# Patient Record
Sex: Female | Born: 1935
Health system: Southern US, Community
[De-identification: ages and names within clinical notes are randomized; demographics above are authoritative.]

## PROBLEM LIST (undated history)

## (undated) DIAGNOSIS — E079 Disorder of thyroid, unspecified: Secondary | ICD-10-CM

## (undated) DIAGNOSIS — I10 Essential (primary) hypertension: Secondary | ICD-10-CM

## (undated) DIAGNOSIS — R569 Unspecified convulsions: Secondary | ICD-10-CM

## (undated) DIAGNOSIS — K219 Gastro-esophageal reflux disease without esophagitis: Secondary | ICD-10-CM

## (undated) HISTORY — PX: ABDOMINAL HYSTERECTOMY: SHX81

## (undated) HISTORY — PX: GALLBLADDER SURGERY: SHX652

## (undated) HISTORY — DX: Gastro-esophageal reflux disease without esophagitis: K21.9

## (undated) HISTORY — DX: Disorder of thyroid, unspecified: E07.9

---

## 2001-06-10 ENCOUNTER — Emergency Department (HOSPITAL_COMMUNITY): Admission: EM | Admit: 2001-06-10 | Discharge: 2001-06-10 | Payer: Self-pay | Admitting: Emergency Medicine

## 2001-06-10 ENCOUNTER — Encounter: Payer: Self-pay | Admitting: Emergency Medicine

## 2015-01-19 ENCOUNTER — Ambulatory Visit (INDEPENDENT_AMBULATORY_CARE_PROVIDER_SITE_OTHER): Payer: PPO | Admitting: Family Medicine

## 2015-01-19 ENCOUNTER — Encounter: Payer: Self-pay | Admitting: Family Medicine

## 2015-01-19 VITALS — BP 151/77 | HR 79 | Temp 97.0°F | Ht 59.0 in | Wt 155.0 lb

## 2015-01-19 DIAGNOSIS — I10 Essential (primary) hypertension: Secondary | ICD-10-CM | POA: Diagnosis not present

## 2015-01-19 DIAGNOSIS — R7303 Prediabetes: Secondary | ICD-10-CM

## 2015-01-19 DIAGNOSIS — K2100 Gastro-esophageal reflux disease with esophagitis, without bleeding: Secondary | ICD-10-CM

## 2015-01-19 DIAGNOSIS — E039 Hypothyroidism, unspecified: Secondary | ICD-10-CM

## 2015-01-19 DIAGNOSIS — M545 Low back pain: Secondary | ICD-10-CM | POA: Diagnosis not present

## 2015-01-19 DIAGNOSIS — R7309 Other abnormal glucose: Secondary | ICD-10-CM

## 2015-01-19 LAB — POCT CBC
GRANULOCYTE PERCENT: 63.6 % (ref 37–80)
HCT, POC: 45 % (ref 37.7–47.9)
Hemoglobin: 14.1 g/dL (ref 12.2–16.2)
LYMPH, POC: 2.6 (ref 0.6–3.4)
MCH: 28 pg (ref 27–31.2)
MCHC: 31.3 g/dL — AB (ref 31.8–35.4)
MCV: 89.4 fL (ref 80–97)
MPV: 7.9 fL (ref 0–99.8)
PLATELET COUNT, POC: 301 10*3/uL (ref 142–424)
POC Granulocyte: 6 (ref 2–6.9)
POC LYMPH %: 27.6 % (ref 10–50)
RBC: 5.04 M/uL (ref 4.04–5.48)
RDW, POC: 14.4 %
WBC: 9.5 10*3/uL (ref 4.6–10.2)

## 2015-01-19 LAB — POCT GLYCOSYLATED HEMOGLOBIN (HGB A1C)

## 2015-01-19 MED ORDER — LEVOTHYROXINE SODIUM 50 MCG PO TABS: 50.0000 ug | ORAL_TABLET | Freq: Every day | ORAL | Status: DC

## 2015-01-19 MED ORDER — CYCLOBENZAPRINE HCL 10 MG PO TABS: 10.0000 mg | ORAL_TABLET | Freq: Three times a day (TID) | ORAL | Status: DC | PRN

## 2015-01-19 MED ORDER — SPIRONOLACTONE-HCTZ 25-25 MG PO TABS: 1.0000 | ORAL_TABLET | Freq: Every day | ORAL | Status: DC

## 2015-01-19 MED ORDER — ESOMEPRAZOLE MAGNESIUM 40 MG PO CPDR: 40.0000 mg | DELAYED_RELEASE_CAPSULE | Freq: Every day | ORAL | Status: DC

## 2015-01-19 NOTE — Progress Notes (Signed)
Subjective:  Patient ID: Samantha Woods, female    DOB: 03-30-1921  Age: 81 y.o. MRN: [id number]  CC: Establish Care   HPI Rease Swinson presents for Patient presents for follow-up on  thyroid. She has a history of hypothyroidism for many years. It has been stable recently. Pt. denies any change in  voice, loss of hair, heat or cold intolerance. Energy level has been adequate to good. She denies constipation and diarrhea. No myxedema. Medication is as noted below. Verified that pt is taking it daily on an empty stomach. Well tolerated.  Patient in for follow-up of hypertension. Patient has no history of headache chest pain or shortness of breath or recent cough. Patient also denies symptoms of TIA such as numbness weakness lateralizing. Patient checks  blood pressure at home and has not had any elevated readings recently. Patient denies side effects from his medication. States taking it regularly.  Patient in for follow-up of GERD.  currently asymptomatic taking  PPI daily. There is no chest pain or heartburn. taking the medication regularly. No dysphagia or choking.  Patient has a history of arthritis in the lower back. She has moderate to severe pain for which she has used opiates in the past.  Patient has been told that blood sugar was borderline in the past. Not high enough for medication but not in the normal range. She denies polyuria polydipsia.   History Smita has no past medical history on file.   She has no past surgical history on file.   Her family history is not on file.She reports that she has never smoked. She does not have any smokeless tobacco history on file. She reports that she drinks alcohol. She reports that she does not use illicit drugs.  No current outpatient prescriptions on file prior to visit.   No current facility-administered medications on file prior to visit.    ROS Review of Systems  Objective:  BP 151/77 mmHg  Pulse 79  Temp(Src) 97 F (36.1  C) (Oral)  Ht _0  (1.499 m)  Wt 155 lb (70.308 kg)  BMI 31.29 kg/m2  BP Readings from Last 3 Encounters:  01/19/15 151/77    Wt Readings from Last 3 Encounters:  01/19/15 155 lb (70.308 kg)     Physical Exam  Lab Results  Component Value Date   HGBA1C 5.7% 01/19/2015    Lab Results  Component Value Date   WBC 9.5 01/19/2015   HGB 14.1 01/19/2015   HCT 45.0 01/19/2015   GLUCOSE 94 01/19/2015   CHOL 179 01/19/2015   TRIG 137 01/19/2015   HDL 46 01/19/2015   LDLCALC 106* 01/19/2015   ALT 12 01/19/2015   AST 21 01/19/2015   NA 141 01/19/2015   K 4.2 01/19/2015   CL 97 01/19/2015   CREATININE 0.89 01/19/2015   BUN 21 01/19/2015   CO2 27 01/19/2015   TSH 4.770* 01/19/2015   HGBA1C 5.7% 01/19/2015    Dg Chest 2 View  06/10/2001   FINDINGS CLINICAL DATA:  CHEST PAIN/COUGH. TWO VIEW CHEST NO PRIOR EXAMS FOR COMPARISON.  HEART SLIGHTLY ENLARGED WITH A SOMEWHAT PROMINENT LEFT VENTRICULAR CONTOUR AND TORTUOUS AORTA.  NO CONGESTIVE HEART FAILURE OR ACTIVE DISEASE.  LUNGS CLEAR.  THERE MAY BE A MILD DEGREE OF HYPERAERATION RAISING THE QUESTION OF EARLY COPD. AN ADDITIONAL FINDING IS A SMALL CALCIFIED GRANULOMA IN THE LEFT UPPER LOBE.  OSSEOUS STRUCTURES INTACT WITH BONY DEMINERALIZATION CONSISTENT WITH THE PATIENT'S AGE. IMPRESSION 1.  PROMINENT LEFT VENTRICLE AND  AORTA - NO ACTIVE DISEASE. 2.  CHRONIC LUNG CHANGES AS DESCRIBED ABOVE.   Assessment & Plan:   Leanora was seen today for establish care.  Diagnoses and all orders for this visit:  Hypothyroidism, unspecified hypothyroidism type Orders: -     CMP14+EGFR -     TSH -     POCT CBC -     T4, Free  Gastroesophageal reflux disease with esophagitis Orders: -     CMP14+EGFR -     POCT CBC  Essential hypertension Orders: -     CMP14+EGFR -     POCT CBC  Low back pain without sciatica, unspecified back pain laterality Orders: -     CMP14+EGFR -     POCT CBC  Prediabetes Orders: -     CMP14+EGFR -      Lipid panel -     POCT CBC -     POCT glycosylated hemoglobin (Hb A1C)  Other orders -     cyclobenzaprine (FLEXERIL) 10 MG tablet; Take 1 tablet (10 mg total) by mouth 3 (three) times daily as needed for muscle spasms. -     esomeprazole (NEXIUM) 40 MG capsule; Take 1 capsule (40 mg total) by mouth daily at 12 noon. -     Discontinue: levothyroxine (SYNTHROID, LEVOTHROID) 50 MCG tablet; Take 1 tablet (50 mcg total) by mouth daily before breakfast. -     spironolactone-hydrochlorothiazide (ALDACTAZIDE) 25-25 MG per tablet; Take 1 tablet by mouth daily.   I have discontinued Ms. Caccavale levothyroxine. I have also changed her cyclobenzaprine and esomeprazole. Additionally, I am having her maintain her HYDROcodone-acetaminophen, docusate sodium, Fish Oil, aspirin EC, loratadine, Vitamin D, acetaminophen, and spironolactone-hydrochlorothiazide.  Meds ordered this encounter  Medications  . DISCONTD: cyclobenzaprine (FLEXERIL) 10 MG tablet    Sig: Take 10 mg by mouth 3 (three) times daily as needed for muscle spasms.  Marland Kitchen HYDROcodone-acetaminophen (NORCO/VICODIN) 5-325 MG per tablet    Sig: Take 1 tablet by mouth every 6 (six) hours as needed for moderate pain.  Marland Kitchen docusate sodium (COLACE) 100 MG capsule    Sig: Take 100 mg by mouth 2 (two) times daily.  Marland Kitchen DISCONTD: esomeprazole (NEXIUM) 40 MG capsule    Sig: Take 40 mg by mouth daily at 12 noon.  Marland Kitchen DISCONTD: spironolactone-hydrochlorothiazide (ALDACTAZIDE) 25-25 MG per tablet    Sig: Take 1 tablet by mouth daily.  . Omega-3 Fatty Acids (FISH OIL) 1000 MG CAPS    Sig: Take 1 capsule by mouth daily.  Marland Kitchen aspirin EC 81 MG tablet    Sig: Take 81 mg by mouth daily.  Marland Kitchen DISCONTD: levothyroxine (SYNTHROID, LEVOTHROID) 50 MCG tablet    Sig: Take 50 mcg by mouth daily before breakfast.  . loratadine (CLARITIN) 10 MG tablet    Sig: Take 10 mg by mouth daily.  . Cholecalciferol (VITAMIN D) 2000 UNITS CAPS    Sig: Take 1 capsule by mouth.  Marland Kitchen  acetaminophen (TYLENOL) 325 MG tablet    Sig: Take 650 mg by mouth every 4 (four) hours as needed.  . cyclobenzaprine (FLEXERIL) 10 MG tablet    Sig: Take 1 tablet (10 mg total) by mouth 3 (three) times daily as needed for muscle spasms.    Dispense:  90 tablet    Refill:  1  . esomeprazole (NEXIUM) 40 MG capsule    Sig: Take 1 capsule (40 mg total) by mouth daily at 12 noon.    Dispense:  90 capsule    Refill:  3  . DISCONTD: levothyroxine (SYNTHROID, LEVOTHROID) 50 MCG tablet    Sig: Take 1 tablet (50 mcg total) by mouth daily before breakfast.    Dispense:  90 tablet    Refill:  3  . spironolactone-hydrochlorothiazide (ALDACTAZIDE) 25-25 MG per tablet    Sig: Take 1 tablet by mouth daily.    Dispense:  90 tablet    Refill:  3    Follow-up: Return in about 3 months (around 04/19/2015).  Claretta Fraise, M.D.

## 2015-01-20 LAB — LIPID PANEL
Chol/HDL Ratio: 3.9 ratio units (ref 0.0–4.4)
Cholesterol, Total: 179 mg/dL (ref 100–199)
HDL: 46 mg/dL (ref >39)
LDL Calculated: 106 mg/dL — ABNORMAL HIGH (ref 0–99)
TRIGLYCERIDES: 137 mg/dL (ref 0–149)
VLDL CHOLESTEROL CAL: 27 mg/dL (ref 5–40)

## 2015-01-20 LAB — CMP14+EGFR
A/G RATIO: 1.9 (ref 1.1–2.5)
ALBUMIN: 4.4 g/dL (ref 3.2–4.6)
ALT: 12 IU/L (ref 0–32)
AST: 21 IU/L (ref 0–40)
Alkaline Phosphatase: 79 IU/L (ref 39–117)
BILIRUBIN TOTAL: 0.9 mg/dL (ref 0.0–1.2)
BUN/Creatinine Ratio: 24 (ref 11–26)
BUN: 21 mg/dL (ref 10–36)
CO2: 27 mmol/L (ref 18–29)
Calcium: 10 mg/dL (ref 8.7–10.3)
Chloride: 97 mmol/L (ref 97–108)
Creatinine, Ser: 0.89 mg/dL (ref 0.57–1.00)
GFR, EST AFRICAN AMERICAN: 65 mL/min/{1.73_m2} (ref >59)
GFR, EST NON AFRICAN AMERICAN: 56 mL/min/{1.73_m2} — AB (ref >59)
GLUCOSE: 94 mg/dL (ref 65–99)
Globulin, Total: 2.3 g/dL (ref 1.5–4.5)
POTASSIUM: 4.2 mmol/L (ref 3.5–5.2)
Sodium: 141 mmol/L (ref 134–144)
TOTAL PROTEIN: 6.7 g/dL (ref 6.0–8.5)

## 2015-01-20 LAB — T4, FREE: Free T4: 1.24 ng/dL (ref 0.82–1.77)

## 2015-01-20 LAB — TSH: TSH: 4.77 u[IU]/mL — AB (ref 0.450–4.500)

## 2015-01-21 ENCOUNTER — Other Ambulatory Visit: Payer: Self-pay | Admitting: Family Medicine

## 2015-01-21 MED ORDER — LEVOTHYROXINE SODIUM 75 MCG PO TABS: 75.0000 ug | ORAL_TABLET | Freq: Every day | ORAL | Status: DC

## 2015-01-23 DIAGNOSIS — R7303 Prediabetes: Secondary | ICD-10-CM | POA: Insufficient documentation

## 2015-01-23 DIAGNOSIS — I10 Essential (primary) hypertension: Secondary | ICD-10-CM | POA: Insufficient documentation

## 2015-01-23 DIAGNOSIS — K2100 Gastro-esophageal reflux disease with esophagitis, without bleeding: Secondary | ICD-10-CM | POA: Insufficient documentation

## 2015-01-23 DIAGNOSIS — M479 Spondylosis, unspecified: Secondary | ICD-10-CM | POA: Insufficient documentation

## 2015-01-23 DIAGNOSIS — E039 Hypothyroidism, unspecified: Secondary | ICD-10-CM | POA: Insufficient documentation

## 2015-03-25 ENCOUNTER — Telehealth: Payer: Self-pay | Admitting: Family Medicine

## 2015-03-25 MED ORDER — LEVOTHYROXINE SODIUM 75 MCG PO TABS: 75.0000 ug | ORAL_TABLET | Freq: Every day | ORAL | Status: DC

## 2015-03-25 NOTE — Telephone Encounter (Signed)
Prescription sent. Thanks, WS.

## 2015-03-28 ENCOUNTER — Other Ambulatory Visit: Payer: Self-pay

## 2015-03-28 MED ORDER — LEVOTHYROXINE SODIUM 75 MCG PO TABS: 75.0000 ug | ORAL_TABLET | Freq: Every day | ORAL | Status: DC

## 2015-03-28 NOTE — Telephone Encounter (Signed)
Detailed message left that rx sent to pharmacy. ?

## 2015-05-06 ENCOUNTER — Encounter: Payer: Self-pay | Admitting: *Deleted

## 2015-06-20 ENCOUNTER — Encounter: Payer: Self-pay | Admitting: Family

## 2015-06-20 ENCOUNTER — Encounter (INDEPENDENT_AMBULATORY_CARE_PROVIDER_SITE_OTHER): Payer: Self-pay

## 2015-06-20 ENCOUNTER — Ambulatory Visit (INDEPENDENT_AMBULATORY_CARE_PROVIDER_SITE_OTHER): Payer: PPO

## 2015-06-20 ENCOUNTER — Ambulatory Visit (INDEPENDENT_AMBULATORY_CARE_PROVIDER_SITE_OTHER): Payer: PPO | Admitting: Family

## 2015-06-20 VITALS — BP 143/73 | HR 71 | Temp 97.1°F | Ht 59.0 in | Wt 149.0 lb

## 2015-06-20 DIAGNOSIS — S92911A Unspecified fracture of right toe(s), initial encounter for closed fracture: Secondary | ICD-10-CM

## 2015-06-20 DIAGNOSIS — M79674 Pain in right toe(s): Secondary | ICD-10-CM | POA: Diagnosis not present

## 2015-06-20 NOTE — Progress Notes (Signed)
   Subjective:    Patient ID: Samantha Woods, female    DOB: March 26, 1921, 81 y.o.   MRN: [id number]  HPI Pt presents to the office today for pain in her right second toe. Pt states she dropped a can of food on her toe about two week ago. Pt states her toe became very red and swollen. Pt denies any pain. Pt has soaked it in epsom salt with mild relief.     Review of Systems  Constitutional: Negative.   HENT: Negative.   Eyes: Negative.   Respiratory: Negative.  Negative for shortness of breath.   Cardiovascular: Negative.  Negative for palpitations.  Gastrointestinal: Negative.   Endocrine: Negative.   Genitourinary: Negative.   Musculoskeletal: Negative.   Neurological: Negative.  Negative for headaches.  Hematological: Negative.   Psychiatric/Behavioral: Negative.   All other systems reviewed and are negative.      Objective:   Physical Exam  Constitutional: She is oriented to person, place, and time. She appears well-developed and well-nourished. No distress.  HENT:  Head: Normocephalic and atraumatic.  Eyes: Pupils are equal, round, and reactive to light.  Neck: Normal range of motion. Neck supple. No thyromegaly present.  Cardiovascular: Normal rate, regular rhythm, normal heart sounds and intact distal pulses.   No murmur heard. Pulmonary/Chest: Effort normal and breath sounds normal. No respiratory distress. She has no wheezes.  Abdominal: Soft. Bowel sounds are normal. She exhibits no distension. There is no tenderness.  Musculoskeletal: Normal range of motion. She exhibits edema (mild swelling of second right toe). She exhibits no tenderness.  Neurological: She is alert and oriented to person, place, and time. She has normal reflexes. No cranial nerve deficit.  Skin: Skin is warm and dry.  Psychiatric: She has a normal mood and affect. Her behavior is normal. Judgment and thought content normal.  Vitals reviewed.   BP 143/73 mmHg  Pulse 71  Temp(Src) 97.1 F (36.2  C) (Oral)  Ht 4\' 11"  (1.499 m)  Wt 149 lb (67.586 kg)  BMI 30.08 kg/m2  X-ray-Fracture of second right toe Preliminary reading by Evelina Dun, FNP [facility]      Assessment & Plan:  1. Pain of toe of right foot - DG Toe 2nd Right; Future  2. Fracture, toe, right, closed, initial encounter -Second toe buddy taped to third toe-Gauze placed in between -Keep elevated -Discussed checking circulation- Not too tight for buddy taping -Tylenol prn for pain -RTO prn  Evelina Dun, FNP

## 2015-06-20 NOTE — Patient Instructions (Signed)
Toe Fracture Your caregiver has diagnosed you as having a fractured toe. A toe fracture is a break in the bone of a toe. "Buddy taping" is a way of splinting your broken toe, by taping the broken toe to the toe next to it. This "buddy taping" will keep the injured toe from moving beyond normal range of motion. Buddy taping also helps the toe heal in a more normal alignment. It may take 6 to 8 weeks for the toe injury to heal. [place] your toes taped together for as long as directed by your caregiver or until you see a doctor for a follow-up examination. You can change the tape after bathing. Always use a small piece of gauze or cotton between the toes when taping them together. This will help the skin stay dry and prevent infection.  Apply ice to the injury for 15-20 minutes each hour while awake for the first 2 days. Put the ice in a plastic bag and place a towel between the bag of ice and your skin.  After the first 2 days, apply heat to the injured area. Use heat for the next 2 to 3 days. Place a heating pad on the foot or soak the foot in warm water as directed by your caregiver.  Keep your foot elevated as much as possible to lessen swelling.  Wear sturdy, supportive shoes. The shoes should not pinch the toes or fit tightly against the toes.  Your caregiver may prescribe a rigid shoe if your foot is very swollen.  Your may be given crutches if the pain is too great and it hurts too much to walk.  Only take over-the-counter or prescription medicines for pain, discomfort, or fever as directed by your caregiver.  If your caregiver has given you a follow-up appointment, it is very important to keep that appointment. Not keeping the appointment could result in a chronic or permanent injury, pain, and disability. If there is any problem keeping the appointment, you must call back to this facility for assistance. SEEK MEDICAL CARE IF:   You have increased pain or swelling,  not relieved with medications.  The pain does not get better after 1 week.  Your injured toe is cold when the others are warm. SEEK IMMEDIATE MEDICAL CARE IF:   The toe becomes cold, numb, or white.  The toe becomes hot (inflamed) and red. Document Released: 11/09/2000 Document Revised: 02/04/2012 Document Reviewed: 06/28/2008 [facility] Patient Information 2015 [place], [place]. This information is not intended to replace advice given to you by your health care provider. Make sure you discuss any questions you have with your health care provider. Buddy Taping of Toes We have taped your toes together to keep them from moving. This is called "buddy taping" since we used a part of your own body to keep the injured part still. We placed soft padding between your toes to keep them from rubbing against each other. Buddy taping will help with healing and to reduce pain. Keep your toes buddy taped together for as long as directed by your caregiver. HOME CARE INSTRUCTIONS   Raise your injured area above the level of your heart while sitting or lying down. Prop it up with pillows.  An ice pack used every twenty minutes, while awake, for the first one to two days may be helpful. Put ice in a plastic bag and put a towel between the bag and your skin.  Watch for signs that the taping is  too tight. These signs may be:  Numbness of your taped toes.  Coolness of your taped toes.  Color change in the area beyond the tape.  Increased pain.  If you have any of these signs, loosen or rewrap the tape. If you need to loosen or rewrap the buddy tape, make sure you use the padding again. SEEK IMMEDIATE MEDICAL CARE IF:   You have worse pain, swelling, inflammation (soreness), drainage or bleeding after you rewrap the tape.  Any new problems occur. MAKE SURE YOU:   Understand these instructions.  Will watch your condition.  Will get help right away if you are not doing well or get worse. Document  Released: 08/16/2004 Document Revised: 02/04/2012 Document Reviewed: 11/09/2008 [facility] Patient Information 2015 [place], [place]. This information is not intended to replace advice given to you by your health care provider. Make sure you discuss any questions you have with your health care provider.

## 2015-07-21 ENCOUNTER — Ambulatory Visit: Payer: PPO | Admitting: Family Medicine

## 2015-09-14 ENCOUNTER — Ambulatory Visit (INDEPENDENT_AMBULATORY_CARE_PROVIDER_SITE_OTHER): Payer: PPO | Admitting: Family Medicine

## 2015-09-14 ENCOUNTER — Ambulatory Visit (INDEPENDENT_AMBULATORY_CARE_PROVIDER_SITE_OTHER): Payer: PPO

## 2015-09-14 ENCOUNTER — Encounter: Payer: Self-pay | Admitting: Family Medicine

## 2015-09-14 VITALS — BP 128/74 | HR 69 | Temp 97.2°F | Ht 59.0 in | Wt 151.8 lb

## 2015-09-14 DIAGNOSIS — Z23 Encounter for immunization: Secondary | ICD-10-CM | POA: Diagnosis not present

## 2015-09-14 DIAGNOSIS — M810 Age-related osteoporosis without current pathological fracture: Secondary | ICD-10-CM

## 2015-09-14 DIAGNOSIS — I10 Essential (primary) hypertension: Secondary | ICD-10-CM

## 2015-09-14 DIAGNOSIS — M150 Primary generalized (osteo)arthritis: Secondary | ICD-10-CM

## 2015-09-14 DIAGNOSIS — R7303 Prediabetes: Secondary | ICD-10-CM

## 2015-09-14 DIAGNOSIS — M159 Polyosteoarthritis, unspecified: Secondary | ICD-10-CM

## 2015-09-14 DIAGNOSIS — E038 Other specified hypothyroidism: Secondary | ICD-10-CM | POA: Diagnosis not present

## 2015-09-14 LAB — POCT GLYCOSYLATED HEMOGLOBIN (HGB A1C): Hemoglobin A1C: 5.6

## 2015-09-14 NOTE — Progress Notes (Signed)
Subjective:  Patient ID: Samantha Woods, female    DOB: 1921/09/19  Age: 81 y.o. MRN: [id number]  CC: Hypertension; Hypothyroidism; and Gastroesophageal Reflux   HPI Samantha Woods presents for  follow-up of hypertension. Patient has no history of headache chest pain or shortness of breath or recent cough. Patient also denies symptoms of TIA such as numbness weakness lateralizing. Patient checks  blood pressure at home and has not had any elevated readings recently. Patient denies side effects from his medication. States taking it regularly.  Patient in for follow-up of GERD. Currently asymptomatic taking  PPI daily. There is no chest pain or heartburn. No hematemesis and no melena. No dysphagia or choking. Onset is remote. Progression is stable. Complicating factors, none.   Patient presents for follow-up on  thyroid. She has a history of hypothyroidism for many years. It has been stable recently. Pt. denies any change in  voice, loss of hair, heat or cold intolerance. Energy level has been adequate to good. She denies constipation and diarrhea. No myxedema. Medication is as noted below. Verified that pt is taking it daily on an empty stomach. Well tolerated.  Patient does have occasional cough in the cold weather. Her previous physician gave her a bottle of Cheratussin that lasted 15 months. She brings it in. It is dated 07/10/2014 she would like to have a refill.  She was diagnosed with prediabetes in the past. She does not have any polyuria or polydipsia but would like to have blood sugar checked.   History Samantha Woods has a past medical history of Thyroid disease and GERD (gastroesophageal reflux disease).   She has past surgical history that includes Abdominal hysterectomy.   Her family history is not on file.She reports that she has never smoked. She has never used smokeless tobacco. She reports that she drinks alcohol. She reports that she does not use illicit drugs.  Current  Outpatient Prescriptions on File Prior to Visit  Medication Sig Dispense Refill  . acetaminophen (TYLENOL) 325 MG tablet Take 650 mg by mouth every 4 (four) hours as needed.    Marland Kitchen aspirin EC 81 MG tablet Take 81 mg by mouth daily.    . Cholecalciferol (VITAMIN D) 2000 UNITS CAPS Take 1 capsule by mouth.    . docusate sodium (COLACE) 100 MG capsule Take 100 mg by mouth 2 (two) times daily.    Marland Kitchen esomeprazole (NEXIUM) 40 MG capsule Take 1 capsule (40 mg total) by mouth daily at 12 noon. 90 capsule 3  . levothyroxine (SYNTHROID, LEVOTHROID) 75 MCG tablet Take 1 tablet (75 mcg total) by mouth daily before breakfast. 30 tablet 10  . Omega-3 Fatty Acids (FISH OIL) 1000 MG CAPS Take 1 capsule by mouth daily.    Marland Kitchen spironolactone-hydrochlorothiazide (ALDACTAZIDE) 25-25 MG per tablet Take 1 tablet by mouth daily. 90 tablet 3   No current facility-administered medications on file prior to visit.    ROS Review of Systems  Constitutional: Negative for fever, chills, diaphoresis, appetite change, fatigue and unexpected weight change.  HENT: Negative for congestion, ear pain, hearing loss, postnasal drip, rhinorrhea, sneezing, sore throat and trouble swallowing.   Eyes: Negative for pain.  Respiratory: Negative for cough, chest tightness and shortness of breath.   Cardiovascular: Negative for chest pain and palpitations.  Gastrointestinal: Negative for nausea, vomiting, abdominal pain, diarrhea and constipation.  Genitourinary: Negative for dysuria, frequency and menstrual problem.  Musculoskeletal: Negative for joint swelling and arthralgias.  Skin: Negative for rash.  Neurological: Negative for dizziness,  weakness, numbness and headaches.  Psychiatric/Behavioral: Negative for dysphoric mood and agitation.    Objective:  BP 128/74 mmHg  Pulse 69  Temp(Src) 97.2 F (36.2 C) (Oral)  Ht _0  (1.499 m)  Wt 151 lb 12.8 oz (68.856 kg)  BMI 30.64 kg/m2  BP Readings from Last 3 Encounters:  09/14/15  128/74  06/20/15 143/73  01/19/15 151/77    Wt Readings from Last 3 Encounters:  09/14/15 151 lb 12.8 oz (68.856 kg)  06/20/15 149 lb (67.586 kg)  01/19/15 155 lb (70.308 kg)     Physical Exam  Constitutional: She is oriented to person, place, and time. She appears well-developed and well-nourished. No distress.  HENT:  Head: Normocephalic and atraumatic.  Right Ear: External ear normal.  Left Ear: External ear normal.  Nose: Nose normal.  Mouth/Throat: Oropharynx is clear and moist.  Eyes: Conjunctivae and EOM are normal. Pupils are equal, round, and reactive to light.  Neck: Normal range of motion. Neck supple. No thyromegaly present.  Cardiovascular: Normal rate, regular rhythm and normal heart sounds.   No murmur heard. Pulmonary/Chest: Effort normal and breath sounds normal. No respiratory distress. She has no wheezes. She has no rales.  Abdominal: Soft. Bowel sounds are normal. She exhibits no distension. There is no tenderness.  Musculoskeletal: Normal range of motion.  Marked dorsal kyphosis noted.  Lymphadenopathy:    She has no cervical adenopathy.  Neurological: She is alert and oriented to person, place, and time. She has normal reflexes.  Skin: Skin is warm and dry.  Psychiatric: She has a normal mood and affect. Her behavior is normal. Judgment and thought content normal.    Lab Results  Component Value Date   HGBA1C 5.7% 01/19/2015    Lab Results  Component Value Date   WBC 9.5 01/19/2015   HGB 14.1 01/19/2015   HCT 45.0 01/19/2015   GLUCOSE 94 01/19/2015   CHOL 179 01/19/2015   TRIG 137 01/19/2015   HDL 46 01/19/2015   LDLCALC 106* 01/19/2015   ALT 12 01/19/2015   AST 21 01/19/2015   NA 141 01/19/2015   K 4.2 01/19/2015   CL 97 01/19/2015   CREATININE 0.89 01/19/2015   BUN 21 01/19/2015   CO2 27 01/19/2015   TSH 4.770* 01/19/2015   HGBA1C 5.7% 01/19/2015    Dg Chest 2 View  06/10/2001  FINDINGS CLINICAL DATA:  CHEST PAIN/COUGH. TWO VIEW  CHEST NO PRIOR EXAMS FOR COMPARISON.  HEART SLIGHTLY ENLARGED WITH A SOMEWHAT PROMINENT LEFT VENTRICULAR CONTOUR AND TORTUOUS AORTA.  NO CONGESTIVE HEART FAILURE OR ACTIVE DISEASE.  LUNGS CLEAR.  THERE MAY BE A MILD DEGREE OF HYPERAERATION RAISING THE QUESTION OF EARLY COPD. AN ADDITIONAL FINDING IS A SMALL CALCIFIED GRANULOMA IN THE LEFT UPPER LOBE.  OSSEOUS STRUCTURES INTACT WITH BONY DEMINERALIZATION CONSISTENT WITH THE PATIENT'S AGE. IMPRESSION 1.  PROMINENT LEFT VENTRICLE AND AORTA - NO ACTIVE DISEASE. 2.  CHRONIC LUNG CHANGES AS DESCRIBED ABOVE.   Assessment & Plan:   Samantha Woods was seen today for hypertension, hypothyroidism and gastroesophageal reflux.  Diagnoses and all orders for this visit:  Essential hypertension -     CBC with Differential/Platelet -     CMP14+EGFR  Prediabetes  Other specified hypothyroidism  Encounter for immunization  Other orders -     Flu Vaccine QUAD 36+ mos IM   I am having Samantha Woods maintain her docusate sodium, Fish Oil, aspirin EC, Vitamin D, acetaminophen, esomeprazole, spironolactone-hydrochlorothiazide, and levothyroxine.  No orders of the defined  types were placed in this encounter.     Follow-up: No Follow-up on file.  Claretta Fraise, M.D.

## 2015-09-14 NOTE — Addendum Note (Signed)
Addended by: Claretta Fraise on: 09/14/2015 11:19 AM   Modules accepted: Miquel Dunn

## 2015-09-14 NOTE — Patient Instructions (Signed)
Be sure you take calcium 500 mg twice daily in addition to one or more servings of a dairy or other high calcium food.  Be sure you take 2000 units of vitamin D minimum daily as well. This should help with the osteoporosis.

## 2015-09-15 LAB — CBC WITH DIFFERENTIAL/PLATELET
BASOS ABS: 0.1 10*3/uL (ref 0.0–0.2)
Basos: 1 %
EOS (ABSOLUTE): 0.3 10*3/uL (ref 0.0–0.4)
EOS: 3 %
HEMATOCRIT: 43.3 % (ref 34.0–46.6)
HEMOGLOBIN: 14.8 g/dL (ref 11.1–15.9)
Immature Grans (Abs): 0 10*3/uL (ref 0.0–0.1)
Immature Granulocytes: 0 %
Lymphocytes Absolute: 2.5 10*3/uL (ref 0.7–3.1)
Lymphs: 34 %
MCH: 30.6 pg (ref 26.6–33.0)
MCHC: 34.2 g/dL (ref 31.5–35.7)
MCV: 90 fL (ref 79–97)
MONOCYTES: 10 %
Monocytes Absolute: 0.7 10*3/uL (ref 0.1–0.9)
NEUTROS ABS: 3.9 10*3/uL (ref 1.4–7.0)
Neutrophils: 52 %
Platelets: 286 10*3/uL (ref 150–379)
RBC: 4.83 x10E6/uL (ref 3.77–5.28)
RDW: 14.4 % (ref 12.3–15.4)
WBC: 7.5 10*3/uL (ref 3.4–10.8)

## 2015-09-15 LAB — T4, FREE: Free T4: 1.63 ng/dL (ref 0.82–1.77)

## 2015-09-15 LAB — CMP14+EGFR
ALBUMIN: 4.4 g/dL (ref 3.2–4.6)
ALT: 12 IU/L (ref 0–32)
AST: 19 IU/L (ref 0–40)
Albumin/Globulin Ratio: 1.7 (ref 1.1–2.5)
Alkaline Phosphatase: 81 IU/L (ref 39–117)
BILIRUBIN TOTAL: 0.7 mg/dL (ref 0.0–1.2)
BUN / CREAT RATIO: 15 (ref 11–26)
BUN: 14 mg/dL (ref 10–36)
CO2: 27 mmol/L (ref 18–29)
CREATININE: 0.95 mg/dL (ref 0.57–1.00)
Calcium: 9.6 mg/dL (ref 8.7–10.3)
Chloride: 95 mmol/L — ABNORMAL LOW (ref 97–106)
GFR calc non Af Amer: 52 mL/min/{1.73_m2} — ABNORMAL LOW (ref >59)
GFR, EST AFRICAN AMERICAN: 60 mL/min/{1.73_m2} (ref >59)
Globulin, Total: 2.6 g/dL (ref 1.5–4.5)
Glucose: 96 mg/dL (ref 65–99)
Potassium: 3.7 mmol/L (ref 3.5–5.2)
Sodium: 141 mmol/L (ref 136–144)
TOTAL PROTEIN: 7 g/dL (ref 6.0–8.5)

## 2015-09-15 LAB — TSH: TSH: 1.25 u[IU]/mL (ref 0.450–4.500)

## 2015-12-08 ENCOUNTER — Telehealth: Payer: Self-pay | Admitting: Family Medicine

## 2015-12-08 NOTE — Telephone Encounter (Signed)
scheduled

## 2015-12-28 ENCOUNTER — Other Ambulatory Visit: Payer: Self-pay | Admitting: Family Medicine

## 2016-01-12 ENCOUNTER — Ambulatory Visit (INDEPENDENT_AMBULATORY_CARE_PROVIDER_SITE_OTHER): Payer: PPO | Admitting: Family Medicine

## 2016-01-12 ENCOUNTER — Encounter: Payer: Self-pay | Admitting: Family Medicine

## 2016-01-12 VITALS — BP 122/74 | HR 78 | Temp 97.0°F | Ht 59.0 in | Wt 148.0 lb

## 2016-01-12 DIAGNOSIS — J019 Acute sinusitis, unspecified: Secondary | ICD-10-CM

## 2016-01-12 MED ORDER — FLUTICASONE PROPIONATE 50 MCG/ACT NA SUSP: 1.0000 | Freq: Two times a day (BID) | NASAL | Status: DC | PRN

## 2016-01-12 NOTE — Progress Notes (Signed)
BP 122/74 mmHg  Pulse 78  Temp(Src) 97 F (36.1 C) (Oral)  Ht 4\' 11"  (1.499 m)  Wt 148 lb (67.132 kg)  BMI 29.88 kg/m2   Subjective:    Patient ID: Samantha Woods, female    DOB: [birth year], 82 y.o.   MRN: [id number]  HPI: Samantha Woods is a 82 y.o. female presenting on 01/12/2016 for Episodes of dizziness and Sinusitis   HPI Sinus congestion and dizziness Patient comes in today with complaints of sinus congestion and dizziness that has been going on for about a week. The dizziness just happened once but the sinus congestion and postnasal drainage and pressure in ears is within over the past week. She denies any fevers or chills. She does have postnasal drainage and congestion and has been using Alka-Seltzer for which helped some but not completely. The episode of dizziness happened when she was bending over to cut a cake and she felt dizzy and off balance like she could fall down but she caught herself. She denies any loss of consciousness or spinning sensation. Her son was recently ill with bronchitis and congestion.  Relevant past medical, surgical, family and social history reviewed and updated as indicated. Interim medical history since our last visit reviewed. Allergies and medications reviewed and updated.  Review of Systems  Constitutional: Negative for fever and chills.  HENT: Positive for postnasal drip, rhinorrhea, sinus pressure and sore throat. Negative for congestion, ear discharge, ear pain and sneezing.   Eyes: Negative for pain, redness and visual disturbance.  Respiratory: Positive for cough. Negative for chest tightness and shortness of breath.   Cardiovascular: Negative for chest pain and leg swelling.  Genitourinary: Negative for dysuria and difficulty urinating.  Musculoskeletal: Negative for back pain and gait problem.  Skin: Negative for rash.  Neurological: Positive for dizziness. Negative for light-headedness and headaches.  Psychiatric/Behavioral:  Negative for behavioral problems and agitation.  All other systems reviewed and are negative.   Per HPI unless specifically indicated above     Medication List       This list is accurate as of: 01/12/16  9:17 AM.  Always use your most recent med list.               acetaminophen 325 MG tablet  Commonly known as:  TYLENOL  Take 650 mg by mouth every 4 (four) hours as needed.     aspirin EC 81 MG tablet  Take 81 mg by mouth daily.     docusate sodium 100 MG capsule  Commonly known as:  COLACE  Take 100 mg by mouth 2 (two) times daily.     esomeprazole 40 MG capsule  Commonly known as:  NEXIUM  Take 1 capsule (40 mg total) by mouth daily at 12 noon.     Fish Oil 1000 MG Caps  Take 1 capsule by mouth daily.     fluticasone 50 MCG/ACT nasal spray  Commonly known as:  FLONASE  Place 1 spray into both nostrils 2 (two) times daily as needed for allergies or rhinitis.     levothyroxine 75 MCG tablet  Commonly known as:  SYNTHROID, LEVOTHROID  Take 1 tablet (75 mcg total) by mouth daily before breakfast.     spironolactone-hydrochlorothiazide 25-25 MG tablet  Commonly known as:  ALDACTAZIDE  TAKE ONE TABLET BY MOUTH ONCE DAILY     Vitamin D 2000 units Caps  Take 1 capsule by mouth.           Objective:  BP 122/74 mmHg  Pulse 78  Temp(Src) 97 F (36.1 C) (Oral)  Ht 4\' 11"  (1.499 m)  Wt 148 lb (67.132 kg)  BMI 29.88 kg/m2  Wt Readings from Last 3 Encounters:  01/12/16 148 lb (67.132 kg)  09/14/15 151 lb 12.8 oz (68.856 kg)  06/20/15 149 lb (67.586 kg)    Physical Exam  Constitutional: She is oriented to person, place, and time. She appears well-developed and well-nourished. No distress.  HENT:  Right Ear: Tympanic membrane, external ear and ear canal normal.  Left Ear: Tympanic membrane, external ear and ear canal normal.  Nose: Mucosal edema and rhinorrhea present. No epistaxis. Right sinus exhibits no maxillary sinus tenderness and no frontal sinus  tenderness. Left sinus exhibits no maxillary sinus tenderness and no frontal sinus tenderness.  Mouth/Throat: Uvula is midline and mucous membranes are normal. Posterior oropharyngeal edema and posterior oropharyngeal erythema present. No oropharyngeal exudate or tonsillar abscesses.  Eyes: Conjunctivae and EOM are normal.  Cardiovascular: Normal rate, regular rhythm, normal heart sounds and intact distal pulses.   No murmur heard. Pulmonary/Chest: Effort normal and breath sounds normal. No respiratory distress. She has no wheezes.  Musculoskeletal: Normal range of motion. She exhibits no edema or tenderness.  Neurological: She is alert and oriented to person, place, and time. Coordination normal.  Skin: Skin is warm and dry. No rash noted. She is not diaphoretic.  Psychiatric: She has a normal mood and affect. Her behavior is normal.  Nursing note and vitals reviewed.     Assessment & Plan:   Problem List Items Addressed This Visit    None    Visit Diagnoses    Acute rhinosinusitis    -  Primary    Try Flonase and Mucinex to see if she can clear up the pressure. If worsens or does not improve then return    Relevant Medications    fluticasone (FLONASE) 50 MCG/ACT nasal spray        Follow up plan: Return if symptoms worsen or fail to improve.  Counseling provided for all of the vaccine components No orders of the defined types were placed in this encounter.    Caryl Pina, MD [facility] 01/12/2016, 9:17 AM

## 2016-03-06 DIAGNOSIS — Z961 Presence of intraocular lens: Secondary | ICD-10-CM | POA: Diagnosis not present

## 2016-03-06 DIAGNOSIS — H524 Presbyopia: Secondary | ICD-10-CM | POA: Diagnosis not present

## 2016-03-06 DIAGNOSIS — H02839 Dermatochalasis of unspecified eye, unspecified eyelid: Secondary | ICD-10-CM | POA: Diagnosis not present

## 2016-03-06 DIAGNOSIS — H18412 Arcus senilis, left eye: Secondary | ICD-10-CM | POA: Diagnosis not present

## 2016-03-15 ENCOUNTER — Ambulatory Visit (INDEPENDENT_AMBULATORY_CARE_PROVIDER_SITE_OTHER): Payer: PPO | Admitting: Family Medicine

## 2016-03-15 ENCOUNTER — Encounter: Payer: Self-pay | Admitting: Family Medicine

## 2016-03-15 ENCOUNTER — Other Ambulatory Visit: Payer: Self-pay | Admitting: *Deleted

## 2016-03-15 VITALS — BP 154/86 | HR 80 | Temp 97.0°F | Ht 59.0 in | Wt 149.0 lb

## 2016-03-15 DIAGNOSIS — K2100 Gastro-esophageal reflux disease with esophagitis, without bleeding: Secondary | ICD-10-CM

## 2016-03-15 DIAGNOSIS — G8929 Other chronic pain: Secondary | ICD-10-CM

## 2016-03-15 DIAGNOSIS — J019 Acute sinusitis, unspecified: Secondary | ICD-10-CM | POA: Diagnosis not present

## 2016-03-15 DIAGNOSIS — R7303 Prediabetes: Secondary | ICD-10-CM

## 2016-03-15 DIAGNOSIS — I10 Essential (primary) hypertension: Secondary | ICD-10-CM

## 2016-03-15 DIAGNOSIS — E038 Other specified hypothyroidism: Secondary | ICD-10-CM | POA: Diagnosis not present

## 2016-03-15 DIAGNOSIS — M545 Low back pain: Secondary | ICD-10-CM | POA: Diagnosis not present

## 2016-03-15 LAB — BAYER DCA HB A1C WAIVED: HB A1C (BAYER DCA - WAIVED): 6 % (ref <7.0)

## 2016-03-15 MED ORDER — ESOMEPRAZOLE MAGNESIUM 40 MG PO CPDR: 40.0000 mg | DELAYED_RELEASE_CAPSULE | Freq: Every day | ORAL | Status: DC

## 2016-03-15 MED ORDER — CYCLOBENZAPRINE HCL 10 MG PO TABS: 10.0000 mg | ORAL_TABLET | Freq: Three times a day (TID) | ORAL | Status: DC | PRN

## 2016-03-15 MED ORDER — FLUTICASONE PROPIONATE 50 MCG/ACT NA SUSP: 1.0000 | Freq: Two times a day (BID) | NASAL | Status: DC | PRN

## 2016-03-15 MED ORDER — SPIRONOLACTONE-HCTZ 25-25 MG PO TABS: 1.0000 | ORAL_TABLET | Freq: Every day | ORAL | Status: DC

## 2016-03-15 MED ORDER — LEVOTHYROXINE SODIUM 75 MCG PO TABS: 75.0000 ug | ORAL_TABLET | Freq: Every day | ORAL | Status: DC

## 2016-03-15 NOTE — Progress Notes (Signed)
Subjective:  Patient ID: Samantha Woods, female    DOB: 1921-10-21  Age: 82 y.o. MRN: [id number]  CC: Follow-up   HPI Samantha Woods presents for  follow-up of hypertension. Patient has no history of headache chest pain or shortness of breath or recent cough. Patient also denies symptoms of TIA such as numbness weakness lateralizing. Patient checks  blood pressure at home and has not had any elevated readings recently. Patient denies side effects from his medication. States taking it regularly.  Patient presents for follow-up on  thyroid. The patient has a history of hypothyroidism for many years. It has been stable recently. Pt. denies any change in  voice, loss of hair, heat or cold intolerance. Energy level has been adequate to good. Patient denies constipation and diarrhea. No myxedema. Medication is as noted below. Verified that pt is taking it daily on an empty stomach. Well tolerated.  Patient in for follow-up of GERD. Currently asymptomatic taking  PPI daily. There is no chest pain or heartburn. No hematemesis and no melena. No dysphagia or choking. Onset is remote. Progression is stable. Complicating factors, none.   Follow-up of prediabetesPatient denies symptoms such as polyuria, polydipsia, excessive hunger, nausea. Lumbago under control except occasional spasm which is relieved by cyclobenzaprine  History Samantha Woods has a past medical history of Thyroid disease and GERD (gastroesophageal reflux disease).   She has past surgical history that includes Abdominal hysterectomy.   Her family history is not on file.She reports that she has never smoked. She has never used smokeless tobacco. She reports that she drinks alcohol. She reports that she does not use illicit drugs.  Current Outpatient Prescriptions on File Prior to Visit  Medication Sig Dispense Refill  . acetaminophen (TYLENOL) 325 MG tablet Take 650 mg by mouth every 4 (four) hours as needed.    Marland Kitchen aspirin EC 81 MG tablet  Take 81 mg by mouth daily.    . Cholecalciferol (VITAMIN D) 2000 UNITS CAPS Take 1 capsule by mouth.    . docusate sodium (COLACE) 100 MG capsule Take 100 mg by mouth 2 (two) times daily.    . Omega-3 Fatty Acids (FISH OIL) 1000 MG CAPS Take 1 capsule by mouth daily.     No current facility-administered medications on file prior to visit.    ROS Review of Systems  Constitutional: Negative for fever, activity change and appetite change.  HENT: Negative for congestion, rhinorrhea and sore throat.   Eyes: Negative for visual disturbance.  Respiratory: Negative for cough and shortness of breath.   Cardiovascular: Negative for chest pain and palpitations.  Gastrointestinal: Negative for nausea, abdominal pain and diarrhea.  Genitourinary: Negative for dysuria.  Musculoskeletal: Positive for myalgias, back pain and arthralgias.    Objective:  BP 154/86 mmHg  Pulse 80  Temp(Src) 97 F (36.1 C) (Oral)  Ht 4' 11"  (1.499 m)  Wt 149 lb (67.586 kg)  BMI 30.08 kg/m2  SpO2 97%  BP Readings from Last 3 Encounters:  03/15/16 154/86  01/12/16 122/74  09/14/15 128/74    Wt Readings from Last 3 Encounters:  03/15/16 149 lb (67.586 kg)  01/12/16 148 lb (67.132 kg)  09/14/15 151 lb 12.8 oz (68.856 kg)     Physical Exam  Constitutional: She is oriented to person, place, and time. She appears well-developed and well-nourished. No distress.  HENT:  Head: Normocephalic and atraumatic.  Right Ear: External ear normal.  Left Ear: External ear normal.  Nose: Nose normal.  Mouth/Throat: Oropharynx is clear  and moist.  Eyes: Conjunctivae and EOM are normal. Pupils are equal, round, and reactive to light.  Neck: Normal range of motion. Neck supple. No thyromegaly present.  Cardiovascular: Normal rate, regular rhythm and normal heart sounds.   No murmur heard. Pulmonary/Chest: Effort normal and breath sounds normal. No respiratory distress. She has no wheezes. She has no rales.  Abdominal:  Soft. Bowel sounds are normal. She exhibits no distension. There is no tenderness.  Musculoskeletal: Normal range of motion.  Marked dorsal kyphosis  Lymphadenopathy:    She has no cervical adenopathy.  Neurological: She is alert and oriented to person, place, and time. She has normal reflexes.  Skin: Skin is warm and dry.  Psychiatric: She has a normal mood and affect. Her behavior is normal. Judgment and thought content normal.    Lab Results  Component Value Date   HGBA1C 5.6 09/14/2015   HGBA1C 5.7% 01/19/2015    Lab Results  Component Value Date   WBC 7.5 09/14/2015   HGB 14.1 01/19/2015   HCT 43.3 09/14/2015   PLT 286 09/14/2015   GLUCOSE 96 09/14/2015   CHOL 179 01/19/2015   TRIG 137 01/19/2015   HDL 46 01/19/2015   LDLCALC 106* 01/19/2015   ALT 12 09/14/2015   AST 19 09/14/2015   NA 141 09/14/2015   K 3.7 09/14/2015   CL 95* 09/14/2015   CREATININE 0.95 09/14/2015   BUN 14 09/14/2015   CO2 27 09/14/2015   TSH 1.250 09/14/2015   HGBA1C 5.6 09/14/2015    Dg Chest 2 View  06/10/2001  FINDINGS CLINICAL DATA:  CHEST PAIN/COUGH. TWO VIEW CHEST NO PRIOR EXAMS FOR COMPARISON.  HEART SLIGHTLY ENLARGED WITH A SOMEWHAT PROMINENT LEFT VENTRICULAR CONTOUR AND TORTUOUS AORTA.  NO CONGESTIVE HEART FAILURE OR ACTIVE DISEASE.  LUNGS CLEAR.  THERE MAY BE A MILD DEGREE OF HYPERAERATION RAISING THE QUESTION OF EARLY COPD. AN ADDITIONAL FINDING IS A SMALL CALCIFIED GRANULOMA IN THE LEFT UPPER LOBE.  OSSEOUS STRUCTURES INTACT WITH BONY DEMINERALIZATION CONSISTENT WITH THE PATIENT'S AGE. IMPRESSION 1.  PROMINENT LEFT VENTRICLE AND AORTA - NO ACTIVE DISEASE. 2.  CHRONIC LUNG CHANGES AS DESCRIBED ABOVE.   Assessment & Plan:   Samantha Woods was seen today for follow-up.  Diagnoses and all orders for this visit:  Essential hypertension -     CBC with Differential/Platelet -     CMP14+EGFR -     Lipid panel -     Vitamin D 1,25 dihydroxy  Gastroesophageal reflux disease with esophagitis -      CBC with Differential/Platelet -     CMP14+EGFR -     Lipid panel -     Vitamin D 1,25 dihydroxy  Chronic low back pain without sciatica, unspecified back pain laterality  Prediabetes -     CBC with Differential/Platelet -     CMP14+EGFR -     Lipid panel -     Vitamin D 1,25 dihydroxy -     POCT glycosylated hemoglobin (Hb A1C)  Other specified hypothyroidism -     CMP14+EGFR -     TSH + free T4  Acute rhinosinusitis Comments: Try Flonase and Mucinex to see if she can clear up the pressure. If worsens or does not improve then return Orders: -     fluticasone (FLONASE) 50 MCG/ACT nasal spray; Place 1 spray into both nostrils 2 (two) times daily as needed for allergies or rhinitis.  Other orders -     cyclobenzaprine (FLEXERIL) 10 MG tablet; Take  1 tablet (10 mg total) by mouth 3 (three) times daily as needed for muscle spasms. -     spironolactone-hydrochlorothiazide (ALDACTAZIDE) 25-25 MG tablet; Take 1 tablet by mouth daily. -     esomeprazole (NEXIUM) 40 MG capsule; Take 1 capsule (40 mg total) by mouth daily at 12 noon. -     levothyroxine (SYNTHROID, LEVOTHROID) 75 MCG tablet; Take 1 tablet (75 mcg total) by mouth daily before breakfast.   I have changed Ms. Scarboro spironolactone-hydrochlorothiazide. I am also having her start on cyclobenzaprine. Additionally, I am having her maintain her docusate sodium, Fish Oil, aspirin EC, Vitamin D, acetaminophen, esomeprazole, fluticasone, and levothyroxine.  Meds ordered this encounter  Medications  . cyclobenzaprine (FLEXERIL) 10 MG tablet    Sig: Take 1 tablet (10 mg total) by mouth 3 (three) times daily as needed for muscle spasms.    Dispense:  90 tablet    Refill:  1  . spironolactone-hydrochlorothiazide (ALDACTAZIDE) 25-25 MG tablet    Sig: Take 1 tablet by mouth daily.    Dispense:  90 tablet    Refill:  3  . esomeprazole (NEXIUM) 40 MG capsule    Sig: Take 1 capsule (40 mg total) by mouth daily at 12 noon.     Dispense:  90 capsule    Refill:  3  . fluticasone (FLONASE) 50 MCG/ACT nasal spray    Sig: Place 1 spray into both nostrils 2 (two) times daily as needed for allergies or rhinitis.    Dispense:  16 g    Refill:  6  . levothyroxine (SYNTHROID, LEVOTHROID) 75 MCG tablet    Sig: Take 1 tablet (75 mcg total) by mouth daily before breakfast.    Dispense:  30 tablet    Refill:  10     Follow-up: No Follow-up on file.  Claretta Fraise, M.D.

## 2016-03-15 NOTE — Addendum Note (Signed)
Addended by: Earlene Plater on: 03/15/2016 02:45 PM   Modules accepted: Orders

## 2016-03-18 LAB — CBC WITH DIFFERENTIAL/PLATELET
BASOS ABS: 0.1 10*3/uL (ref 0.0–0.2)
Basos: 1 %
EOS (ABSOLUTE): 0.3 10*3/uL (ref 0.0–0.4)
EOS: 3 %
HEMATOCRIT: 43.1 % (ref 34.0–46.6)
HEMOGLOBIN: 14.1 g/dL (ref 11.1–15.9)
IMMATURE GRANULOCYTES: 0 %
Immature Grans (Abs): 0 10*3/uL (ref 0.0–0.1)
LYMPHS ABS: 2.9 10*3/uL (ref 0.7–3.1)
Lymphs: 35 %
MCH: 29.9 pg (ref 26.6–33.0)
MCHC: 32.7 g/dL (ref 31.5–35.7)
MCV: 91 fL (ref 79–97)
MONOCYTES: 9 %
MONOS ABS: 0.8 10*3/uL (ref 0.1–0.9)
NEUTROS PCT: 52 %
Neutrophils Absolute: 4.2 10*3/uL (ref 1.4–7.0)
Platelets: 311 10*3/uL (ref 150–379)
RBC: 4.72 x10E6/uL (ref 3.77–5.28)
RDW: 14.5 % (ref 12.3–15.4)
WBC: 8.2 10*3/uL (ref 3.4–10.8)

## 2016-03-18 LAB — VITAMIN D 1,25 DIHYDROXY
Vitamin D 1, 25 (OH)2 Total: 43 pg/mL
Vitamin D2 1, 25 (OH)2: <10 pg/mL
Vitamin D3 1, 25 (OH)2: 43 pg/mL

## 2016-03-18 LAB — LIPID PANEL
CHOLESTEROL TOTAL: 159 mg/dL (ref 100–199)
Chol/HDL Ratio: 3.6 ratio units (ref 0.0–4.4)
HDL: 44 mg/dL (ref >39)
LDL Calculated: 90 mg/dL (ref 0–99)
TRIGLYCERIDES: 127 mg/dL (ref 0–149)
VLDL CHOLESTEROL CAL: 25 mg/dL (ref 5–40)

## 2016-03-18 LAB — CMP14+EGFR
ALK PHOS: 88 IU/L (ref 39–117)
ALT: 15 IU/L (ref 0–32)
AST: 23 IU/L (ref 0–40)
Albumin/Globulin Ratio: 1.6 (ref 1.2–2.2)
Albumin: 4.4 g/dL (ref 3.2–4.6)
BUN/Creatinine Ratio: 17 (ref 12–28)
BUN: 14 mg/dL (ref 10–36)
Bilirubin Total: 0.8 mg/dL (ref 0.0–1.2)
CALCIUM: 9.6 mg/dL (ref 8.7–10.3)
CO2: 28 mmol/L (ref 18–29)
CREATININE: 0.83 mg/dL (ref 0.57–1.00)
Chloride: 97 mmol/L (ref 96–106)
GFR calc Af Amer: 70 mL/min/{1.73_m2} (ref >59)
GFR, EST NON AFRICAN AMERICAN: 61 mL/min/{1.73_m2} (ref >59)
GLUCOSE: 101 mg/dL — AB (ref 65–99)
Globulin, Total: 2.7 g/dL (ref 1.5–4.5)
Potassium: 4.2 mmol/L (ref 3.5–5.2)
Sodium: 142 mmol/L (ref 134–144)
Total Protein: 7.1 g/dL (ref 6.0–8.5)

## 2016-03-18 LAB — TSH+FREE T4
FREE T4: 1.61 ng/dL (ref 0.82–1.77)
TSH: 2.04 u[IU]/mL (ref 0.450–4.500)

## 2016-03-21 ENCOUNTER — Telehealth: Payer: Self-pay

## 2016-03-21 NOTE — Telephone Encounter (Signed)
Insurance prior authorized Esomeprazole through 11/25/16

## 2016-05-09 ENCOUNTER — Encounter: Payer: Self-pay | Admitting: Physician Assistant

## 2016-05-09 ENCOUNTER — Ambulatory Visit (INDEPENDENT_AMBULATORY_CARE_PROVIDER_SITE_OTHER): Payer: PPO | Admitting: Physician Assistant

## 2016-05-09 VITALS — BP 127/73 | HR 90 | Temp 97.2°F | Ht 59.0 in | Wt 145.0 lb

## 2016-05-09 DIAGNOSIS — J0111 Acute recurrent frontal sinusitis: Secondary | ICD-10-CM

## 2016-05-09 MED ORDER — AMOXICILLIN 875 MG PO TABS: 875.0000 mg | ORAL_TABLET | Freq: Two times a day (BID) | ORAL | Status: DC

## 2016-05-09 NOTE — Progress Notes (Signed)
Subjective:     Patient ID: Kamoria Vittitow, female   DOB: 12-12-1920, 82 y.o.   MRN: [id number]  HPI Pt with sinus pain and pressure x 2-3 days Now with worsening S/T Using saline spray and Flonase  Review of Systems  Constitutional: Negative.   HENT: Positive for congestion, postnasal drip, rhinorrhea, sinus pressure, sneezing and sore throat. Negative for ear discharge, ear pain, nosebleeds, trouble swallowing and voice change.   Respiratory: Negative.   Cardiovascular: Negative.        Objective:   Physical Exam  Constitutional: She appears well-developed and well-nourished.  HENT:  Right Ear: External ear normal.  Left Ear: External ear normal.  Mouth/Throat: Oropharynx is clear and moist. No oropharyngeal exudate.  + thick PND + Frontal sinus TTP  Neck: Neck supple.  Cardiovascular: Normal rate, regular rhythm and normal heart sounds.   Pulmonary/Chest: Effort normal and breath sounds normal.  Lymphadenopathy:    She has no cervical adenopathy.  Nursing note and vitals reviewed.      Assessment:     1. Acute recurrent frontal sinusitis        Plan:     Fluids Rest Amox 875mg  bid x 10 days Activities as tol F/U prn

## 2016-05-09 NOTE — Patient Instructions (Signed)

## 2016-09-18 ENCOUNTER — Encounter (INDEPENDENT_AMBULATORY_CARE_PROVIDER_SITE_OTHER): Payer: Self-pay

## 2016-09-18 ENCOUNTER — Encounter: Payer: Self-pay | Admitting: Family Medicine

## 2016-09-18 ENCOUNTER — Ambulatory Visit (INDEPENDENT_AMBULATORY_CARE_PROVIDER_SITE_OTHER): Payer: PPO | Admitting: Family Medicine

## 2016-09-18 VITALS — BP 137/80 | HR 78 | Temp 97.0°F | Ht 59.0 in | Wt 144.4 lb

## 2016-09-18 DIAGNOSIS — H9193 Unspecified hearing loss, bilateral: Secondary | ICD-10-CM

## 2016-09-18 DIAGNOSIS — M545 Low back pain, unspecified: Secondary | ICD-10-CM

## 2016-09-18 DIAGNOSIS — Z23 Encounter for immunization: Secondary | ICD-10-CM | POA: Diagnosis not present

## 2016-09-18 DIAGNOSIS — R7303 Prediabetes: Secondary | ICD-10-CM | POA: Diagnosis not present

## 2016-09-18 DIAGNOSIS — K2100 Gastro-esophageal reflux disease with esophagitis, without bleeding: Secondary | ICD-10-CM

## 2016-09-18 DIAGNOSIS — B351 Tinea unguium: Secondary | ICD-10-CM | POA: Diagnosis not present

## 2016-09-18 DIAGNOSIS — E038 Other specified hypothyroidism: Secondary | ICD-10-CM | POA: Diagnosis not present

## 2016-09-18 DIAGNOSIS — G8929 Other chronic pain: Secondary | ICD-10-CM | POA: Diagnosis not present

## 2016-09-18 DIAGNOSIS — L600 Ingrowing nail: Secondary | ICD-10-CM | POA: Diagnosis not present

## 2016-09-18 DIAGNOSIS — J019 Acute sinusitis, unspecified: Secondary | ICD-10-CM

## 2016-09-18 DIAGNOSIS — I10 Essential (primary) hypertension: Secondary | ICD-10-CM | POA: Diagnosis not present

## 2016-09-18 LAB — BAYER DCA HB A1C WAIVED: HB A1C: 5.6 % (ref <7.0)

## 2016-09-18 MED ORDER — FLUTICASONE PROPIONATE 50 MCG/ACT NA SUSP: 1.0000 | Freq: Two times a day (BID) | NASAL | 3 refills | Status: DC | PRN

## 2016-09-18 MED ORDER — SPIRONOLACTONE-HCTZ 25-25 MG PO TABS: 1.0000 | ORAL_TABLET | Freq: Every day | ORAL | 3 refills | Status: DC

## 2016-09-18 MED ORDER — LEVOTHYROXINE SODIUM 75 MCG PO TABS: 75.0000 ug | ORAL_TABLET | Freq: Every day | ORAL | 3 refills | Status: DC

## 2016-09-18 NOTE — Progress Notes (Signed)
Subjective:  Patient ID: Samantha Woods, female    DOB: 25-Mar-1921  Age: 82 y.o. MRN: [id number]  CC: Hypertension (pt here for a routine follow up on her HTN and thyroid. Pt also wants a flu shot today.)   HPI Samantha Woods presents for  follow-up of hypertension. Patient has no history of headache chest pain or shortness of breath or recent cough. Patient also denies symptoms of TIA such as numbness weakness lateralizing. Patient checks  blood pressure at home and has not had any elevated readings recently. Patient denies side effects from medication. States taking it regularly.   History Samantha Woods has a past medical history of GERD (gastroesophageal reflux disease) and Thyroid disease.   She has a past surgical history that includes Abdominal hysterectomy.   Her family history is not on file.She reports that she has never smoked. She has never used smokeless tobacco. She reports that she drinks alcohol. She reports that she does not use drugs.  Current Outpatient Prescriptions on File Prior to Visit  Medication Sig Dispense Refill  . acetaminophen (TYLENOL) 325 MG tablet Take 650 mg by mouth every 4 (four) hours as needed.    Marland Kitchen aspirin EC 81 MG tablet Take 81 mg by mouth daily.    . Cholecalciferol (VITAMIN D) 2000 UNITS CAPS Take 1 capsule by mouth.    . cyclobenzaprine (FLEXERIL) 10 MG tablet Take 1 tablet (10 mg total) by mouth 3 (three) times daily as needed for muscle spasms. 90 tablet 1  . docusate sodium (COLACE) 100 MG capsule Take 100 mg by mouth 2 (two) times daily.    Marland Kitchen esomeprazole (NEXIUM) 40 MG capsule Take 1 capsule (40 mg total) by mouth daily at 12 noon. 90 capsule 3  . Omega-3 Fatty Acids (FISH OIL) 1000 MG CAPS Take 1 capsule by mouth daily.     No current facility-administered medications on file prior to visit.     ROS Review of Systems  Constitutional: Negative for activity change, appetite change and fever.  HENT: Positive for congestion, ear pain and  hearing loss. Negative for rhinorrhea and sore throat.   Eyes: Negative for visual disturbance.  Respiratory: Negative for cough and shortness of breath.   Cardiovascular: Negative for chest pain and palpitations.  Gastrointestinal: Negative for abdominal pain, diarrhea and nausea.  Genitourinary: Negative for dysuria.  Musculoskeletal: Negative for arthralgias and myalgias.    Objective:  BP 137/80   Pulse 78   Temp 97 F (36.1 C) (Oral)   Ht 4' 11"  (1.499 m)   Wt 144 lb 6 oz (65.5 kg)   BMI 29.16 kg/m   BP Readings from Last 3 Encounters:  09/18/16 137/80  05/09/16 127/73  03/15/16 (!) 154/86    Wt Readings from Last 3 Encounters:  09/18/16 144 lb 6 oz (65.5 kg)  05/09/16 145 lb (65.8 kg)  03/15/16 149 lb (67.6 kg)     Physical Exam  Constitutional: She is oriented to person, place, and time. She appears well-developed and well-nourished. No distress.  HENT:  Head: Normocephalic and atraumatic.  Right Ear: External ear normal.  Left Ear: External ear normal.  Nose: Nose normal.  Mouth/Throat: Oropharynx is clear and moist.  Eyes: Conjunctivae and EOM are normal. Pupils are equal, round, and reactive to light.  Neck: Normal range of motion. Neck supple. No thyromegaly present.  Cardiovascular: Normal rate, regular rhythm and normal heart sounds.   No murmur heard. Pulmonary/Chest: Effort normal and breath sounds normal. No respiratory distress.  She has no wheezes. She has no rales.  Abdominal: Soft. Bowel sounds are normal. She exhibits no distension. There is no tenderness.  Lymphadenopathy:    She has no cervical adenopathy.  Neurological: She is alert and oriented to person, place, and time. She has normal reflexes.  Skin: Skin is warm and dry.  Psychiatric: She has a normal mood and affect. Her behavior is normal. Judgment and thought content normal.     Lab Results  Component Value Date   WBC 8.2 03/15/2016   HGB 14.1 01/19/2015   HCT 43.1 03/15/2016    PLT 311 03/15/2016   GLUCOSE 101 (H) 03/15/2016   CHOL 159 03/15/2016   TRIG 127 03/15/2016   HDL 44 03/15/2016   LDLCALC 90 03/15/2016   ALT 15 03/15/2016   AST 23 03/15/2016   NA 142 03/15/2016   K 4.2 03/15/2016   CL 97 03/15/2016   CREATININE 0.83 03/15/2016   BUN 14 03/15/2016   CO2 28 03/15/2016   TSH 2.040 03/15/2016   HGBA1C 5.6 09/14/2015    Dg Chest 2 View  Result Date: 06/10/2001 FINDINGS CLINICAL DATA:  CHEST PAIN/COUGH. TWO VIEW CHEST NO PRIOR EXAMS FOR COMPARISON.  HEART SLIGHTLY ENLARGED WITH A SOMEWHAT PROMINENT LEFT VENTRICULAR CONTOUR AND TORTUOUS AORTA.  NO CONGESTIVE HEART FAILURE OR ACTIVE DISEASE.  LUNGS CLEAR.  THERE MAY BE A MILD DEGREE OF HYPERAERATION RAISING THE QUESTION OF EARLY COPD. AN ADDITIONAL FINDING IS A SMALL CALCIFIED GRANULOMA IN THE LEFT UPPER LOBE.  OSSEOUS STRUCTURES INTACT WITH BONY DEMINERALIZATION CONSISTENT WITH THE PATIENT'S AGE. IMPRESSION 1.  PROMINENT LEFT VENTRICLE AND AORTA - NO ACTIVE DISEASE. 2.  CHRONIC LUNG CHANGES AS DESCRIBED ABOVE.   Assessment & Plan:   Samantha Woods was seen today for hypertension.  Diagnoses and all orders for this visit:  Other specified hypothyroidism -     CMP14+EGFR -     TSH + free T4  Prediabetes -     CMP14+EGFR -     Bayer DCA Hb A1c Waived  Essential hypertension -     CMP14+EGFR  Chronic low back pain without sciatica, unspecified back pain laterality -     CMP14+EGFR  Gastroesophageal reflux disease with esophagitis -     CBC with Differential/Platelet -     CMP14+EGFR  Acute rhinosinusitis Comments: Try Flonase and Mucinex to see if she can clear up the pressure. If worsens or does not improve then return Orders: -     fluticasone (FLONASE) 50 MCG/ACT nasal spray; Place 1 spray into both nostrils 2 (two) times daily as needed for allergies or rhinitis.  Onychomycosis with ingrown toenail -     Ambulatory referral to Podiatry  Bilateral hearing loss, unspecified hearing loss  type -     Ambulatory referral to Audiology  Other orders -     levothyroxine (SYNTHROID, LEVOTHROID) 75 MCG tablet; Take 1 tablet (75 mcg total) by mouth daily before breakfast. -     spironolactone-hydrochlorothiazide (ALDACTAZIDE) 25-25 MG tablet; Take 1 tablet by mouth daily.   I have discontinued Ms. Belardo amoxicillin. I am also having her maintain her docusate sodium, Fish Oil, aspirin EC, Vitamin D, acetaminophen, cyclobenzaprine, esomeprazole, fluticasone, levothyroxine, and spironolactone-hydrochlorothiazide.  Meds ordered this encounter  Medications  . fluticasone (FLONASE) 50 MCG/ACT nasal spray    Sig: Place 1 spray into both nostrils 2 (two) times daily as needed for allergies or rhinitis.    Dispense:  48 g    Refill:  3  .  levothyroxine (SYNTHROID, LEVOTHROID) 75 MCG tablet    Sig: Take 1 tablet (75 mcg total) by mouth daily before breakfast.    Dispense:  90 tablet    Refill:  3  . spironolactone-hydrochlorothiazide (ALDACTAZIDE) 25-25 MG tablet    Sig: Take 1 tablet by mouth daily.    Dispense:  90 tablet    Refill:  3      Follow-up: Return in about 6 months (around 03/19/2017).  Claretta Fraise, M.D.

## 2016-09-19 LAB — CBC WITH DIFFERENTIAL/PLATELET
BASOS ABS: 0.1 10*3/uL (ref 0.0–0.2)
BASOS: 1 %
EOS (ABSOLUTE): 0.4 10*3/uL (ref 0.0–0.4)
Eos: 4 %
Hematocrit: 44.4 % (ref 34.0–46.6)
Hemoglobin: 14.8 g/dL (ref 11.1–15.9)
IMMATURE GRANS (ABS): 0 10*3/uL (ref 0.0–0.1)
IMMATURE GRANULOCYTES: 0 %
LYMPHS: 29 %
Lymphocytes Absolute: 2.8 10*3/uL (ref 0.7–3.1)
MCH: 30.1 pg (ref 26.6–33.0)
MCHC: 33.3 g/dL (ref 31.5–35.7)
MCV: 90 fL (ref 79–97)
MONOS ABS: 1.1 10*3/uL — AB (ref 0.1–0.9)
Monocytes: 11 %
NEUTROS ABS: 5.5 10*3/uL (ref 1.4–7.0)
NEUTROS PCT: 55 %
PLATELETS: 339 10*3/uL (ref 150–379)
RBC: 4.92 x10E6/uL (ref 3.77–5.28)
RDW: 14.3 % (ref 12.3–15.4)
WBC: 9.7 10*3/uL (ref 3.4–10.8)

## 2016-09-19 LAB — CMP14+EGFR
A/G RATIO: 1.7 (ref 1.2–2.2)
ALT: 19 IU/L (ref 0–32)
AST: 30 IU/L (ref 0–40)
Albumin: 4.3 g/dL (ref 3.2–4.6)
Alkaline Phosphatase: 97 IU/L (ref 39–117)
BILIRUBIN TOTAL: 0.7 mg/dL (ref 0.0–1.2)
BUN/Creatinine Ratio: 12 (ref 12–28)
BUN: 12 mg/dL (ref 10–36)
CHLORIDE: 98 mmol/L (ref 96–106)
CO2: 29 mmol/L (ref 18–29)
Calcium: 9.8 mg/dL (ref 8.7–10.3)
Creatinine, Ser: 0.99 mg/dL (ref 0.57–1.00)
GFR calc Af Amer: 57 mL/min/{1.73_m2} — ABNORMAL LOW (ref >59)
GFR calc non Af Amer: 49 mL/min/{1.73_m2} — ABNORMAL LOW (ref >59)
GLUCOSE: 107 mg/dL — AB (ref 65–99)
Globulin, Total: 2.5 g/dL (ref 1.5–4.5)
POTASSIUM: 4.3 mmol/L (ref 3.5–5.2)
Sodium: 142 mmol/L (ref 134–144)
Total Protein: 6.8 g/dL (ref 6.0–8.5)

## 2016-09-19 LAB — TSH+FREE T4
FREE T4: 1.57 ng/dL (ref 0.82–1.77)
TSH: 1.32 u[IU]/mL (ref 0.450–4.500)

## 2017-03-26 ENCOUNTER — Encounter: Payer: Self-pay | Admitting: Family Medicine

## 2017-03-26 ENCOUNTER — Ambulatory Visit (INDEPENDENT_AMBULATORY_CARE_PROVIDER_SITE_OTHER): Payer: PPO | Admitting: Family Medicine

## 2017-03-26 VITALS — BP 132/69 | HR 63 | Temp 97.0°F | Ht 59.0 in | Wt 150.0 lb

## 2017-03-26 DIAGNOSIS — E038 Other specified hypothyroidism: Secondary | ICD-10-CM | POA: Diagnosis not present

## 2017-03-26 DIAGNOSIS — G8929 Other chronic pain: Secondary | ICD-10-CM | POA: Diagnosis not present

## 2017-03-26 DIAGNOSIS — M545 Low back pain: Secondary | ICD-10-CM | POA: Diagnosis not present

## 2017-03-26 DIAGNOSIS — K2100 Gastro-esophageal reflux disease with esophagitis, without bleeding: Secondary | ICD-10-CM

## 2017-03-26 DIAGNOSIS — R7303 Prediabetes: Secondary | ICD-10-CM

## 2017-03-26 DIAGNOSIS — L84 Corns and callosities: Secondary | ICD-10-CM

## 2017-03-26 DIAGNOSIS — I10 Essential (primary) hypertension: Secondary | ICD-10-CM | POA: Diagnosis not present

## 2017-03-26 LAB — BAYER DCA HB A1C WAIVED: HB A1C: 5.5 % (ref <7.0)

## 2017-03-26 NOTE — Progress Notes (Signed)
Subjective:  Patient ID: Samantha Woods, female    DOB: Oct 17, 1921  Age: 83 y.o. MRN: [id number]  CC: Hypothyroidism (pt here today for routine follow up of her chronic medical conditions. She would also like a refill on her flexerill.)   HPI Samantha Woods presents for Patient presents for follow-up on  thyroid. The patient has a history of hypothyroidism for many years. It has been stable recently. Pt. denies any change in  voice, loss of hair, heat or cold intolerance. Energy level has been adequate to good. Patient denies constipation and diarrhea. No myxedema. Medication is as noted below. Verified that pt is taking it daily on an empty stomach. Well tolerated.  Patient noted to have prediabetes in the past. No symptoms including polydipsia and polyuria have been noted. Patient doesn't check her blood sugar.  Patient has some painful calluses on her feet that are building up. She would like to have a podiatry checkup. She needs to use someone local such as Dr. Irving Shows. She can go to [facility] for this.   follow-up of hypertension. Patient has no history of headache chest pain or shortness of breath or recent cough. Patient also denies symptoms of TIA such as numbness weakness lateralizing. Patient denies side effects from his medication. States taking it regularly. Patient in for follow-up of GERD. Currently asymptomatic taking  PPI daily. There is no chest pain or heartburn. No hematemesis and no melena. No dysphagia or choking. Onset is remote. Progression is stable. Complicating factors, none.    History Samantha Woods has a past medical history of GERD (gastroesophageal reflux disease) and Thyroid disease.   She has a past surgical history that includes Abdominal hysterectomy.   Her family history is not on file.She reports that she has never smoked. She has never used smokeless tobacco. She reports that she drinks alcohol. She reports that she does not use drugs.    ROS Review of  Systems  Constitutional: Negative for activity change, appetite change and fever.  HENT: Negative for congestion, rhinorrhea and sore throat.   Eyes: Negative for visual disturbance.  Respiratory: Negative for cough and shortness of breath.   Cardiovascular: Negative for chest pain and palpitations.  Gastrointestinal: Negative for abdominal pain, diarrhea and nausea.  Genitourinary: Negative for dysuria.  Musculoskeletal: Positive for back pain. Negative for myalgias.    Objective:  BP 132/69   Pulse 63   Temp 97 F (36.1 C) (Oral)   Ht 4' 11" (1.499 m)   Wt 150 lb (68 kg)   BMI 30.30 kg/m   BP Readings from Last 3 Encounters:  03/26/17 132/69  09/18/16 137/80  05/09/16 127/73    Wt Readings from Last 3 Encounters:  03/26/17 150 lb (68 kg)  09/18/16 144 lb 6 oz (65.5 kg)  05/09/16 145 lb (65.8 kg)     Physical Exam  Constitutional: She is oriented to person, place, and time. She appears well-developed and well-nourished. No distress.  HENT:  Head: Normocephalic and atraumatic.  Right Ear: External ear normal.  Left Ear: External ear normal.  Nose: Nose normal.  Eyes: Conjunctivae and EOM are normal. Pupils are equal, round, and reactive to light.  Neck: Normal range of motion. Neck supple. Carotid bruit is not present. No thyromegaly present.  Cardiovascular: Normal rate, regular rhythm and normal heart sounds.   No murmur heard. Pulmonary/Chest: Effort normal and breath sounds normal. No respiratory distress. She has no wheezes. She has no rales.  Abdominal: Soft. Bowel sounds are normal.  She exhibits no distension. There is no tenderness.  Musculoskeletal:  Stooped posture slow antalgic gait  Lymphadenopathy:    She has no cervical adenopathy.  Neurological: She is alert and oriented to person, place, and time. She has normal reflexes.  Skin: Skin is warm and dry.  Psychiatric: She has a normal mood and affect. Her behavior is normal. Judgment and thought  content normal.      Assessment & Plan:   Samantha Woods was seen today for hypothyroidism.  Diagnoses and all orders for this visit:  Other specified hypothyroidism -     CBC with Differential/Platelet -     CMP14+EGFR -     TSH + free T4  Gastroesophageal reflux disease with esophagitis -     CBC with Differential/Platelet  Prediabetes -     Bayer DCA Hb A1c Waived  Essential hypertension -     CMP14+EGFR  Chronic low back pain without sciatica, unspecified back pain laterality  Foot callus -     Ambulatory referral to Podiatry       I am having Samantha Woods maintain her docusate sodium, Fish Oil, aspirin EC, Vitamin D, acetaminophen, cyclobenzaprine, esomeprazole, fluticasone, levothyroxine, and spironolactone-hydrochlorothiazide.  Allergies as of 03/26/2017      Reactions   Sulfa Antibiotics Shortness Of Breath   Darvon [propoxyphene] Hives      Medication List       Accurate as of 03/26/17  1:40 PM. Always use your most recent med list.          acetaminophen 325 MG tablet Commonly known as:  TYLENOL Take 650 mg by mouth every 4 (four) hours as needed.   aspirin EC 81 MG tablet Take 81 mg by mouth daily.   cyclobenzaprine 10 MG tablet Commonly known as:  FLEXERIL Take 1 tablet (10 mg total) by mouth 3 (three) times daily as needed for muscle spasms.   docusate sodium 100 MG capsule Commonly known as:  COLACE Take 100 mg by mouth 2 (two) times daily.   esomeprazole 40 MG capsule Commonly known as:  NEXIUM Take 1 capsule (40 mg total) by mouth daily at 12 noon.   Fish Oil 1000 MG Caps Take 1 capsule by mouth daily.   fluticasone 50 MCG/ACT nasal spray Commonly known as:  FLONASE Place 1 spray into both nostrils 2 (two) times daily as needed for allergies or rhinitis.   levothyroxine 75 MCG tablet Commonly known as:  SYNTHROID, LEVOTHROID Take 1 tablet (75 mcg total) by mouth daily before breakfast.   spironolactone-hydrochlorothiazide 25-25 MG  tablet Commonly known as:  ALDACTAZIDE Take 1 tablet by mouth daily.   Vitamin D 2000 units Caps Take 1 capsule by mouth.        Follow-up: Return in about 6 months (around 09/26/2017).  Claretta Fraise, M.D.

## 2017-03-27 LAB — CMP14+EGFR
ALT: 15 IU/L (ref 0–32)
AST: 22 IU/L (ref 0–40)
Albumin/Globulin Ratio: 1.5 (ref 1.2–2.2)
Albumin: 4.3 g/dL (ref 3.2–4.6)
Alkaline Phosphatase: 89 IU/L (ref 39–117)
BUN/Creatinine Ratio: 18 (ref 12–28)
BUN: 15 mg/dL (ref 10–36)
Bilirubin Total: 0.8 mg/dL (ref 0.0–1.2)
CALCIUM: 9.7 mg/dL (ref 8.7–10.3)
CO2: 28 mmol/L (ref 18–29)
Chloride: 98 mmol/L (ref 96–106)
Creatinine, Ser: 0.85 mg/dL (ref 0.57–1.00)
GFR, EST AFRICAN AMERICAN: 68 mL/min/{1.73_m2} (ref >59)
GFR, EST NON AFRICAN AMERICAN: 59 mL/min/{1.73_m2} — AB (ref >59)
GLUCOSE: 91 mg/dL (ref 65–99)
Globulin, Total: 2.8 g/dL (ref 1.5–4.5)
Potassium: 4.5 mmol/L (ref 3.5–5.2)
Sodium: 141 mmol/L (ref 134–144)
TOTAL PROTEIN: 7.1 g/dL (ref 6.0–8.5)

## 2017-03-27 LAB — CBC WITH DIFFERENTIAL/PLATELET
BASOS ABS: 0.1 10*3/uL (ref 0.0–0.2)
BASOS: 1 %
EOS (ABSOLUTE): 0.3 10*3/uL (ref 0.0–0.4)
Eos: 4 %
Hematocrit: 41.6 % (ref 34.0–46.6)
Hemoglobin: 14 g/dL (ref 11.1–15.9)
IMMATURE GRANS (ABS): 0 10*3/uL (ref 0.0–0.1)
Immature Granulocytes: 0 %
LYMPHS: 31 %
Lymphocytes Absolute: 2.5 10*3/uL (ref 0.7–3.1)
MCH: 30.3 pg (ref 26.6–33.0)
MCHC: 33.7 g/dL (ref 31.5–35.7)
MCV: 90 fL (ref 79–97)
Monocytes Absolute: 1 10*3/uL — ABNORMAL HIGH (ref 0.1–0.9)
Monocytes: 12 %
Neutrophils Absolute: 4.2 10*3/uL (ref 1.4–7.0)
Neutrophils: 52 %
PLATELETS: 310 10*3/uL (ref 150–379)
RBC: 4.62 x10E6/uL (ref 3.77–5.28)
RDW: 14.7 % (ref 12.3–15.4)
WBC: 8 10*3/uL (ref 3.4–10.8)

## 2017-03-27 LAB — TSH+FREE T4
FREE T4: 1.54 ng/dL (ref 0.82–1.77)
TSH: 2.05 u[IU]/mL (ref 0.450–4.500)

## 2017-04-01 ENCOUNTER — Telehealth: Payer: Self-pay | Admitting: Family Medicine

## 2017-04-01 DIAGNOSIS — J019 Acute sinusitis, unspecified: Secondary | ICD-10-CM

## 2017-04-01 NOTE — Telephone Encounter (Signed)
Okay to refill all meds for 6 mos 

## 2017-04-02 MED ORDER — SPIRONOLACTONE-HCTZ 25-25 MG PO TABS: 1.0000 | ORAL_TABLET | Freq: Every day | ORAL | 1 refills | Status: DC

## 2017-04-02 MED ORDER — LEVOTHYROXINE SODIUM 75 MCG PO TABS: 75.0000 ug | ORAL_TABLET | Freq: Every day | ORAL | 0 refills | Status: DC

## 2017-04-02 MED ORDER — ESOMEPRAZOLE MAGNESIUM 40 MG PO CPDR: 40.0000 mg | DELAYED_RELEASE_CAPSULE | Freq: Every day | ORAL | 1 refills | Status: DC

## 2017-04-02 MED ORDER — FLUTICASONE PROPIONATE 50 MCG/ACT NA SUSP: 1.0000 | Freq: Two times a day (BID) | NASAL | 3 refills | Status: DC | PRN

## 2017-04-02 MED ORDER — CYCLOBENZAPRINE HCL 10 MG PO TABS: 10.0000 mg | ORAL_TABLET | Freq: Three times a day (TID) | ORAL | 1 refills | Status: DC | PRN

## 2017-04-02 NOTE — Telephone Encounter (Signed)
Family aware of refills for 6 mos - refills sent to [facility]

## 2017-04-16 DIAGNOSIS — B351 Tinea unguium: Secondary | ICD-10-CM | POA: Diagnosis not present

## 2017-04-16 DIAGNOSIS — E119 Type 2 diabetes mellitus without complications: Secondary | ICD-10-CM | POA: Diagnosis not present

## 2017-08-13 ENCOUNTER — Ambulatory Visit (INDEPENDENT_AMBULATORY_CARE_PROVIDER_SITE_OTHER): Payer: PPO | Admitting: Family Medicine

## 2017-08-13 ENCOUNTER — Ambulatory Visit (INDEPENDENT_AMBULATORY_CARE_PROVIDER_SITE_OTHER): Payer: PPO

## 2017-08-13 ENCOUNTER — Encounter: Payer: Self-pay | Admitting: Family Medicine

## 2017-08-13 VITALS — BP 128/83 | HR 93 | Temp 96.8°F | Ht 59.0 in | Wt 146.0 lb

## 2017-08-13 DIAGNOSIS — M150 Primary generalized (osteo)arthritis: Secondary | ICD-10-CM

## 2017-08-13 DIAGNOSIS — M159 Polyosteoarthritis, unspecified: Secondary | ICD-10-CM

## 2017-08-13 DIAGNOSIS — M810 Age-related osteoporosis without current pathological fracture: Secondary | ICD-10-CM

## 2017-08-13 MED ORDER — PREDNISONE 10 MG (21) PO TBPK: ORAL_TABLET | Freq: Every day | ORAL | 0 refills | Status: DC

## 2017-08-13 NOTE — Progress Notes (Signed)
Subjective:  Patient ID: Samantha Woods, female    DOB: 02-24-1921  Age: 83 y.o. MRN: [id number]  CC: Back Pain (pt here today c/o lower back pain on the left side that shoots down her leg)   HPI Aelyn Stanaland presents for 3 days of increasing low back pain. It radiates into the left buttock and down the posterior left thigh. It is moderately severe. Her son is with her and says it's similar to an episode 3 years ago that turned out to be muscle spasm that led to her being hospitalized and then having to go to a nursing home for a time. Patient says she's taken the Flexeril 3 times a day and not getting any relief. The son who gives a good bit of the history tells me that he gave her back half of oxycodone yesterday afternoon and she slept all afternoon but didn't complain of pain. She was complaining of pain again at bedtime so he gave her another half and says that she slept well all night.   Depression screen [facility] 2/9 08/13/2017 03/26/2017 09/18/2016  Decreased Interest 0 0 0  Down, Depressed, Hopeless 0 0 0  PHQ - 2 Score 0 0 0    History Elliyah has a past medical history of GERD (gastroesophageal reflux disease) and Thyroid disease.   She has a past surgical history that includes Abdominal hysterectomy.   Her family history is not on file.She reports that she has never smoked. She has never used smokeless tobacco. She reports that she drinks alcohol. She reports that she does not use drugs.    ROS Review of Systems  Constitutional: Negative for activity change, appetite change and fever.  HENT: Negative for congestion, rhinorrhea and sore throat.   Eyes: Negative for visual disturbance.  Respiratory: Negative for cough and shortness of breath.   Cardiovascular: Negative for chest pain and palpitations.  Gastrointestinal: Negative for abdominal pain, diarrhea and nausea.  Genitourinary: Negative for dysuria.  Musculoskeletal: Positive for arthralgias, back pain and myalgias.     Objective:  BP 128/83   Pulse 93   Temp (!) 96.8 F (36 C) (Oral)   Ht 4\' 11"  (1.499 m)   Wt 146 lb (66.2 kg)   BMI 29.49 kg/m   BP Readings from Last 3 Encounters:  08/13/17 128/83  03/26/17 132/69  09/18/16 137/80    Wt Readings from Last 3 Encounters:  08/13/17 146 lb (66.2 kg)  03/26/17 150 lb (68 kg)  09/18/16 144 lb 6 oz (65.5 kg)     Physical Exam  Constitutional: She is oriented to person, place, and time. She appears well-developed and well-nourished. No distress.  HENT:  Head: Normocephalic and atraumatic.  Eyes: Pupils are equal, round, and reactive to light. Conjunctivae and EOM are normal.  Neck: Normal range of motion. Neck supple.  Cardiovascular: Normal rate, regular rhythm and normal heart sounds.   No murmur heard. Pulmonary/Chest: Effort normal and breath sounds normal. No respiratory distress. She has no wheezes. She has no rales.  Abdominal: Soft. Bowel sounds are normal. She exhibits no distension. There is no tenderness.  Musculoskeletal: She exhibits tenderness. She exhibits no edema.       Lumbar back: She exhibits decreased range of motion, tenderness and spasm. She exhibits no deformity and normal pulse.  Neurological: She is alert and oriented to person, place, and time. She has normal reflexes.  Skin: Skin is warm and dry.  Psychiatric: She has a normal mood and affect. Her  behavior is normal. Thought content normal.   XR LS spine: advanced spondylosis with DDD.   Assessment & Plan:   Darlene was seen today for back pain.  Diagnoses and all orders for this visit:  Primary osteoarthritis involving multiple joints -     DG Lumbar Spine 2-3 Views; Future  Age related osteoporosis, unspecified pathological fracture presence       I am having Ms. Lukasik maintain her docusate sodium, Fish Oil, aspirin EC, Vitamin D, acetaminophen, cyclobenzaprine, esomeprazole, fluticasone, levothyroxine, and  spironolactone-hydrochlorothiazide.  Allergies as of 08/13/2017      Reactions   Sulfa Antibiotics Shortness Of Breath   Darvon [propoxyphene] Hives      Medication List       Accurate as of 08/13/17  1:42 PM. Always use your most recent med list.          acetaminophen 325 MG tablet Commonly known as:  TYLENOL Take 650 mg by mouth every 4 (four) hours as needed.   aspirin EC 81 MG tablet Take 81 mg by mouth daily.   cyclobenzaprine 10 MG tablet Commonly known as:  FLEXERIL Take 1 tablet (10 mg total) by mouth 3 (three) times daily as needed for muscle spasms.   docusate sodium 100 MG capsule Commonly known as:  COLACE Take 100 mg by mouth 2 (two) times daily.   esomeprazole 40 MG capsule Commonly known as:  NEXIUM Take 1 capsule (40 mg total) by mouth daily at 12 noon.   Fish Oil 1000 MG Caps Take 1 capsule by mouth daily.   fluticasone 50 MCG/ACT nasal spray Commonly known as:  FLONASE Place 1 spray into both nostrils 2 (two) times daily as needed for allergies or rhinitis.   levothyroxine 75 MCG tablet Commonly known as:  SYNTHROID, LEVOTHROID Take 1 tablet (75 mcg total) by mouth daily before breakfast.   spironolactone-hydrochlorothiazide 25-25 MG tablet Commonly known as:  ALDACTAZIDE Take 1 tablet by mouth daily.   Vitamin D 2000 units Caps Take 1 capsule by mouth.            Discharge Care Instructions        Start     Ordered   08/13/17 0000  DG Lumbar Spine 2-3 Views    Question Answer Comment  Reason for Exam (SYMPTOM  OR DIAGNOSIS REQUIRED) pain L lower bacvk radiating into leg   Preferred imaging location? Internal      08/13/17 1342       Follow-up: No Follow-up on file.  Claretta Fraise, M.D.  Son was generally belligerent. Demanding that studies be done in time for him to make another appt.Started discussing sensitive information in hallway at checkout kiosk en route from XR to exam room in presence of multiple patients. When  asked to defer discussion to the exam room he again increased his hostility.  Also demanding a left knee xray be performed after complaining that too much time was taken to do her back xr. Assumed a threatening posture, standing and hovering over provider once in exam room with clinched fists and yelling that I didn't care (apparently due to my refusal to discuss her case in front of others) also when I told him that assessment of additional concerns would require a followup appt.  while I was trying to explain the treatment plan, and that the pain was radicular from the back, and knee would be covered by the treatment plan for the back. I had to ask that he either leave or  sit back down so that I could explain treatment plan. Additionally I asked that he not give his mother oxycodone due to her advanced age and the somnolence she experienced. He replied, "I know what I am doing"

## 2017-08-30 ENCOUNTER — Ambulatory Visit (INDEPENDENT_AMBULATORY_CARE_PROVIDER_SITE_OTHER): Payer: PPO | Admitting: Family Medicine

## 2017-08-30 ENCOUNTER — Ambulatory Visit: Payer: PPO | Admitting: Family Medicine

## 2017-08-30 VITALS — BP 110/59 | HR 90 | Temp 97.1°F | Ht 59.0 in | Wt 144.0 lb

## 2017-08-30 DIAGNOSIS — I10 Essential (primary) hypertension: Secondary | ICD-10-CM

## 2017-08-30 DIAGNOSIS — K2100 Gastro-esophageal reflux disease with esophagitis, without bleeding: Secondary | ICD-10-CM

## 2017-08-30 DIAGNOSIS — M25462 Effusion, left knee: Secondary | ICD-10-CM

## 2017-08-30 DIAGNOSIS — M1710 Unilateral primary osteoarthritis, unspecified knee: Secondary | ICD-10-CM | POA: Insufficient documentation

## 2017-08-30 DIAGNOSIS — M47816 Spondylosis without myelopathy or radiculopathy, lumbar region: Secondary | ICD-10-CM

## 2017-08-30 DIAGNOSIS — N8111 Cystocele, midline: Secondary | ICD-10-CM | POA: Diagnosis not present

## 2017-08-30 MED ORDER — SPIRONOLACTONE-HCTZ 25-25 MG PO TABS: 1.0000 | ORAL_TABLET | Freq: Every day | ORAL | 0 refills | Status: DC

## 2017-08-30 MED ORDER — ESOMEPRAZOLE MAGNESIUM 20 MG PO CPDR: 20.0000 mg | DELAYED_RELEASE_CAPSULE | Freq: Every day | ORAL | 0 refills | Status: DC

## 2017-08-30 MED ORDER — MELOXICAM 7.5 MG PO TABS: 7.5000 mg | ORAL_TABLET | Freq: Every day | ORAL | 0 refills | Status: DC

## 2017-08-30 MED ORDER — TIZANIDINE HCL 2 MG PO CAPS: 2.0000 mg | ORAL_CAPSULE | Freq: Three times a day (TID) | ORAL | 0 refills | Status: DC | PRN

## 2017-08-30 MED ORDER — LEVOTHYROXINE SODIUM 75 MCG PO TABS: 75.0000 ug | ORAL_TABLET | Freq: Every day | ORAL | 3 refills | Status: DC

## 2017-08-30 NOTE — Assessment & Plan Note (Signed)
Stable. We discussed the risk of Flexeril use in this elderly patient. I have changed muscle spasm medication to Zanaflex to use up to 3 times daily as needed. I did discuss with her that this should be used sparingly and is not intended for long-term treatment of low back pain.

## 2017-08-30 NOTE — Patient Instructions (Addendum)
I placed referrals to orthopedics and to gynecology. I have also placed an order for an ultrasound of the back of your knee. I will contact you will have got the results of the study. I have sent in your medications for refill.  As we discussed, medications like Nexium can increase your risk of osteoporosis/fractures and increase her risk of infections like C. difficile. For this reason, we will trying to chew off of this medication if you can. I have decreased her dose of Nexium. I want you to follow up with me in 3 months so that we can see how you're doing on this dose.   Additionally, your blood pressure was normal today but on the lower side of normal for you. I would like you to skip her dose of blood pressure medicine today. Check your blood pressures daily.. Call me with your blood pressures on Monday and I will instruct you whether or not to continue your blood pressure medication.  If you have any blood pressures over the weekend greater than 150/90, you may take your blood pressure. Otherwise, continue to hold into we talk on Monday.  I have changed your cyclobenzaprine medication to tizanidine. This is a safer medication. You still may notice some sleepiness with this medicine but it should be less than with cyclobenzaprine. Do not take more than directed.   Baker Cyst A Baker cyst, also called a popliteal cyst, is a sac-like growth that forms at the back of the knee. The cyst forms when the fluid-filled sac (bursa) that cushions the knee joint becomes enlarged. The bursa that becomes a Baker cyst is located at the back of the knee joint. What are the causes? In most cases, a Baker cyst results from another knee problem that causes swelling inside the knee. This makes the fluid inside the knee joint (synovial fluid) flow into the bursa behind the knee, causing the bursa to enlarge. What increases the risk? You may be more likely to develop a Baker cyst if you already have a knee problem,  such as:  A tear in cartilage that cushions the knee joint (meniscal tear).  A tear in the tissues that connect the bones of the knee joint (ligament tear).  Knee swelling from osteoarthritis, rheumatoid arthritis, or gout.  What are the signs or symptoms? A Baker cyst does not always cause symptoms. A lump behind the knee may be the only sign of the condition. The lump may be painful, especially when the knee is straightened. If the lump is painful, the pain may come and go. The knee may also be stiff. Symptoms may quickly get more severe if the cyst breaks open (ruptures). If your cyst ruptures, signs and symptoms may affect the knee and the back of the lower leg (calf) and may include:  Sudden or worsening pain.  Swelling.  Bruising.  How is this diagnosed? This condition may be diagnosed based on your symptoms and medical history. Your health care provider will also do a physical exam. This may include:  Feeling the cyst to check whether it is tender.  Checking your knee for signs of another knee condition that causes swelling.  You may have imaging tests, such as:  X-rays.  MRI.  Ultrasound.  How is this treated? A Baker cyst that is not painful may go away without treatment. If the cyst gets large or painful, it will likely get better if the underlying knee problem is treated. Treatment for a Baker cyst may include:  Resting.  Keeping weight off of the knee. This means not leaning on the knee to support your body weight.  NSAIDs to reduce pain and swelling.  A procedure to drain the fluid from the cyst with a needle (aspiration). You may also get an injection of a medicine that reduces swelling (steroid).  Surgery. This may be needed if other treatments do not work. This usually involves correcting knee damage and removing the cyst.  Follow these instructions at home:  Take over-the-counter and prescription medicines only as told by your health care  provider.  Rest and return to your normal activities as told by your health care provider. Avoid activities that make pain or swelling worse. Ask your health care provider what activities are safe for you.  Keep all follow-up visits as told by your health care provider. This is important. Contact a health care provider if:  You have knee pain, stiffness, or swelling that does not get better. Get help right away if:  You have sudden or worsening pain and swelling in your calf area. This information is not intended to replace advice given to you by your health care provider. Make sure you discuss any questions you have with your health care provider. Document Released: 11/12/2005 Document Revised: 08/02/2016 Document Reviewed: 08/02/2016 Elsevier Interactive Patient Education  2018 [employer].

## 2017-08-30 NOTE — Assessment & Plan Note (Signed)
No evidence of septic joint. Her knee exam today was remarkable for moderately swollen left knee with posterior popliteal fullness. I suspect that she may have an enlarged Baker's cyst. No evidence of DVT on physical exam.  Wells score for DVT 0. I do suspect she likely has bilateral degenerative changes in her knees. Swelling may be due to this. We discussed consideration for ultrasound. This has been ordered. I have also placed a referral to orthopedics for further evaluation and possible drainage if Baker's cyst is confirmed on imaging studies. Meloxicam 7.5 mg daily as needed for pain was prescribed today. Strict return precautions and reasons for evaluation reviewed with the family.

## 2017-08-30 NOTE — Assessment & Plan Note (Signed)
Patient's blood pressure is borderline low today. It appears upon chart review that her blood pressures normally run systolics of 638V to 564P over 60s to 80s. I did recommend that she not take her antihypertensive medication today. I discussed with her son and her granddaughter that she should have daily blood pressures documented. If her blood pressure is over 150/90, she may take her blood pressure medication. Otherwise, continue to hold medication and call on Monday with recorded blood pressures. If she remains at goal, I will likely discontinue this medication as she would be at an increased risk for adverse events.

## 2017-08-30 NOTE — Progress Notes (Signed)
Subjective: CC: chronic low back pain, knee pain PCP: Claretta Fraise, MD NTI:RWERX Sellinger is a 83 y.o. female presenting to clinic today for:  Knee pain/ Chronic low back pain Patient was seen on 08/13/2017 for a 3 day history of worsening low back pain, with radiation to the left. She has low back imaging performed which revealed multilevel degenerative changes in the spine. She was discharged with a prednisone taper for symptoms.  She is accompanied today by her son, who reports that left knee seems more swollen than previous exam. Patient reports that knee is painful to bend. She denies increased warmth or erythema. She denies increased Calf girth. No recent travel. Prednisone did not help knee pain. No imaging studies have been performed for knee pain.  Her back pain did improve after steroid taper. She notes she continues to have some low back pain that is relieved by Flexeril, which she takes each night at bedtime. She denies falls or excessive sedation. No breathing difficulty.  Hypertension Patient reports that she takes spironolactone/HCTZ for blood pressure daily. She has not taken today's dose. No chest pain, shortness of breath, lower extremity swelling. Her son notes that she is a good eater. She is very active when she is not having back and knee pain. She often works in the garden and walks regularly.  GERD Patient denies history of GI bleed. She does report that she has had acid reflux for several years. She has been on Nexium for some time now. She notes that she has not been trialed off of medication to see if her symptoms would still be present. No nausea, vomiting, abdominal pain. No known osteoporotic fractures. No history of C. difficile infection. She reports chronic constipation relieved by stool softener.  Vaginal concern Patient reports that she sometimes feels like her bladder is coming through her vagina. She notes that she saw a specialist in [place] about 8  years ago, who fitted her with a pessary and talked to her about surgical intervention. She reports that the pessary was too large and was discontinued. She does report that sometimes she has incontinence and that sometimes she is unable to empty her bladder fully. She has not seen a specialist since the one in [place]. No abnormal vaginal bleeding.  Allergies  Allergen Reactions  . Sulfa Antibiotics Shortness Of Breath  . Darvon [Propoxyphene] Hives   Past Medical History:  Diagnosis Date  . GERD (gastroesophageal reflux disease)   . Thyroid disease    No family history on file. Social Hx: non smoker.Current medications reviewed.    Health Maintenance: flu shot   ROS: Per HPI  Objective: Office vital signs reviewed. BP (!) 110/59   Pulse 90   Temp (!) 97.1 F (36.2 C) (Oral)   Ht 4\' 11"  (1.499 m)   Wt 144 lb (65.3 kg)   BMI 29.08 kg/m   Physical Examination:  General: Awake, alert, thin elderly female, No acute distress Cardio: regular rate and rhythm, no murmurs appreciated Pulm: no wheezes, normal work of breathing on room air GU: external vaginal tissue atrophic, possible small, red mass appreciated within the vaginal vault. This could not be found again after insertion of speculum. cervix retroverted, no punctate lesions on cervix appreciated, small discharge from cervical os, no bleeding, probable cystocele appreciated with coughing, no palpable abdominal/ adnexal masses Extremities: warm, well perfused, No edema, cyanosis or clubbing; +1 pulses bilaterally MSK: antalgic gait  Spine: Marked kyphosis of thoracic spine appreciated. She has mild  bilateral paraspinal tenderness to palpation of the lumbosacral spine. No midline tenderness to palpation of the lumbar spine. Active range of motion is limited secondary to degenerative changes.  Left knee: Moderate swelling of the left knee appreciated. No increased warmth. No erythema. Active range of motion limited in both  extension and flexion of the left knee secondary to pain and swelling. Mild tenderness to palpation to the anterior and medial knee. She also has tenderness to palpation to the posterior popliteal fossa. There is also notable fullness in the popliteal fossa compared to the right side. Bilateral knees with bony changes consistent with degenerative joints. Thessaly not performed secondary to discomfort. No evidence of ligamentous laxity. Skin: dry; intact; no rashes or lesions Neuro: Slight decreased light touch sensation in the inferior aspect of the left lower leg.  Assessment/ Plan: 83 y.o. female   Degenerative arthritis of spine Stable. We discussed the risk of Flexeril use in this elderly patient. I have changed muscle spasm medication to Zanaflex to use up to 3 times daily as needed. I did discuss with her that this should be used sparingly and is not intended for long-term treatment of low back pain.    Swelling of joint, knee, left No evidence of septic joint. Her knee exam today was remarkable for moderately swollen left knee with posterior popliteal fullness. I suspect that she may have an enlarged Baker's cyst. No evidence of DVT on physical exam.  Wells score for DVT 0. I do suspect she likely has bilateral degenerative changes in her knees. Swelling may be due to this. We discussed consideration for ultrasound. This has been ordered. I have also placed a referral to orthopedics for further evaluation and possible drainage if Baker's cyst is confirmed on imaging studies. Meloxicam 7.5 mg daily as needed for pain was prescribed today. Strict return precautions and reasons for evaluation reviewed with the family.  Essential hypertension Patient's blood pressure is borderline low today. It appears upon chart review that her blood pressures normally run systolics of 073X to 106Y over 60s to 80s. I did recommend that she not take her antihypertensive medication today. I discussed with her son and  her granddaughter that she should have daily blood pressures documented. If her blood pressure is over 150/90, she may take her blood pressure medication. Otherwise, continue to hold medication and call on Monday with recorded blood pressures. If she remains at goal, I will likely discontinue this medication as she would be at an increased risk for adverse events.  Gastroesophageal reflux disease with esophagitis Per patient report, no history of GI bleed. I did review with the patient and her son today that she is at increased risk of osteoporosis and fracture and infection with chronic use of PPI. Had decreased the Nexium to 20 mg daily. A 3 month supply was provided. Goal will be to titrate patient completely off of PPI as able. Patient is amenable to this.  Cystocele, midline Pelvic exam revealed fullness with increased abdominal pressure. Because symptoms are interfering with patient's daily life, I have placed a referral to gynecology for further evaluation and possible fitting for a pessary. I also saw what I thought was a small, red mass within the vaginal canal. This may be a polyp. This was not excised during today's exam. Will defer to gynecology for further evaluation and consideration of biopsy if clinically appropriate.   Orders Placed This Encounter  Procedures  . Ambulatory referral to Orthopedic Surgery    Referral Priority:  Urgent    Referral Type:   Surgical    Referral Reason:   Specialty Services Required    Requested Specialty:   Orthopedic Surgery    Number of Visits Requested:   1  . Ambulatory referral to Gynecology    Referral Priority:   Routine    Referral Type:   Consultation    Referral Reason:   Specialty Services Required    Requested Specialty:   Gynecology    Number of Visits Requested:   1   Medications Discontinued During This Encounter  Medication Reason  . predniSONE (STERAPRED UNI-PAK 21 TAB) 10 MG (21) TBPK tablet Error  . levothyroxine (SYNTHROID,  LEVOTHROID) 75 MCG tablet Reorder  . esomeprazole (NEXIUM) 40 MG capsule Reorder  . spironolactone-hydrochlorothiazide (ALDACTAZIDE) 25-25 MG tablet Reorder  . cyclobenzaprine (FLEXERIL) 10 MG tablet    Meds ordered this encounter  Medications  . levothyroxine (SYNTHROID, LEVOTHROID) 75 MCG tablet    Sig: Take 1 tablet (75 mcg total) by mouth daily before breakfast.    Dispense:  90 tablet    Refill:  3  . esomeprazole (NEXIUM) 20 MG capsule    Sig: Take 1 capsule (20 mg total) by mouth daily at 12 noon.    Dispense:  90 capsule    Refill:  0  . spironolactone-hydrochlorothiazide (ALDACTAZIDE) 25-25 MG tablet    Sig: Take 1 tablet by mouth daily.    Dispense:  90 tablet    Refill:  0  . tizanidine (ZANAFLEX) 2 MG capsule    Sig: Take 1 capsule (2 mg total) by mouth 3 (three) times daily as needed for muscle spasms.    Dispense:  60 capsule    Refill:  0  . meloxicam (MOBIC) 7.5 MG tablet    Sig: Take 1 tablet (7.5 mg total) by mouth daily.    Dispense:  14 tablet    Refill:  Mason, DO [place] [phone]

## 2017-08-30 NOTE — Assessment & Plan Note (Signed)
Pelvic exam revealed fullness with increased abdominal pressure. Because symptoms are interfering with patient's daily life, I have placed a referral to gynecology for further evaluation and possible fitting for a pessary. I also saw what I thought was a small, red mass within the vaginal canal. This may be a polyp. This was not excised during today's exam. Will defer to gynecology for further evaluation and consideration of biopsy if clinically appropriate.

## 2017-08-30 NOTE — Assessment & Plan Note (Signed)
Per patient report, no history of GI bleed. I did review with the patient and her son today that she is at increased risk of osteoporosis and fracture and infection with chronic use of PPI. Had decreased the Nexium to 20 mg daily. A 3 month supply was provided. Goal will be to titrate patient completely off of PPI as able. Patient is amenable to this.

## 2017-09-02 ENCOUNTER — Telehealth: Payer: Self-pay

## 2017-09-02 NOTE — Telephone Encounter (Signed)
BP's Saturday AM 106/62  Noon 97/64 Bedtime 123/64  Sunday AM 142/67 Bedtime 114/69 Monday AM  132/79 Is not taking BP Meds

## 2017-09-02 NOTE — Telephone Encounter (Signed)
Spoke to patient and her son.  BPs are looking good off of medication.  For now, we will discontinue the Spironolactone/ HCTZ and plan to follow up in 2 weeks for Blood pressure.  She will monitor her BP daily.  If she has any BPs> 150/90, she will call.  Additionally, she notes she is doing well w/ the medications prescribed to her at last visit.  She has an appt with Raliegh Ip on Thursday.  Ultrasound still waiting to be scheduled.  Hopefully, we can get that done before her Thursday appt w/ ortho.

## 2017-09-05 DIAGNOSIS — M25562 Pain in left knee: Secondary | ICD-10-CM | POA: Diagnosis not present

## 2017-09-10 ENCOUNTER — Ambulatory Visit (INDEPENDENT_AMBULATORY_CARE_PROVIDER_SITE_OTHER): Payer: PPO | Admitting: Family Medicine

## 2017-09-10 ENCOUNTER — Encounter: Payer: Self-pay | Admitting: Family Medicine

## 2017-09-10 VITALS — BP 128/78 | HR 80 | Temp 97.1°F | Ht 59.0 in | Wt 146.0 lb

## 2017-09-10 DIAGNOSIS — M47816 Spondylosis without myelopathy or radiculopathy, lumbar region: Secondary | ICD-10-CM

## 2017-09-10 DIAGNOSIS — M1712 Unilateral primary osteoarthritis, left knee: Secondary | ICD-10-CM

## 2017-09-10 DIAGNOSIS — Z23 Encounter for immunization: Secondary | ICD-10-CM

## 2017-09-10 DIAGNOSIS — I10 Essential (primary) hypertension: Secondary | ICD-10-CM

## 2017-09-10 MED ORDER — PREDNISONE 10 MG PO TABS: ORAL_TABLET | ORAL | 0 refills | Status: DC

## 2017-09-10 MED ORDER — TIZANIDINE HCL 2 MG PO CAPS: 2.0000 mg | ORAL_CAPSULE | Freq: Three times a day (TID) | ORAL | 1 refills | Status: DC | PRN

## 2017-09-10 MED ORDER — MELOXICAM 7.5 MG PO TABS: 7.5000 mg | ORAL_TABLET | Freq: Every day | ORAL | 1 refills | Status: DC

## 2017-09-10 NOTE — Progress Notes (Signed)
Subjective: CC:" hurting all over"; BP follow up HPI: Samantha Woods is a 83 y.o. female, who is accompanied to visit by her son. She is presenting to clinic today for:  1. Polyarthralgia Patient was seen for left knee pain by Dr. French Ana on 09/05/2017. He diagnosed her with bone on bone tricompartmental degenerative disease. Surgical intervention was offered but patient and family declined. Instead, a corticosteroid injection was performed. Patient notes that symptoms have improved for a couple of days but have returned. She reports left knee pain, left hip pain and left back pain. She reports that pain is fairly constant but she does have occasional episodes, maybe 2-3 times per month, where pain is debilitating and impaired her ability to walk. No falls. Swelling is slightly improved of her knee. No numbness or tingling. Her son is asking if a steroid Dosepak could be considered as this has worked well for her in the past.  2. Hypertension Patient reports Blood pressure at home: Controlled; Meds: Spironolactone recently discontinued secondary to borderline hypertension.   ROS: Denies headache, dizziness, visual changes, nausea, vomiting, chest pain, LE swelling (except knee), abdominal pain or shortness of breath.  Social Hx: non smoker.  History reviewed. No pertinent family history. Past Medical History:  Diagnosis Date  . GERD (gastroesophageal reflux disease)   . Thyroid disease    Allergies  Allergen Reactions  . Sulfa Antibiotics Shortness Of Breath  . Darvon [Propoxyphene] Hives   Medications reviewed.   Health Maintenance: Flu Vaccine: yes; Pneumonia Vaccine: yes [id number]  ROS: Per HPI  Objective: Office vital signs reviewed. BP 128/78   Pulse 80   Temp (!) 97.1 F (36.2 C) (Oral)   Ht 4\' 11"  (1.499 m)   Wt 146 lb (66.2 kg)   BMI 29.49 kg/m   Physical Examination:  General: Awake, alert, well nourished, elderly female, No acute distress Cardio: regular rate, no  murmurs appreciated Pulm: clear to auscultation bilaterally, no wheezes, rhonchi or rales; normal work of breathing on room air Extremities: warm, No edema, cyanosis or clubbing; +1 pulses bilaterally  Left knee: Swelling has improved moderately since last evaluation. She continues to have palpable bony abnormalities consistent with arthritis. Posterior popliteal fossa with less significant fullness compared to previous exam. She continues to have a small joint effusion. No increased warmth. Active range of motion limited in extension of the knee. MSK: Antalgic gait, relies on cane for ambulation Neuro:Light touch sensation grossly intact.   Assessment/ Plan: 83 y.o. female   Tricompartmental disease of knee Reviewed report sent by Dr. French Ana, orthopedist. Per imaging report, patient has tricompartmental disease of the knee. He offered surgical intervention but this was declined by patient and her family. She is status post corticosteroid injection to the left knee. We did review that regular steroid therapy for polyarthralgia is not ideal and increase her risk for osteoporosis/fracture and endocrine abnormalities. I agreed to prescribe a prednisone Dosepak 1 week. Patient to hold meloxicam while on this medication. Low impact physical activity was recommended and reviewed with the patient. Return precautions and reasons for evaluation or reviewed with the patient. They voiced understanding.  Degenerative arthritis of spine Prednisone taper. May resume meloxicam once taper has been completed to use daily as needed with a meal. I reinforced that this is an as needed medication and nontender for daily use. If any evidence of GI bleed, medication is to be discontinued immediately.  Essential hypertension Blood pressure is very well controlled off of medication. She is  completely asymptomatic and doing well at home. No dizziness or falls. Continue monitoring blood pressure daily as directed. Follow up  in 3 months.   Janora Norlander, DO [place] [phone]

## 2017-09-10 NOTE — Assessment & Plan Note (Signed)
Blood pressure is very well controlled off of medication. She is completely asymptomatic and doing well at home. No dizziness or falls. Continue monitoring blood pressure daily as directed. Follow up in 3 months.

## 2017-09-10 NOTE — Assessment & Plan Note (Signed)
Reviewed report sent by Dr. French Ana, orthopedist. Per imaging report, patient has tricompartmental disease of the knee. He offered surgical intervention but this was declined by patient and her family. She is status post corticosteroid injection to the left knee. We did review that regular steroid therapy for polyarthralgia is not ideal and increase her risk for osteoporosis/fracture and endocrine abnormalities. I agreed to prescribe a prednisone Dosepak 1 week. Patient to hold meloxicam while on this medication. Low impact physical activity was recommended and reviewed with the patient. Return precautions and reasons for evaluation or reviewed with the patient. They voiced understanding.

## 2017-09-10 NOTE — Assessment & Plan Note (Signed)
Prednisone taper. May resume meloxicam once taper has been completed to use daily as needed with a meal. I reinforced that this is an as needed medication and nontender for daily use. If any evidence of GI bleed, medication is to be discontinued immediately.

## 2017-09-10 NOTE — Patient Instructions (Signed)
As we discussed, long-term steroid use for arthritis is not an ideal option as it increases her risk for osteoporosis/fractures and blood pressure problems. I'm giving you a short course of steroids to help with current pain flare. Do not take the meloxicam while taking this medication.  I'm so glad to see that your blood pressure is doing better off of the medication. Continued not to take this medication. Continue monitoring her blood pressure daily as we discussed.   Arthritis Arthritis is a term that is commonly used to refer to joint pain or joint disease. There are more than 100 types of arthritis. What are the causes? The most common cause of this condition is wear and tear of a joint. Other causes include:  Gout.  Inflammation of a joint.  An infection of a joint.  Sprains and other injuries near the joint.  A drug reaction or allergic reaction.  In some cases, the cause may not be known. What are the signs or symptoms? The main symptom of this condition is pain in the joint with movement. Other symptoms include:  Redness, swelling, or stiffness at a joint.  Warmth coming from the joint.  Fever.  Overall feeling of illness.  How is this diagnosed? This condition may be diagnosed with a physical exam and tests, including:  Blood tests.  Urine tests.  Imaging tests, such as MRI, X-rays, or a CT scan.  Sometimes, fluid is removed from a joint for testing. How is this treated? Treatment for this condition may involve:  Treatment of the cause, if it is known.  Rest.  Raising (elevating) the joint.  Applying cold or hot packs to the joint.  Medicines to improve symptoms and reduce inflammation.  Injections of a steroid such as cortisone into the joint to help reduce pain and inflammation.  Depending on the cause of your arthritis, you may need to make lifestyle changes to reduce stress on your joint. These changes may include exercising more and losing  weight. Follow these instructions at home: Medicines  Take over-the-counter and prescription medicines only as told by your health care provider.  Do not take aspirin to relieve pain if gout is suspected. Activity  Rest your joint if told by your health care provider. Rest is important when your disease is active and your joint feels painful, swollen, or stiff.  Avoid activities that make the pain worse. It is important to balance activity with rest.  Exercise your joint regularly with range-of-motion exercises as told by your health care provider. Try doing low-impact exercise, such as: ? Swimming. ? Water aerobics. ? Biking. ? Walking. Joint Care   If your joint is swollen, keep it elevated if told by your health care provider.  If your joint feels stiff in the morning, try taking a warm shower.  If directed, apply heat to the joint. If you have diabetes, do not apply heat without permission from your health care provider. ? Put a towel between the joint and the hot pack or heating pad. ? Leave the heat on the area for 20-30 minutes.  If directed, apply ice to the joint: ? Put ice in a plastic bag. ? Place a towel between your skin and the bag. ? Leave the ice on for 20 minutes, 2-3 times per day.  Keep all follow-up visits as told by your health care provider. This is important. Contact a health care provider if:  The pain gets worse.  You have a fever. Get help right  away if:  You develop severe joint pain, swelling, or redness.  Many joints become painful and swollen.  You develop severe back pain.  You develop severe weakness in your leg.  You cannot control your bladder or bowels. This information is not intended to replace advice given to you by your health care provider. Make sure you discuss any questions you have with your health care provider. Document Released: 12/20/2004 Document Revised: 04/19/2016 Document Reviewed: 02/07/2015 Elsevier Interactive  Patient Education  [employer].

## 2017-09-11 ENCOUNTER — Telehealth: Payer: Self-pay | Admitting: Family Medicine

## 2017-09-11 NOTE — Telephone Encounter (Signed)
Son aware how patient is supposed to take her prednisone and verbalizes understanding.

## 2017-09-13 ENCOUNTER — Encounter: Payer: Self-pay | Admitting: Obstetrics & Gynecology

## 2017-09-13 ENCOUNTER — Ambulatory Visit (INDEPENDENT_AMBULATORY_CARE_PROVIDER_SITE_OTHER): Payer: PPO | Admitting: Obstetrics & Gynecology

## 2017-09-13 VITALS — BP 150/70 | HR 60 | Wt 148.0 lb

## 2017-09-13 DIAGNOSIS — N362 Urethral caruncle: Secondary | ICD-10-CM

## 2017-09-13 DIAGNOSIS — N816 Rectocele: Secondary | ICD-10-CM

## 2017-09-13 NOTE — Progress Notes (Signed)
Chief Complaint  Patient presents with  . new gyn    cystocele      83 y.o. G3P3 No LMP recorded. Patient is postmenopausal. The current method of family planning is status post hysterectomy.  Outpatient Encounter Prescriptions as of 09/13/2017  Medication Sig  . acetaminophen (TYLENOL) 325 MG tablet Take 650 mg by mouth every 4 (four) hours as needed.  Marland Kitchen aspirin EC 81 MG tablet Take 81 mg by mouth daily.  . Cholecalciferol (VITAMIN D) 2000 UNITS CAPS Take 1 capsule by mouth.  . docusate sodium (COLACE) 100 MG capsule Take 100 mg by mouth 2 (two) times daily.  Marland Kitchen esomeprazole (NEXIUM) 20 MG capsule Take 1 capsule (20 mg total) by mouth daily at 12 noon.  . fluticasone (FLONASE) 50 MCG/ACT nasal spray Place 1 spray into both nostrils 2 (two) times daily as needed for allergies or rhinitis.  Marland Kitchen levothyroxine (SYNTHROID, LEVOTHROID) 75 MCG tablet Take 1 tablet (75 mcg total) by mouth daily before breakfast.  . meloxicam (MOBIC) 7.5 MG tablet Take 1 tablet (7.5 mg total) by mouth daily. As needed for arthritic pain  . Omega-3 Fatty Acids (FISH OIL) 1000 MG CAPS Take 1 capsule by mouth daily.  . predniSONE (DELTASONE) 10 MG tablet Take 60mg  by mouth day 1, 50mg  day 2, 40mg  day 3, 30mg  day 4, 20mg  day 5, 10mg  day 6.  Then stop.  . tizanidine (ZANAFLEX) 2 MG capsule Take 1 capsule (2 mg total) by mouth 3 (three) times daily as needed for muscle spasms.   No facility-administered encounter medications on file as of 09/13/2017.     Subjective Samantha Woods is referred here by her PCP, Dr Lajuana Ripple for evaluation of a cystocoele She was told 6 years ago she had pelvic prolapse and had a pessary inserted for a very short time, not one since She does not have any urinary complaints, she reports occasional constipation and takes something daily as a laxative and stool softener She denies pain bleeding of discharge Past Medical History:  Diagnosis Date  . GERD (gastroesophageal reflux  disease)   . Thyroid disease     Past Surgical History:  Procedure Laterality Date  . ABDOMINAL HYSTERECTOMY    . GALLBLADDER SURGERY      OB History    Gravida Para Term Preterm AB Living   3 3           SAB TAB Ectopic Multiple Live Births                  Allergies  Allergen Reactions  . Sulfa Antibiotics Shortness Of Breath  . Darvon [Propoxyphene] Hives    Social History   Social History  . Marital status: Widowed    Spouse name: N/A  . Number of children: N/A  . Years of education: N/A   Social History Main Topics  . Smoking status: Never Smoker  . Smokeless tobacco: Never Used  . Alcohol use 0.0 oz/week     Comment: occasional  . Drug use: No  . Sexual activity: Not Asked   Other Topics Concern  . None   Social History Narrative  . None    No family history on file.  Medications:       Current Outpatient Prescriptions:  .  acetaminophen (TYLENOL) 325 MG tablet, Take 650 mg by mouth every 4 (four) hours as needed., Disp: , Rfl:  .  aspirin EC 81 MG tablet, Take 81 mg by mouth daily.,  Disp: , Rfl:  .  Cholecalciferol (VITAMIN D) 2000 UNITS CAPS, Take 1 capsule by mouth., Disp: , Rfl:  .  docusate sodium (COLACE) 100 MG capsule, Take 100 mg by mouth 2 (two) times daily., Disp: , Rfl:  .  esomeprazole (NEXIUM) 20 MG capsule, Take 1 capsule (20 mg total) by mouth daily at 12 noon., Disp: 90 capsule, Rfl: 0 .  fluticasone (FLONASE) 50 MCG/ACT nasal spray, Place 1 spray into both nostrils 2 (two) times daily as needed for allergies or rhinitis., Disp: 48 g, Rfl: 3 .  levothyroxine (SYNTHROID, LEVOTHROID) 75 MCG tablet, Take 1 tablet (75 mcg total) by mouth daily before breakfast., Disp: 90 tablet, Rfl: 3 .  meloxicam (MOBIC) 7.5 MG tablet, Take 1 tablet (7.5 mg total) by mouth daily. As needed for arthritic pain, Disp: 30 tablet, Rfl: 1 .  Omega-3 Fatty Acids (FISH OIL) 1000 MG CAPS, Take 1 capsule by mouth daily., Disp: , Rfl:  .  predniSONE (DELTASONE) 10  MG tablet, Take 60mg  by mouth day 1, 50mg  day 2, 40mg  day 3, 30mg  day 4, 20mg  day 5, 10mg  day 6.  Then stop., Disp: 21 tablet, Rfl: 0 .  tizanidine (ZANAFLEX) 2 MG capsule, Take 1 capsule (2 mg total) by mouth 3 (three) times daily as needed for muscle spasms., Disp: 60 capsule, Rfl: 1  Objective Blood pressure (!) 150/70, pulse 60, weight 148 lb (67.1 kg).  General elderly WDWN female NAD Skin warm dry no rashes or lesions Abdomen soft non tender no masses normal exam Vulva:  normal appearing vulva with no masses, tenderness or lesions, atrophic Vagina:  Urethral caruncle, no cystocoele, moderate recotocoele no vaginal apex prolapse mucosa is atrophic no erythema no lesions re noted Cervix:  absent Uterus:  uterus absent Adnexa: ovaries:present,  Not palpable   Pertinent ROS No burning with urination, frequency or urgency No nausea, vomiting or diarrhea Nor fever chills or other constitutional symptoms   Labs or studies No new studies to review    Impression Diagnoses this Encounter::   ICD-10-CM   1. Urethral caruncle N36.2   2. Rectocele N81.6     Established relevant diagnosis(es):   Plan/Recommendations: No orders of the defined types were placed in this encounter.   Labs or Scans Ordered: No orders of the defined types were placed in this encounter.   Management:: Recommend using miralax as a daily stool softener No pessary is needed for her rectocele, there is no cystocoele and the vaginal apex is well supported No treatment needed for urethral caruncle  Follow up if needed no scheduled follow up is necessary  Follow up Return if symptoms worsen or fail to improve.     All questions were answered.

## 2017-09-18 ENCOUNTER — Emergency Department (HOSPITAL_COMMUNITY): Payer: PPO

## 2017-09-18 ENCOUNTER — Telehealth: Payer: Self-pay | Admitting: Family Medicine

## 2017-09-18 ENCOUNTER — Inpatient Hospital Stay (HOSPITAL_COMMUNITY)
Admission: EM | Admit: 2017-09-18 | Discharge: 2017-09-24 | DRG: 689 | Disposition: A | Discharge status: Skilled Nursing Facility | Payer: PPO | Attending: Family Medicine | Admitting: Family Medicine

## 2017-09-18 ENCOUNTER — Encounter (HOSPITAL_COMMUNITY): Payer: Self-pay

## 2017-09-18 DIAGNOSIS — B961 Klebsiella pneumoniae [K. pneumoniae] as the cause of diseases classified elsewhere: Secondary | ICD-10-CM | POA: Diagnosis not present

## 2017-09-18 DIAGNOSIS — M7918 Myalgia, other site: Secondary | ICD-10-CM | POA: Diagnosis present

## 2017-09-18 DIAGNOSIS — K219 Gastro-esophageal reflux disease without esophagitis: Secondary | ICD-10-CM | POA: Diagnosis not present

## 2017-09-18 DIAGNOSIS — M25552 Pain in left hip: Secondary | ICD-10-CM | POA: Diagnosis not present

## 2017-09-18 DIAGNOSIS — G92 Toxic encephalopathy: Secondary | ICD-10-CM | POA: Diagnosis not present

## 2017-09-18 DIAGNOSIS — Z79890 Hormone replacement therapy (postmenopausal): Secondary | ICD-10-CM | POA: Diagnosis not present

## 2017-09-18 DIAGNOSIS — W19XXXA Unspecified fall, initial encounter: Secondary | ICD-10-CM | POA: Diagnosis present

## 2017-09-18 DIAGNOSIS — Z66 Do not resuscitate: Secondary | ICD-10-CM | POA: Diagnosis present

## 2017-09-18 DIAGNOSIS — G40909 Epilepsy, unspecified, not intractable, without status epilepticus: Secondary | ICD-10-CM

## 2017-09-18 DIAGNOSIS — D72829 Elevated white blood cell count, unspecified: Secondary | ICD-10-CM | POA: Diagnosis not present

## 2017-09-18 DIAGNOSIS — R7989 Other specified abnormal findings of blood chemistry: Secondary | ICD-10-CM

## 2017-09-18 DIAGNOSIS — I10 Essential (primary) hypertension: Secondary | ICD-10-CM | POA: Diagnosis present

## 2017-09-18 DIAGNOSIS — Z79899 Other long term (current) drug therapy: Secondary | ICD-10-CM

## 2017-09-18 DIAGNOSIS — R569 Unspecified convulsions: Secondary | ICD-10-CM

## 2017-09-18 DIAGNOSIS — R296 Repeated falls: Secondary | ICD-10-CM

## 2017-09-18 DIAGNOSIS — M25511 Pain in right shoulder: Secondary | ICD-10-CM | POA: Diagnosis not present

## 2017-09-18 DIAGNOSIS — R29898 Other symptoms and signs involving the musculoskeletal system: Secondary | ICD-10-CM | POA: Diagnosis not present

## 2017-09-18 DIAGNOSIS — M6281 Muscle weakness (generalized): Secondary | ICD-10-CM | POA: Diagnosis not present

## 2017-09-18 DIAGNOSIS — I161 Hypertensive emergency: Secondary | ICD-10-CM | POA: Diagnosis not present

## 2017-09-18 DIAGNOSIS — S42031D Displaced fracture of lateral end of right clavicle, subsequent encounter for fracture with routine healing: Secondary | ICD-10-CM | POA: Diagnosis not present

## 2017-09-18 DIAGNOSIS — G9340 Encephalopathy, unspecified: Secondary | ICD-10-CM | POA: Diagnosis not present

## 2017-09-18 DIAGNOSIS — Z8249 Family history of ischemic heart disease and other diseases of the circulatory system: Secondary | ICD-10-CM

## 2017-09-18 DIAGNOSIS — R29818 Other symptoms and signs involving the nervous system: Secondary | ICD-10-CM | POA: Diagnosis not present

## 2017-09-18 DIAGNOSIS — S79912A Unspecified injury of left hip, initial encounter: Secondary | ICD-10-CM | POA: Diagnosis not present

## 2017-09-18 DIAGNOSIS — S42021A Displaced fracture of shaft of right clavicle, initial encounter for closed fracture: Secondary | ICD-10-CM | POA: Diagnosis present

## 2017-09-18 DIAGNOSIS — J9811 Atelectasis: Secondary | ICD-10-CM | POA: Diagnosis not present

## 2017-09-18 DIAGNOSIS — Z881 Allergy status to other antibiotic agents status: Secondary | ICD-10-CM

## 2017-09-18 DIAGNOSIS — S199XXA Unspecified injury of neck, initial encounter: Secondary | ICD-10-CM | POA: Diagnosis not present

## 2017-09-18 DIAGNOSIS — Z7982 Long term (current) use of aspirin: Secondary | ICD-10-CM | POA: Diagnosis not present

## 2017-09-18 DIAGNOSIS — Z7951 Long term (current) use of inhaled steroids: Secondary | ICD-10-CM | POA: Diagnosis not present

## 2017-09-18 DIAGNOSIS — Z90710 Acquired absence of both cervix and uterus: Secondary | ICD-10-CM

## 2017-09-18 DIAGNOSIS — Z885 Allergy status to narcotic agent status: Secondary | ICD-10-CM | POA: Diagnosis not present

## 2017-09-18 DIAGNOSIS — S42031A Displaced fracture of lateral end of right clavicle, initial encounter for closed fracture: Secondary | ICD-10-CM

## 2017-09-18 DIAGNOSIS — R7303 Prediabetes: Secondary | ICD-10-CM | POA: Diagnosis not present

## 2017-09-18 DIAGNOSIS — R262 Difficulty in walking, not elsewhere classified: Secondary | ICD-10-CM | POA: Diagnosis not present

## 2017-09-18 DIAGNOSIS — E876 Hypokalemia: Secondary | ICD-10-CM | POA: Diagnosis not present

## 2017-09-18 DIAGNOSIS — E039 Hypothyroidism, unspecified: Secondary | ICD-10-CM | POA: Diagnosis not present

## 2017-09-18 DIAGNOSIS — R740 Nonspecific elevation of levels of transaminase and lactic acid dehydrogenase [LDH]: Secondary | ICD-10-CM | POA: Diagnosis not present

## 2017-09-18 DIAGNOSIS — S43429A Sprain of unspecified rotator cuff capsule, initial encounter: Secondary | ICD-10-CM

## 2017-09-18 DIAGNOSIS — R410 Disorientation, unspecified: Secondary | ICD-10-CM | POA: Diagnosis not present

## 2017-09-18 DIAGNOSIS — R5381 Other malaise: Secondary | ICD-10-CM

## 2017-09-18 DIAGNOSIS — R4182 Altered mental status, unspecified: Secondary | ICD-10-CM

## 2017-09-18 DIAGNOSIS — Z9181 History of falling: Secondary | ICD-10-CM | POA: Diagnosis not present

## 2017-09-18 DIAGNOSIS — R402411 Glasgow coma scale score 13-15, in the field [EMT or ambulance]: Secondary | ICD-10-CM | POA: Diagnosis not present

## 2017-09-18 DIAGNOSIS — N12 Tubulo-interstitial nephritis, not specified as acute or chronic: Principal | ICD-10-CM | POA: Diagnosis present

## 2017-09-18 DIAGNOSIS — M542 Cervicalgia: Secondary | ICD-10-CM | POA: Diagnosis not present

## 2017-09-18 DIAGNOSIS — S72009A Fracture of unspecified part of neck of unspecified femur, initial encounter for closed fracture: Secondary | ICD-10-CM

## 2017-09-18 DIAGNOSIS — N10 Acute tubulo-interstitial nephritis: Secondary | ICD-10-CM | POA: Diagnosis not present

## 2017-09-18 DIAGNOSIS — I6523 Occlusion and stenosis of bilateral carotid arteries: Secondary | ICD-10-CM | POA: Diagnosis not present

## 2017-09-18 HISTORY — DX: Essential (primary) hypertension: I10

## 2017-09-18 LAB — COMPREHENSIVE METABOLIC PANEL
ALT: 47 U/L (ref 14–54)
AST: 35 U/L (ref 15–41)
Albumin: 3.5 g/dL (ref 3.5–5.0)
Alkaline Phosphatase: 86 U/L (ref 38–126)
Anion gap: 10 (ref 5–15)
BILIRUBIN TOTAL: 1.2 mg/dL (ref 0.3–1.2)
BUN: 15 mg/dL (ref 6–20)
CALCIUM: 9 mg/dL (ref 8.9–10.3)
CO2: 29 mmol/L (ref 22–32)
CREATININE: 0.98 mg/dL (ref 0.44–1.00)
Chloride: 101 mmol/L (ref 101–111)
GFR, EST AFRICAN AMERICAN: 55 mL/min — AB (ref >60)
GFR, EST NON AFRICAN AMERICAN: 48 mL/min — AB (ref >60)
Glucose, Bld: 166 mg/dL — ABNORMAL HIGH (ref 65–99)
Potassium: 3.4 mmol/L — ABNORMAL LOW (ref 3.5–5.1)
Sodium: 140 mmol/L (ref 135–145)
Total Protein: 6.6 g/dL (ref 6.5–8.1)

## 2017-09-18 LAB — CSF CELL COUNT WITH DIFFERENTIAL
RBC Count, CSF: 0 /mm3
RBC Count, CSF: 1 /mm3 — ABNORMAL HIGH
TUBE #: 4
Tube #: 1
WBC CSF: 2 /mm3 (ref 0–5)
WBC, CSF: 1 /mm3 (ref 0–5)

## 2017-09-18 LAB — CBC
HCT: 46.5 % — ABNORMAL HIGH (ref 36.0–46.0)
Hemoglobin: 15.5 g/dL — ABNORMAL HIGH (ref 12.0–15.0)
MCH: 30.6 pg (ref 26.0–34.0)
MCHC: 33.3 g/dL (ref 30.0–36.0)
MCV: 91.9 fL (ref 78.0–100.0)
Platelets: 389 10*3/uL (ref 150–400)
RBC: 5.06 MIL/uL (ref 3.87–5.11)
RDW: 15.3 % (ref 11.5–15.5)
WBC: 22.8 10*3/uL — ABNORMAL HIGH (ref 4.0–10.5)

## 2017-09-18 LAB — URINALYSIS, ROUTINE W REFLEX MICROSCOPIC
BACTERIA UA: NONE SEEN
BILIRUBIN URINE: NEGATIVE
Glucose, UA: 50 mg/dL — AB
Ketones, ur: NEGATIVE mg/dL
Leukocytes, UA: NEGATIVE
NITRITE: NEGATIVE
PH: 8 (ref 5.0–8.0)
Protein, ur: NEGATIVE mg/dL
SPECIFIC GRAVITY, URINE: 1.017 (ref 1.005–1.030)
Squamous Epithelial / LPF: NONE SEEN

## 2017-09-18 LAB — CBG MONITORING, ED: Glucose-Capillary: 150 mg/dL — ABNORMAL HIGH (ref 65–99)

## 2017-09-18 LAB — I-STAT TROPONIN, ED: Troponin i, poc: 0.01 ng/mL (ref 0.00–0.08)

## 2017-09-18 LAB — CK: Total CK: 219 U/L (ref 38–234)

## 2017-09-18 LAB — GLUCOSE, CSF: GLUCOSE CSF: 72 mg/dL — AB (ref 40–70)

## 2017-09-18 LAB — PROTEIN, CSF: Total  Protein, CSF: 62 mg/dL — ABNORMAL HIGH (ref 15–45)

## 2017-09-18 LAB — I-STAT CG4 LACTIC ACID, ED: Lactic Acid, Venous: 2.34 mmol/L (ref 0.5–1.9)

## 2017-09-18 LAB — AMMONIA: AMMONIA: 30 umol/L (ref 9–35)

## 2017-09-18 LAB — LACTIC ACID, PLASMA: LACTIC ACID, VENOUS: 2.3 mmol/L — AB (ref 0.5–1.9)

## 2017-09-18 LAB — CRYPTOCOCCAL ANTIGEN, CSF: Crypto Ag: NEGATIVE

## 2017-09-18 LAB — PROCALCITONIN: Procalcitonin: <0.1 ng/mL

## 2017-09-18 MED ORDER — VANCOMYCIN HCL IN DEXTROSE 1-5 GM/200ML-% IV SOLN: 1000.0000 mg | Freq: Once | INTRAVENOUS | Status: DC

## 2017-09-18 MED ORDER — LEVOTHYROXINE SODIUM 75 MCG PO TABS
75.0000 ug | ORAL_TABLET | Freq: Every day | ORAL | Status: DC
  Administered 2017-09-20 – 2017-09-24 (×5): 75 ug via ORAL
  Filled 2017-09-18 (×5): qty 1

## 2017-09-18 MED ORDER — HALOPERIDOL LACTATE 5 MG/ML IJ SOLN
5.0000 mg | Freq: Four times a day (QID) | INTRAMUSCULAR | Status: DC | PRN
  Administered 2017-09-18 – 2017-09-19 (×3): 5 mg via INTRAMUSCULAR
  Filled 2017-09-18 (×3): qty 1

## 2017-09-18 MED ORDER — SODIUM CHLORIDE 0.9 % IV SOLN
INTRAVENOUS | Status: DC
  Administered 2017-09-18: 14:00:00 via INTRAVENOUS

## 2017-09-18 MED ORDER — LORAZEPAM 2 MG/ML IJ SOLN: 2.0000 mg | INTRAMUSCULAR | Status: DC | PRN

## 2017-09-18 MED ORDER — ONDANSETRON HCL 4 MG/2ML IJ SOLN: 4.0000 mg | Freq: Four times a day (QID) | INTRAMUSCULAR | Status: DC | PRN

## 2017-09-18 MED ORDER — SODIUM CHLORIDE 0.9 % IV SOLN
1000.0000 mg | INTRAVENOUS | Status: AC
  Administered 2017-09-18: 1000 mg via INTRAVENOUS
  Filled 2017-09-18: qty 10

## 2017-09-18 MED ORDER — HYDRALAZINE HCL 20 MG/ML IJ SOLN: 5.0000 mg | INTRAMUSCULAR | Status: DC | PRN

## 2017-09-18 MED ORDER — ACETAMINOPHEN 10 MG/ML IV SOLN: 1000.0000 mg | Freq: Four times a day (QID) | INTRAVENOUS | Status: DC | PRN

## 2017-09-18 MED ORDER — LORAZEPAM 2 MG/ML IJ SOLN
1.0000 mg | Freq: Once | INTRAMUSCULAR | Status: AC
  Administered 2017-09-18: 1 mg via INTRAVENOUS
  Filled 2017-09-18: qty 1

## 2017-09-18 MED ORDER — SODIUM CHLORIDE 0.9 % IV BOLUS (SEPSIS)
1000.0000 mL | Freq: Once | INTRAVENOUS | Status: AC
Start: 2017-09-18 — End: 2017-09-18
  Administered 2017-09-18: 1000 mL via INTRAVENOUS

## 2017-09-18 MED ORDER — PANTOPRAZOLE SODIUM 40 MG PO TBEC
40.0000 mg | DELAYED_RELEASE_TABLET | Freq: Every day | ORAL | Status: DC
  Administered 2017-09-20 – 2017-09-24 (×5): 40 mg via ORAL
  Filled 2017-09-18 (×5): qty 1

## 2017-09-18 MED ORDER — SODIUM CHLORIDE 0.9 % IV SOLN
500.0000 mg | Freq: Two times a day (BID) | INTRAVENOUS | Status: DC
  Administered 2017-09-19 – 2017-09-20 (×3): 500 mg via INTRAVENOUS
  Filled 2017-09-18 (×4): qty 5

## 2017-09-18 MED ORDER — AZITHROMYCIN 250 MG PO TABS: ORAL_TABLET | ORAL | 0 refills | Status: DC

## 2017-09-18 MED ORDER — PIPERACILLIN-TAZOBACTAM 3.375 G IVPB 30 MIN: 3.3750 g | Freq: Once | INTRAVENOUS | Status: DC

## 2017-09-18 MED ORDER — LORAZEPAM 2 MG/ML IJ SOLN
0.5000 mg | Freq: Once | INTRAMUSCULAR | Status: AC
  Administered 2017-09-18: 0.5 mg via INTRAVENOUS
  Filled 2017-09-18: qty 1

## 2017-09-18 MED ORDER — VANCOMYCIN HCL 10 G IV SOLR
1250.0000 mg | Freq: Once | INTRAVENOUS | Status: AC
  Administered 2017-09-18: 1250 mg via INTRAVENOUS
  Filled 2017-09-18: qty 1250

## 2017-09-18 MED ORDER — DEXTROSE 5 % IV SOLN
450.0000 mg | Freq: Three times a day (TID) | INTRAVENOUS | Status: DC
  Administered 2017-09-18 – 2017-09-19 (×3): 450 mg via INTRAVENOUS
  Filled 2017-09-18 (×4): qty 9

## 2017-09-18 MED ORDER — SODIUM CHLORIDE 0.45 % IV SOLN
INTRAVENOUS | Status: DC
  Administered 2017-09-18 – 2017-09-20 (×3): via INTRAVENOUS

## 2017-09-18 MED ORDER — ONDANSETRON HCL 4 MG PO TABS: 4.0000 mg | ORAL_TABLET | Freq: Four times a day (QID) | ORAL | Status: DC | PRN

## 2017-09-18 MED ORDER — VANCOMYCIN HCL IN DEXTROSE 1-5 GM/200ML-% IV SOLN
1000.0000 mg | INTRAVENOUS | Status: DC
  Administered 2017-09-19: 1000 mg via INTRAVENOUS
  Filled 2017-09-18: qty 200

## 2017-09-18 MED ORDER — ASPIRIN EC 81 MG PO TBEC
81.0000 mg | DELAYED_RELEASE_TABLET | Freq: Every day | ORAL | Status: DC
  Administered 2017-09-20 – 2017-09-24 (×5): 81 mg via ORAL
  Filled 2017-09-18 (×5): qty 1

## 2017-09-18 MED ORDER — DEXTROSE 5 % IV SOLN
2.0000 g | Freq: Two times a day (BID) | INTRAVENOUS | Status: DC
  Administered 2017-09-18 – 2017-09-19 (×3): 2 g via INTRAVENOUS
  Filled 2017-09-18 (×4): qty 2

## 2017-09-18 MED ORDER — SODIUM CHLORIDE 0.9 % IV SOLN
75.0000 mL/h | INTRAVENOUS | Status: DC
  Administered 2017-09-18 – 2017-09-24 (×7): 75 mL/h via INTRAVENOUS

## 2017-09-18 MED ORDER — DEXTROSE 5 % IV SOLN
500.0000 mg | Freq: Three times a day (TID) | INTRAVENOUS | Status: DC | PRN
  Filled 2017-09-18: qty 5

## 2017-09-18 MED ORDER — IOPAMIDOL (ISOVUE-370) INJECTION 76%
INTRAVENOUS | Status: AC
  Filled 2017-09-18: qty 100

## 2017-09-18 MED ORDER — DOCUSATE SODIUM 100 MG PO CAPS: 100.0000 mg | ORAL_CAPSULE | Freq: Two times a day (BID) | ORAL | Status: DC

## 2017-09-18 MED ORDER — ACETAMINOPHEN 500 MG PO TABS: 1000.0000 mg | ORAL_TABLET | Freq: Four times a day (QID) | ORAL | Status: DC | PRN

## 2017-09-18 MED ORDER — SODIUM CHLORIDE 0.9 % IV SOLN
2.0000 g | Freq: Four times a day (QID) | INTRAVENOUS | Status: DC
  Administered 2017-09-18 – 2017-09-19 (×4): 2 g via INTRAVENOUS
  Filled 2017-09-18 (×6): qty 2000

## 2017-09-18 NOTE — Progress Notes (Signed)
Patient returned to the unit from MRI. MRI was unable to complete d/t restlessness. Patient suspected to have reverse affects from ativan given earlier. Patient is now resting with eyes closed. MRI is updated about patient calm and sleeping. They are waiting until tomorrow d/t restlessness during procedure. Will cont to montor.

## 2017-09-18 NOTE — ED Notes (Addendum)
Family made aware of pts bed assignment 

## 2017-09-18 NOTE — ED Triage Notes (Signed)
Pt arrives via EMS from home where she lives with family after an unwitnessed fall this morning. EMS reports pt was found on ground beside bed this morning confused with reports of right shoulder pain. LSW was yesterday at unknown time according to EMS. EMS reports unable to preform NIH d/t pt confusion and not following commands. Family reported to EMS confusion is new for pt and at baseline she is A&Ox 4 and ambulatory. PT has not been ambulatory since fall. Unknown if pt his head or is on blood thinners at this time.   On arrival to ED pt is only oriented to place and not verbally responding to any other questions.

## 2017-09-18 NOTE — Code Documentation (Signed)
83yo female arriving to [facility] via [facility] at 1159.  Patient from home where she was found confused this morning, LKW yesterday at 2000 prior to bed.  Of note, patient was taken off antihypertensives a few weeks ago d/t hypotension.  Patient noted to have aphasia and inability to follow commands in the ED.  Patient to CT.  Code stroke not paged out, however, stroke team notified at 1306 and responded to CT.  NIHSS 15, see documentation for details and code stroke times.  Patient nonverbal and unable to follow commands on exam.  CTA head and neck and CTP completed per MD order.  CTA negative for LVO and CTP with normal perfusion exam.  Patient is outside the window for treatment with tPA.  No acute stroke treatment at this time.  Code stroke canceled.  Handoff with ED RN Vikki Ports.

## 2017-09-18 NOTE — ED Notes (Signed)
Family at bedside at this time speaking with Dr. Ashok Cordia

## 2017-09-18 NOTE — Consult Note (Signed)
Neurology Consultation Reason for Consult: Altered Mental Status Referring Physician: Rosine Abe  CC: Altered Mental Status  History is obtained from:patien  HPI: Samantha Woods is a 83 y.o. female with a history of htn who presents with altered mental status.  She apparently was fairly sleepy, possibly with few balance issues but otherwise relatively normal when she went to bed last night.  Over the course of the morning, she has been very lethargic and confused.  She is brought into the emergency department where she was found to be nonverbal therefore code stroke was activated.  CT perfusion was performed which did not demonstrate any evidence of stroke.  Initially she was quite hypertensive, and this was repeatedly found, though there is some question that this might of been cuff positioning.  Due to severe altered mental status without explanation, she was started on empiric antibiotics and an LP was performed.  Prior to the LP being performed, she did develop seizure activity though I am not certain of the semiology.  She was given 1.5 mg of Ativan, the LP was performed.  LKW: 8pm last night.  tpa given?: no, out of window.     ROS: A 14 point ROS was performed and is negative except as noted in the HPI.   Past Medical History:  Diagnosis Date  . GERD (gastroesophageal reflux disease)   . Hypertension   . Thyroid disease     FHx: Unable to obtain due to altered mental status.  Social History:  reports that she has never smoked. She has never used smokeless tobacco. She reports that she drinks alcohol. She reports that she does not use drugs.   Exam: Current vital signs: BP (!) 188/111   Pulse 96   Temp 97.9 F (36.6 C) (Oral)   Resp (!) 24   Wt 69.9 kg (154 lb 1.6 oz)   SpO2 99%   BMI 31.12 kg/m  Vital signs in last 24 hours: Temp:  [97.9 F (36.6 C)] 97.9 F (36.6 C) (10/24 1230) Pulse Rate:  [96] 96 (10/24 1230) Resp:  [24] 24 (10/24 1230) BP: (188)/(111)  188/111 (10/24 1230) SpO2:  [99 %] 99 % (10/24 1230) Weight:  [67.1 kg (148 lb)-69.9 kg (154 lb 1.6 oz)] 69.9 kg (154 lb 1.6 oz) (10/24 1309)   Physical Exam  Constitutional: Appears elderly Psych: Does not answer questions Eyes: No scleral injection HENT: No OP obstrucion Head: Normocephalic.  Cardiovascular: Normal rate and regular rhythm.  Respiratory: Effort normal GI: Soft.  No distension. There is no tenderness.  Skin: WDI  Neuro: Mental Status: Patient is awake, he is agitated, appears restless.  He does not answer questions Cranial Nerves: II: She does not reliably blink to threat pupils are equal, round, and reactive to light.   III,IV, VI: She does appear to cross midline, but does not fixate or track laterally V: Facial sensation is symmetric to stimulation VII: Facial movement is symmetric.  VIII, X, XI, XII: Unable to assess secondary to patient's altered mental status.  Motor: She moves all extremities purposefully and with relatively symmetric strength.  She does not move her legs as much as her arms Sensory: She does withdraw to noxious stimulation in all 4 extremities Cerebellar: She does not perform  I have reviewed labs in epic and the results pertinent to this consultation are: CSF-2 WBC, RBC-1, protein 62, glucose 72  I have reviewed the images obtained: CT head/perfusion - negative  Impression: 83 year old female with altered mental status.  The degree of her confusion is concerning.  It is possible that she has sepsis of some unclear etiology causing the septic encephalopathy, but this seems to be relatively rapid.  Multifocal strokes would be another possibility, an MRI could help with this.  Hypertensive encephalopathy(e.g. PRES) could be considered, though I am uncertain how reliable the initial blood pressures were.  It is possible that she had prolonged seizure earlier, with a postictal state currently, but no evidence of ongoing seizures on  EEG.  Recommendations: 1) MRI brain 2) continue Keppra 500mg  BID 3) TSH, Ammonia 4) we will follow.    Roland Rack, MD [facility] [phone]  If 7pm- 7am, please page neurology on call as listed in [place].

## 2017-09-18 NOTE — Progress Notes (Signed)
CRITICAL VALUE ALERT  Critical Value:  Lactid Acid 2.3  Date & Time Notied:  09/18/17 22:09  Provider Notified: Dr Hal Hope  Orders Received/Actions taken: chart reviewed, monitor

## 2017-09-18 NOTE — ED Provider Notes (Signed)
[facility] EMERGENCY DEPARTMENT Provider Note   CSN: [id number] Arrival date & time: 09/18/17  1159     History   Chief Complaint Chief Complaint  Patient presents with  . Fall  . Altered Mental Status    HPI Samantha Woods is a 83 y.o. female.  Patient with hx htn, presents via ems with altered mental status/confusion.  Patients family reports last evening, around 8 pm, they last saw patient in her normal/baseline states of health and mental state.  This AM, her son, with whom she lives, checked on her shortly prior to ED arrival, and she was lying on ground aside of her bed,   He indicates she could say a word or two, but appeared very confused/altered. Level 5 caveat - altered mental status.    The history is provided by the patient, a relative and the EMS personnel. The history is limited by the condition of the patient.  Fall   Altered Mental Status      Past Medical History:  Diagnosis Date  . GERD (gastroesophageal reflux disease)   . Hypertension   . Thyroid disease     Patient Active Problem List   Diagnosis Date Noted  . Cystocele, midline 08/30/2017  . Tricompartmental disease of knee 08/30/2017  . Thyroid activity decreased 01/23/2015  . Gastroesophageal reflux disease with esophagitis 01/23/2015  . Essential hypertension 01/23/2015  . Degenerative arthritis of spine 01/23/2015  . Prediabetes 01/23/2015    Past Surgical History:  Procedure Laterality Date  . ABDOMINAL HYSTERECTOMY    . GALLBLADDER SURGERY      OB History    Gravida Para Term Preterm AB Living   3 3           SAB TAB Ectopic Multiple Live Births                   Home Medications    Prior to Admission medications   Medication Sig Start Date End Date Taking? Authorizing Provider  acetaminophen (TYLENOL) 325 MG tablet Take 650 mg by mouth every 4 (four) hours as needed.    [provider]  aspirin EC 81 MG tablet Take 81 mg by mouth daily.     [provider]  Cholecalciferol (VITAMIN D) 2000 UNITS CAPS Take 1 capsule by mouth.    [provider]  docusate sodium (COLACE) 100 MG capsule Take 100 mg by mouth 2 (two) times daily.    [provider]  esomeprazole (NEXIUM) 20 MG capsule Take 1 capsule (20 mg total) by mouth daily at 12 noon. 08/30/17   Janora Norlander, DO  fluticasone (FLONASE) 50 MCG/ACT nasal spray Place 1 spray into both nostrils 2 (two) times daily as needed for allergies or rhinitis. 04/02/17   Claretta Fraise, MD  levothyroxine (SYNTHROID, LEVOTHROID) 75 MCG tablet Take 1 tablet (75 mcg total) by mouth daily before breakfast. 08/30/17   Ronnie Doss M, DO  meloxicam (MOBIC) 7.5 MG tablet Take 1 tablet (7.5 mg total) by mouth daily. As needed for arthritic pain 09/10/17   Ronnie Doss M, DO  Omega-3 Fatty Acids (FISH OIL) 1000 MG CAPS Take 1 capsule by mouth daily.    [provider]  predniSONE (DELTASONE) 10 MG tablet Take 60mg  by mouth day 1, 50mg  day 2, 40mg  day 3, 30mg  day 4, 20mg  day 5, 10mg  day 6.  Then stop. 09/10/17   Janora Norlander, DO  tizanidine (ZANAFLEX) 2 MG capsule Take 1 capsule (2  mg total) by mouth 3 (three) times daily as needed for muscle spasms. 09/10/17   Janora Norlander, DO    Family History No family history on file.  Social History Social History  Substance Use Topics  . Smoking status: Never Smoker  . Smokeless tobacco: Never Used  . Alcohol use 0.0 oz/week     Comment: occasional     Allergies   Sulfa antibiotics and Darvon [propoxyphene]   Review of Systems Review of Systems  Unable to perform ROS: Mental status change  Constitutional: Negative for fever.  level 5 caveat - altered mental status.    Physical Exam Updated Vital Signs BP (!) 188/111   Pulse 96   Resp (!) 24   Wt 69.9 kg (154 lb 1.6 oz)   SpO2 99%   BMI 31.12 kg/m   Physical Exam  Constitutional: She appears well-developed and well-nourished. No  distress.  HENT:  Superficial abrasion to right cheek and chin.  Eyes: Pupils are equal, round, and reactive to light. Conjunctivae are normal. No scleral icterus.  Neck: Neck supple. No tracheal deviation present.  No bruits.   Cardiovascular: Normal rate, regular rhythm, normal heart sounds and intact distal pulses.  Exam reveals no gallop and no friction rub.   No murmur heard. Pulmonary/Chest: Effort normal and breath sounds normal. No respiratory distress.  Abdominal: Soft. Normal appearance and bowel sounds are normal. She exhibits no distension. There is no tenderness.  Genitourinary:  Genitourinary Comments: No cva tenderness.  Musculoskeletal: She exhibits no edema.  Neurological: She is alert.  Awake and alert appearing, eyes open. Speaks 1 word at a time, occasionally, in response to questions. Moves bil ext but wont follow commands.   Skin: Skin is warm and dry. No rash noted.  Psychiatric:  Slow to respond.   Nursing note and vitals reviewed.    ED Treatments / Results  Labs (all labs ordered are listed, but only abnormal results are displayed) Labs Reviewed  CBC - Abnormal; Notable for the following:       Result Value   WBC 22.8 (*)    Hemoglobin 15.5 (*)    HCT 46.5 (*)    All other components within normal limits  COMPREHENSIVE METABOLIC PANEL - Abnormal; Notable for the following:    Potassium 3.4 (*)    Glucose, Bld 166 (*)    GFR calc non Af Amer 48 (*)    GFR calc Af Amer 55 (*)    All other components within normal limits  URINALYSIS, ROUTINE W REFLEX MICROSCOPIC - Abnormal; Notable for the following:    Color, Urine STRAW (*)    Glucose, UA 50 (*)    Hgb urine dipstick SMALL (*)    All other components within normal limits  CBG MONITORING, ED - Abnormal; Notable for the following:    Glucose-Capillary 150 (*)    All other components within normal limits  I-STAT CG4 LACTIC ACID, ED - Abnormal; Notable for the following:    Lactic Acid, Venous 2.34  (*)    All other components within normal limits  CULTURE, BLOOD (ROUTINE X 2)  CULTURE, BLOOD (ROUTINE X 2)  URINE CULTURE  CSF CULTURE  GRAM STAIN  HSV CULTURE AND TYPING  CK  CSF CELL COUNT WITH DIFFERENTIAL  CSF CELL COUNT WITH DIFFERENTIAL  GLUCOSE, CSF  PROTEIN, CSF  HERPES SIMPLEX VIRUS(HSV) DNA BY PCR  CRYPTOCOCCAL ANTIGEN, CSF  I-STAT TROPONIN, ED  I-STAT CG4 LACTIC ACID, ED   Results  for orders placed or performed during the hospital encounter of 09/18/17  CBC  Result Value Ref Range   WBC 22.8 (H) 4.0 - 10.5 K/uL   RBC 5.06 3.87 - 5.11 MIL/uL   Hemoglobin 15.5 (H) 12.0 - 15.0 g/dL   HCT 46.5 (H) 36.0 - 46.0 %   MCV 91.9 78.0 - 100.0 fL   MCH 30.6 26.0 - 34.0 pg   MCHC 33.3 30.0 - 36.0 g/dL   RDW 15.3 11.5 - 15.5 %   Platelets 389 150 - 400 K/uL  Comprehensive metabolic panel  Result Value Ref Range   Sodium 140 135 - 145 mmol/L   Potassium 3.4 (L) 3.5 - 5.1 mmol/L   Chloride 101 101 - 111 mmol/L   CO2 29 22 - 32 mmol/L   Glucose, Bld 166 (H) 65 - 99 mg/dL   BUN 15 6 - 20 mg/dL   Creatinine, Ser 0.98 0.44 - 1.00 mg/dL   Calcium 9.0 8.9 - 10.3 mg/dL   Total Protein 6.6 6.5 - 8.1 g/dL   Albumin 3.5 3.5 - 5.0 g/dL   AST 35 15 - 41 U/L   ALT 47 14 - 54 U/L   Alkaline Phosphatase 86 38 - 126 U/L   Total Bilirubin 1.2 0.3 - 1.2 mg/dL   GFR calc non Af Amer 48 (L) >60 mL/min   GFR calc Af Amer 55 (L) >60 mL/min   Anion gap 10 5 - 15  Urinalysis, Routine w reflex microscopic  Result Value Ref Range   Color, Urine STRAW (A) YELLOW   APPearance CLEAR CLEAR   Specific Gravity, Urine 1.017 1.005 - 1.030   pH 8.0 5.0 - 8.0   Glucose, UA 50 (A) NEGATIVE mg/dL   Hgb urine dipstick SMALL (A) NEGATIVE   Bilirubin Urine NEGATIVE NEGATIVE   Ketones, ur NEGATIVE NEGATIVE mg/dL   Protein, ur NEGATIVE NEGATIVE mg/dL   Nitrite NEGATIVE NEGATIVE   Leukocytes, UA NEGATIVE NEGATIVE   RBC / HPF 6-30 0 - 5 RBC/hpf   WBC, UA 0-5 0 - 5 WBC/hpf   Bacteria, UA NONE SEEN NONE  SEEN   Squamous Epithelial / LPF NONE SEEN NONE SEEN  CK  Result Value Ref Range   Total CK 219 38 - 234 U/L  I-stat troponin, ED  Result Value Ref Range   Troponin i, poc 0.01 0.00 - 0.08 ng/mL   Comment 3          CBG monitoring, ED  Result Value Ref Range   Glucose-Capillary 150 (H) 65 - 99 mg/dL  I-Stat CG4 Lactic Acid, ED  (not at  [facility])  Result Value Ref Range   Lactic Acid, Venous 2.34 (HH) 0.5 - 1.9 mmol/L   Comment NOTIFIED PHYSICIAN    Ct Angio Head W Or Wo Contrast  Result Date: 09/18/2017 CLINICAL DATA:  Code stroke.  Found on the floor.  Disoriented. EXAM: CT ANGIOGRAPHY HEAD AND NECK CT PERFUSION BRAIN TECHNIQUE: Multidetector CT imaging of the head and neck was performed using the standard protocol during bolus administration of intravenous contrast. Multiplanar CT image reconstructions and MIPs were obtained to evaluate the vascular anatomy. Carotid stenosis measurements (when applicable) are obtained utilizing NASCET criteria, using the distal internal carotid diameter as the denominator. Multiphase CT imaging of the brain was performed following IV bolus contrast injection. Subsequent parametric perfusion maps were calculated using RAPID software. CONTRAST:  50 cc Isovue 350 COMPARISON:  CT same day FINDINGS: CTA NECK FINDINGS Aortic arch: Atherosclerosis of  the arch. No aneurysm or dissection. Branching pattern of the brachiocephalic vessels is normal without origin stenosis. Right carotid system: Common carotid artery is tortuous but widely patent to the bifurcation region. Carotid bifurcation shows atherosclerotic plaque but no stenosis or irregularity. Cervical ICA is tortuous but widely patent. Left carotid system: Common carotid artery widely patent to the bifurcation region. The vessel is tortuous. Atherosclerotic plaque at the carotid bifurcation and ICA bulb but no narrowing of the ICA beyond the diameter of the more distal cervical ICA and therefore no stenosis.  Vertebral arteries: Both vertebral artery origins are widely patent. Both vertebral arteries are widely patent through the cervical region to the foramen magnum. Skeleton: Ordinary cervical spondylosis. Other neck: No mass or lymphadenopathy. Upper chest: Chronic appearing pulmonary scarring. No active process identified. Review of the MIP images confirms the above findings CTA HEAD FINDINGS Anterior circulation: Both internal carotid arteries are patent through the skullbase and siphon regions. No siphon stenosis. The anterior and middle cerebral vessels are normal without proximal stenosis, aneurysm or vascular malformation. Posterior circulation: Both vertebral arteries are patent through the foramen magnum. Mild fusiform dilatation of the left vertebral artery at the dural penetration without focal aneurysm. Both vertebral arteries widely patent to the basilar. No basilar stenosis. Posterior circulation branch vessels are normal. Venous sinuses: Patent and normal. Anatomic variants: None significant Delayed phase: No abnormal enhancement Review of the MIP images confirms the above findings CT Brain Perfusion Findings: CBF (<30%) Volume: 30mL Perfusion (Tmax>6.0s) volume: 72mL Mismatch Volume: 38mL Infarction Location:None IMPRESSION: Normal perfusion exam. CT angiography shows ordinary mild atherosclerosis of the aortic arch in both carotid bifurcations but no stenosis or irregularity. No intracranial abnormal vascular finding. These results were called by telephone at the time of interpretation on 09/18/2017 at 1:35 pm to Dr. Roland Rack , who verbally acknowledged these results. Electronically Signed   By: Nelson Chimes M.D.   On: 09/18/2017 13:37   Ct Angio Neck W Or Wo Contrast  Result Date: 09/18/2017 CLINICAL DATA:  Code stroke.  Found on the floor.  Disoriented. EXAM: CT ANGIOGRAPHY HEAD AND NECK CT PERFUSION BRAIN TECHNIQUE: Multidetector CT imaging of the head and neck was performed using the  standard protocol during bolus administration of intravenous contrast. Multiplanar CT image reconstructions and MIPs were obtained to evaluate the vascular anatomy. Carotid stenosis measurements (when applicable) are obtained utilizing NASCET criteria, using the distal internal carotid diameter as the denominator. Multiphase CT imaging of the brain was performed following IV bolus contrast injection. Subsequent parametric perfusion maps were calculated using RAPID software. CONTRAST:  50 cc Isovue 350 COMPARISON:  CT same day FINDINGS: CTA NECK FINDINGS Aortic arch: Atherosclerosis of the arch. No aneurysm or dissection. Branching pattern of the brachiocephalic vessels is normal without origin stenosis. Right carotid system: Common carotid artery is tortuous but widely patent to the bifurcation region. Carotid bifurcation shows atherosclerotic plaque but no stenosis or irregularity. Cervical ICA is tortuous but widely patent. Left carotid system: Common carotid artery widely patent to the bifurcation region. The vessel is tortuous. Atherosclerotic plaque at the carotid bifurcation and ICA bulb but no narrowing of the ICA beyond the diameter of the more distal cervical ICA and therefore no stenosis. Vertebral arteries: Both vertebral artery origins are widely patent. Both vertebral arteries are widely patent through the cervical region to the foramen magnum. Skeleton: Ordinary cervical spondylosis. Other neck: No mass or lymphadenopathy. Upper chest: Chronic appearing pulmonary scarring. No active process identified. Review of the  MIP images confirms the above findings CTA HEAD FINDINGS Anterior circulation: Both internal carotid arteries are patent through the skullbase and siphon regions. No siphon stenosis. The anterior and middle cerebral vessels are normal without proximal stenosis, aneurysm or vascular malformation. Posterior circulation: Both vertebral arteries are patent through the foramen magnum. Mild  fusiform dilatation of the left vertebral artery at the dural penetration without focal aneurysm. Both vertebral arteries widely patent to the basilar. No basilar stenosis. Posterior circulation branch vessels are normal. Venous sinuses: Patent and normal. Anatomic variants: None significant Delayed phase: No abnormal enhancement Review of the MIP images confirms the above findings CT Brain Perfusion Findings: CBF (<30%) Volume: 50mL Perfusion (Tmax>6.0s) volume: 21mL Mismatch Volume: 60mL Infarction Location:None IMPRESSION: Normal perfusion exam. CT angiography shows ordinary mild atherosclerosis of the aortic arch in both carotid bifurcations but no stenosis or irregularity. No intracranial abnormal vascular finding. These results were called by telephone at the time of interpretation on 09/18/2017 at 1:35 pm to Dr. Roland Rack , who verbally acknowledged these results. Electronically Signed   By: Nelson Chimes M.D.   On: 09/18/2017 13:37   Ct Cervical Spine Wo Contrast  Result Date: 09/18/2017 CLINICAL DATA:  83 year old female with acute neck pain following fall. Initial encounter. EXAM: CT CERVICAL SPINE WITHOUT CONTRAST TECHNIQUE: Multidetector CT imaging of the cervical spine was performed without intravenous contrast. Multiplanar CT image reconstructions were also generated. COMPARISON:  None. FINDINGS: Alignment: Normal. Skull base and vertebrae: No acute fracture. No primary bone lesion or focal pathologic process. Soft tissues and spinal canal: No prevertebral fluid or swelling. No visible canal hematoma. Disc levels: Moderate multilevel degenerative disc disease, spondylosis and facet arthropathy noted contributing to mild central and bony foraminal narrowing at multiple levels. Upper chest: No acute abnormality Other: None. IMPRESSION: 1. No evidence that no static evidence of acute injury to the cervical spine 2. Moderate multilevel degenerative changes. Electronically Signed   By: Margarette Canada M.D.   On: 09/18/2017 13:25   Ct Cerebral Perfusion W Contrast  Result Date: 09/18/2017 CLINICAL DATA:  Code stroke.  Found on the floor.  Disoriented. EXAM: CT ANGIOGRAPHY HEAD AND NECK CT PERFUSION BRAIN TECHNIQUE: Multidetector CT imaging of the head and neck was performed using the standard protocol during bolus administration of intravenous contrast. Multiplanar CT image reconstructions and MIPs were obtained to evaluate the vascular anatomy. Carotid stenosis measurements (when applicable) are obtained utilizing NASCET criteria, using the distal internal carotid diameter as the denominator. Multiphase CT imaging of the brain was performed following IV bolus contrast injection. Subsequent parametric perfusion maps were calculated using RAPID software. CONTRAST:  50 cc Isovue 350 COMPARISON:  CT same day FINDINGS: CTA NECK FINDINGS Aortic arch: Atherosclerosis of the arch. No aneurysm or dissection. Branching pattern of the brachiocephalic vessels is normal without origin stenosis. Right carotid system: Common carotid artery is tortuous but widely patent to the bifurcation region. Carotid bifurcation shows atherosclerotic plaque but no stenosis or irregularity. Cervical ICA is tortuous but widely patent. Left carotid system: Common carotid artery widely patent to the bifurcation region. The vessel is tortuous. Atherosclerotic plaque at the carotid bifurcation and ICA bulb but no narrowing of the ICA beyond the diameter of the more distal cervical ICA and therefore no stenosis. Vertebral arteries: Both vertebral artery origins are widely patent. Both vertebral arteries are widely patent through the cervical region to the foramen magnum. Skeleton: Ordinary cervical spondylosis. Other neck: No mass or lymphadenopathy. Upper chest: Chronic appearing pulmonary  scarring. No active process identified. Review of the MIP images confirms the above findings CTA HEAD FINDINGS Anterior circulation: Both internal  carotid arteries are patent through the skullbase and siphon regions. No siphon stenosis. The anterior and middle cerebral vessels are normal without proximal stenosis, aneurysm or vascular malformation. Posterior circulation: Both vertebral arteries are patent through the foramen magnum. Mild fusiform dilatation of the left vertebral artery at the dural penetration without focal aneurysm. Both vertebral arteries widely patent to the basilar. No basilar stenosis. Posterior circulation branch vessels are normal. Venous sinuses: Patent and normal. Anatomic variants: None significant Delayed phase: No abnormal enhancement Review of the MIP images confirms the above findings CT Brain Perfusion Findings: CBF (<30%) Volume: 76mL Perfusion (Tmax>6.0s) volume: 58mL Mismatch Volume: 87mL Infarction Location:None IMPRESSION: Normal perfusion exam. CT angiography shows ordinary mild atherosclerosis of the aortic arch in both carotid bifurcations but no stenosis or irregularity. No intracranial abnormal vascular finding. These results were called by telephone at the time of interpretation on 09/18/2017 at 1:35 pm to Dr. Roland Rack , who verbally acknowledged these results. Electronically Signed   By: Nelson Chimes M.D.   On: 09/18/2017 13:37   Dg Chest Port 1 View  Result Date: 09/18/2017 CLINICAL DATA:  Altered mental status EXAM: PORTABLE CHEST 1 VIEW COMPARISON:  None. FINDINGS: Heart is borderline enlarged. Mild vascular congestion. Left basilar opacity likely reflects atelectasis. No effusions or acute bony abnormality. IMPRESSION: Borderline cardiomegaly, vascular congestion. Left base atelectasis. Electronically Signed   By: Rolm Baptise M.D.   On: 09/18/2017 13:54   Ct Head Code Stroke Wo Contrast  Result Date: 09/18/2017 CLINICAL DATA:  Code stroke. Found on the floor this morning. Confusion. EXAM: CT HEAD WITHOUT CONTRAST TECHNIQUE: Contiguous axial images were obtained from the base of the skull  through the vertex without intravenous contrast. COMPARISON:  None. FINDINGS: Brain: Generalized atrophy. Chronic small-vessel ischemic changes of the white matter. No sign of acute infarction, mass lesion, hemorrhage, hydrocephalus or extra-axial collection. Vascular: There is atherosclerotic calcification of the major vessels at the base of the brain. Skull: Negative Sinuses/Orbits: Clear/normal Other: None ASPECTS (Scott Stroke Program Early CT Score) - Ganglionic level infarction (caudate, lentiform nuclei, internal capsule, insula, M1-M3 cortex): 7 - Supraganglionic infarction (M4-M6 cortex): 3 Total score (0-10 with 10 being normal): 10 IMPRESSION: 1. Atrophy and chronic small-vessel ischemic changes. No acute or subacute finding. 2. ASPECTS is 10. These results were called by telephone at the time of interpretation on 09/18/2017 at 1:10 pm to Dr. Leonel Ramsay, who verbally acknowledged these results. Electronically Signed   By: Nelson Chimes M.D.   On: 09/18/2017 13:13     EKG  EKG Interpretation  Date/Time:  Wednesday September 18 2017 12:21:56 EDT Ventricular Rate:  96 PR Interval:    QRS Duration: 91 QT Interval:  355 QTC Calculation: 449 R Axis:   -11 Text Interpretation:  Sinus tachycardia Premature ventricular complexes Nonspecific ST abnormality Confirmed by Lajean Saver [phone]) on 09/18/2017 12:30:34 PM       Radiology No results found.  Procedures .Lumbar Puncture Date/Time: 09/18/2017 3:20 PM Performed by: Lajean Saver Authorized by: Lajean Saver   Consent:    Consent obtained:  Emergent situation   Consent given by:  Guardian Pre-procedure details:    Procedure purpose:  Diagnostic Sedation:    Sedation type:  Anxiolysis Anesthesia (see MAR for exact dosages):    Anesthesia method:  Local infiltration Procedure details:    Lumbar space:  L4-L5 interspace  Patient position:  R lateral decubitus   Needle gauge:  22   Ultrasound guidance: no     Number of  attempts:  1   Opening pressure (cm H2O):  8   Fluid appearance:  Clear   Tubes of fluid:  4   Total volume (ml):  7 Post-procedure:    Puncture site:  Adhesive bandage applied   Patient tolerance of procedure:  Tolerated well, no immediate complications   (including critical care time)  Medications Ordered in ED Medications  0.9 %  sodium chloride infusion (not administered)     Initial Impression / Assessment and Plan / ED Course  I have reviewed the triage vital signs and the nursing notes.  Pertinent labs & imaging results that were available during my care of the patient were reviewed by me and considered in my medical decision making (see chart for details).  Iv ns.   Labs. Ct.  Reviewed nursing notes and prior charts for additional history.   Neurology consulted.  Neurology obtained ct perfusion study - neg for PE.  They are arranging eeg.  They also agree with LP as part of workup.  Iv abx given, acyclovir given.   Patient with seizure in ED, lasted approx 1 minute, gen tonic-clonic, ativan 1 mg iv. Postictal post event.   UA and cxr neg for obvious source infection.  Proceeding w lp - see note above.   Medicine service consulted for admission.   CRITICAL CARE  RE acutely altered mental status, leucocytosis, seizure, elevated lactate, r/o cva, r/o sepsis/meningitis Performed by: Mirna Mires Total critical care time: 40 minutes Critical care time was exclusive of separately billable procedures and treating other patients. Critical care was necessary to treat or prevent imminent or life-threatening deterioration. Critical care was time spent personally by me on the following activities: development of treatment plan with patient and/or surrogate as well as nursing, discussions with consultants, evaluation of patient's response to treatment, examination of patient, obtaining history from patient or surrogate, ordering and performing treatments and interventions,  ordering and review of laboratory studies, ordering and review of radiographic studies, pulse oximetry and re-evaluation of patient's condition.   Final Clinical Impressions(s) / ED Diagnoses   Final diagnoses:  None    New Prescriptions New Prescriptions   No medications on file     Lajean Saver, MD 09/18/17 1525

## 2017-09-18 NOTE — ED Notes (Signed)
Cancel code stoke. CT perfusion negative.

## 2017-09-18 NOTE — Procedures (Signed)
History: 83 year old female with new onset seizures and altered mental status  Sedation: Ativan 1.5 mg prior to EEG  Technique: This is a 21 channel routine scalp EEG performed at the bedside with bipolar and monopolar montages arranged in accordance to the international 10/20 system of electrode placement. One channel was dedicated to EKG recording.    Background: The background consists predominantly of irregular delta and theta activity.  There frontal centrally predominant low amplitude beta activity consistent with benzodiazepine effect.  She has a poorly formed posterior dominant rhythm of 6-7 Hz which is poorly sustained.  No epileptiform activity was seen  Photic stimulation: Physiologic driving is not performed  EEG Abnormalities: 1) generalized irregular slow activity 2) slow posterior dominant rhythm  Clinical Interpretation: This EEG is consistent with a generalized non-specific cerebral dysfunction(encephalopathy). There was no seizure or seizure predisposition recorded on this study. Please note that a normal EEG does not preclude the possibility of epilepsy.   Roland Rack, MD [facility] [phone]  If 7pm- 7am, please page neurology on call as listed in [place].

## 2017-09-18 NOTE — ED Notes (Addendum)
While at pt bedside she screamed out and began having full body convulsions lasting about 45 seconds. Pt was incontinent of urine, drooling, and spo2 dropped to 73% RA. Pt placed on 10lpm NRB and spo2 increased to 99%.

## 2017-09-18 NOTE — Progress Notes (Signed)
Patient is restless, pulling at gown and tele wires. MRI is coming to get patient. Haldol given per prn order, see EMAR

## 2017-09-18 NOTE — ED Notes (Signed)
Pt less responsive at this time and no longer responding when name called. Eyes appeared to be fixed towards ceiling and pt extremities have mild tremor. Dr. Ashok Cordia aware and in room to assess pt. No additional orders obtained.

## 2017-09-18 NOTE — H&P (Signed)
History and Physical    Samantha Woods OEV:[id number] DOB: 1921-02-04 DOA: 09/18/2017  PCP: Claretta Fraise, MD Patient coming from: Home  Chief Complaint: AMS  HPI: Samantha Woods is a 83 y.o. female with medical history significant of GERD, HTN, hypothryoidism.   Level 5 caveat. History provided by patient's son and daughter-in-law. Patient lives with her son. Patient was in her normal state of health until the morning of admission she was found down by her bedside. Last known normal was at approximately 20:00 the day prior to admission. Patient was found beside her bed she was confused and very weak. Over time his confusion simply got worse and EMS was called to evaluate the patient. Patient had a few balance issues with over the last 2 days prior to admission but denies any recent headache, neck stiffness, chest pain, short of breath, palpitations, vomiting, rash, fever, dysuria, frequency, flank pain.   At baseline patient is able to perform all of her ADLs, is intellectually engaged in conversation and is aware of local, national, and world events and able to carry on a normal conversation.  No recent medication changes.   ED Course: While in the emergency room patient had a witnessed seizure which lasted approximately 1 minute. Tonic-clonic in nature. Relieved with Ativan.  Review of Systems: As per HPI otherwise all other systems reviewed and are negative  Ambulatory Status:no restrictions  Past Medical History:  Diagnosis Date  . GERD (gastroesophageal reflux disease)   . Hypertension   . Thyroid disease     Past Surgical History:  Procedure Laterality Date  . ABDOMINAL HYSTERECTOMY    . GALLBLADDER SURGERY      Social History   Social History  . Marital status: Widowed    Spouse name: N/A  . Number of children: N/A  . Years of education: N/A   Occupational History  . Not on file.   Social History Main Topics  . Smoking status: Never Smoker  . Smokeless  tobacco: Never Used  . Alcohol use 0.0 oz/week     Comment: occasional  . Drug use: No  . Sexual activity: Not on file   Other Topics Concern  . Not on file   Social History Narrative  . No narrative on file    Allergies  Allergen Reactions  . Sulfa Antibiotics Shortness Of Breath  . Darvon [Propoxyphene] Hives    Family History  Problem Relation Age of Onset  . Heart disease Mother       Prior to Admission medications   Medication Sig Start Date End Date Taking? Authorizing Provider  acetaminophen (TYLENOL) 325 MG tablet Take 650 mg by mouth every 4 (four) hours as needed.    [provider]  aspirin EC 81 MG tablet Take 81 mg by mouth daily.    [provider]  Cholecalciferol (VITAMIN D) 2000 UNITS CAPS Take 1 capsule by mouth.    [provider]  docusate sodium (COLACE) 100 MG capsule Take 100 mg by mouth 2 (two) times daily.    [provider]  esomeprazole (NEXIUM) 20 MG capsule Take 1 capsule (20 mg total) by mouth daily at 12 noon. 08/30/17   Janora Norlander, DO  fluticasone (FLONASE) 50 MCG/ACT nasal spray Place 1 spray into both nostrils 2 (two) times daily as needed for allergies or rhinitis. 04/02/17   Claretta Fraise, MD  levothyroxine (SYNTHROID, LEVOTHROID) 75 MCG tablet Take 1 tablet (75 mcg total) by mouth daily before breakfast. 08/30/17  Ronnie Doss M, DO  meloxicam (MOBIC) 7.5 MG tablet Take 1 tablet (7.5 mg total) by mouth daily. As needed for arthritic pain 09/10/17   Ronnie Doss M, DO  Omega-3 Fatty Acids (FISH OIL) 1000 MG CAPS Take 1 capsule by mouth daily.    [provider]  predniSONE (DELTASONE) 10 MG tablet Take 60mg  by mouth day 1, 50mg  day 2, 40mg  day 3, 30mg  day 4, 20mg  day 5, 10mg  day 6.  Then stop. 09/10/17   Janora Norlander, DO  tizanidine (ZANAFLEX) 2 MG capsule Take 1 capsule (2 mg total) by mouth 3 (three) times daily as needed for muscle spasms. 09/10/17   Janora Norlander, DO      Physical Exam: Vitals:   09/18/17 1530 09/18/17 1630 09/18/17 1717 09/18/17 1730  BP:  (!) 167/96 (!) 126/91 (!) 129/99  Pulse: 98 (!) 113 (!) 101 85  Resp: (!) 25 (!) 21 20 20   Temp:      TempSrc:      SpO2: 100% (!) 86% 100% 94%  Weight:         General: appears to be in distress lying in bed Eyes: normal lids, ENT: nml lips & tongue, mmm Neck:  no LAD, masses or thyromegaly Cardiovascular:  RRR, 2+ pulses  Respiratory: nml effort. No wheezing Abdomen:  soft, ntnd,  Skin: scattered ecchymosis.  no rash or induration seen on limited exam Musculoskeletal:  grossly normal tone BUE, good ROM, no bony abnormality Psychiatric: Confused and somewhat agitated. Writhing in bed. Alert and oriented 0. Neurologic:  CN 2-12 grossly intact, moves all extremities in coordinated fashion,    Labs on Admission: I have personally reviewed following labs and imaging studies  CBC:  Recent Labs Lab 09/18/17 1221  WBC 22.8*  HGB 15.5*  HCT 46.5*  MCV 91.9  PLT 527   Basic Metabolic Panel:  Recent Labs Lab 09/18/17 1221  NA 140  K 3.4*  CL 101  CO2 29  GLUCOSE 166*  BUN 15  CREATININE 0.98  CALCIUM 9.0   GFR: Estimated Creatinine Clearance: 29.9 mL/min (by C-G formula based on SCr of 0.98 mg/dL). Liver Function Tests:  Recent Labs Lab 09/18/17 1221  AST 35  ALT 47  ALKPHOS 86  BILITOT 1.2  PROT 6.6  ALBUMIN 3.5   No results for input(s): LIPASE, AMYLASE in the last 168 hours. No results for input(s): AMMONIA in the last 168 hours. Coagulation Profile: No results for input(s): INR, PROTIME in the last 168 hours. Cardiac Enzymes:  Recent Labs Lab 09/18/17 1405  CKTOTAL 219   BNP (last 3 results) No results for input(s): PROBNP in the last 8760 hours. HbA1C: No results for input(s): HGBA1C in the last 72 hours. CBG:  Recent Labs Lab 09/18/17 1225  GLUCAP 150*   Lipid Profile: No results for input(s): CHOL, HDL, LDLCALC, TRIG, CHOLHDL, LDLDIRECT  in the last 72 hours. Thyroid Function Tests: No results for input(s): TSH, T4TOTAL, FREET4, T3FREE, THYROIDAB in the last 72 hours. Anemia Panel: No results for input(s): VITAMINB12, FOLATE, FERRITIN, TIBC, IRON, RETICCTPCT in the last 72 hours. Urine analysis:    Component Value Date/Time   COLORURINE STRAW (A) 09/18/2017 1358   APPEARANCEUR CLEAR 09/18/2017 1358   LABSPEC 1.017 09/18/2017 1358   PHURINE 8.0 09/18/2017 1358   GLUCOSEU 50 (A) 09/18/2017 1358   HGBUR SMALL (A) 09/18/2017 1358   BILIRUBINUR NEGATIVE 09/18/2017 1358   KETONESUR NEGATIVE 09/18/2017 1358   PROTEINUR NEGATIVE 09/18/2017 1358  NITRITE NEGATIVE 09/18/2017 [place] 09/18/2017 1358    Creatinine Clearance: Estimated Creatinine Clearance: 29.9 mL/min (by C-G formula based on SCr of 0.98 mg/dL).  Sepsis Labs: @LABRCNTIP (procalcitonin:4,lacticidven:4) ) Recent Results (from the past 240 hour(s))  CSF culture     Status: None (Preliminary result)   Collection Time: 09/18/17  2:35 PM  Result Value Ref Range Status   Specimen Description CSF  Final   Special Requests NONE  Final   Gram Stain   Final    WBC PRESENT, PREDOMINANTLY MONONUCLEAR NO ORGANISMS SEEN CYTOSPIN SMEAR    Culture PENDING  Incomplete   Report Status PENDING  Incomplete     Radiological Exams on Admission: Ct Angio Head W Or Wo Contrast  Result Date: 09/18/2017 CLINICAL DATA:  Code stroke.  Found on the floor.  Disoriented. EXAM: CT ANGIOGRAPHY HEAD AND NECK CT PERFUSION BRAIN TECHNIQUE: Multidetector CT imaging of the head and neck was performed using the standard protocol during bolus administration of intravenous contrast. Multiplanar CT image reconstructions and MIPs were obtained to evaluate the vascular anatomy. Carotid stenosis measurements (when applicable) are obtained utilizing NASCET criteria, using the distal internal carotid diameter as the denominator. Multiphase CT imaging of the brain was  performed following IV bolus contrast injection. Subsequent parametric perfusion maps were calculated using RAPID software. CONTRAST:  50 cc Isovue 350 COMPARISON:  CT same day FINDINGS: CTA NECK FINDINGS Aortic arch: Atherosclerosis of the arch. No aneurysm or dissection. Branching pattern of the brachiocephalic vessels is normal without origin stenosis. Right carotid system: Common carotid artery is tortuous but widely patent to the bifurcation region. Carotid bifurcation shows atherosclerotic plaque but no stenosis or irregularity. Cervical ICA is tortuous but widely patent. Left carotid system: Common carotid artery widely patent to the bifurcation region. The vessel is tortuous. Atherosclerotic plaque at the carotid bifurcation and ICA bulb but no narrowing of the ICA beyond the diameter of the more distal cervical ICA and therefore no stenosis. Vertebral arteries: Both vertebral artery origins are widely patent. Both vertebral arteries are widely patent through the cervical region to the foramen magnum. Skeleton: Ordinary cervical spondylosis. Other neck: No mass or lymphadenopathy. Upper chest: Chronic appearing pulmonary scarring. No active process identified. Review of the MIP images confirms the above findings CTA HEAD FINDINGS Anterior circulation: Both internal carotid arteries are patent through the skullbase and siphon regions. No siphon stenosis. The anterior and middle cerebral vessels are normal without proximal stenosis, aneurysm or vascular malformation. Posterior circulation: Both vertebral arteries are patent through the foramen magnum. Mild fusiform dilatation of the left vertebral artery at the dural penetration without focal aneurysm. Both vertebral arteries widely patent to the basilar. No basilar stenosis. Posterior circulation branch vessels are normal. Venous sinuses: Patent and normal. Anatomic variants: None significant Delayed phase: No abnormal enhancement Review of the MIP images  confirms the above findings CT Brain Perfusion Findings: CBF (<30%) Volume: 49mL Perfusion (Tmax>6.0s) volume: 68mL Mismatch Volume: 78mL Infarction Location:None IMPRESSION: Normal perfusion exam. CT angiography shows ordinary mild atherosclerosis of the aortic arch in both carotid bifurcations but no stenosis or irregularity. No intracranial abnormal vascular finding. These results were called by telephone at the time of interpretation on 09/18/2017 at 1:35 pm to Dr. Roland Rack , who verbally acknowledged these results. Electronically Signed   By: Nelson Chimes M.D.   On: 09/18/2017 13:37   Ct Angio Neck W Or Wo Contrast  Result Date: 09/18/2017 CLINICAL DATA:  Code stroke.  Found  on the floor.  Disoriented. EXAM: CT ANGIOGRAPHY HEAD AND NECK CT PERFUSION BRAIN TECHNIQUE: Multidetector CT imaging of the head and neck was performed using the standard protocol during bolus administration of intravenous contrast. Multiplanar CT image reconstructions and MIPs were obtained to evaluate the vascular anatomy. Carotid stenosis measurements (when applicable) are obtained utilizing NASCET criteria, using the distal internal carotid diameter as the denominator. Multiphase CT imaging of the brain was performed following IV bolus contrast injection. Subsequent parametric perfusion maps were calculated using RAPID software. CONTRAST:  50 cc Isovue 350 COMPARISON:  CT same day FINDINGS: CTA NECK FINDINGS Aortic arch: Atherosclerosis of the arch. No aneurysm or dissection. Branching pattern of the brachiocephalic vessels is normal without origin stenosis. Right carotid system: Common carotid artery is tortuous but widely patent to the bifurcation region. Carotid bifurcation shows atherosclerotic plaque but no stenosis or irregularity. Cervical ICA is tortuous but widely patent. Left carotid system: Common carotid artery widely patent to the bifurcation region. The vessel is tortuous. Atherosclerotic plaque at the  carotid bifurcation and ICA bulb but no narrowing of the ICA beyond the diameter of the more distal cervical ICA and therefore no stenosis. Vertebral arteries: Both vertebral artery origins are widely patent. Both vertebral arteries are widely patent through the cervical region to the foramen magnum. Skeleton: Ordinary cervical spondylosis. Other neck: No mass or lymphadenopathy. Upper chest: Chronic appearing pulmonary scarring. No active process identified. Review of the MIP images confirms the above findings CTA HEAD FINDINGS Anterior circulation: Both internal carotid arteries are patent through the skullbase and siphon regions. No siphon stenosis. The anterior and middle cerebral vessels are normal without proximal stenosis, aneurysm or vascular malformation. Posterior circulation: Both vertebral arteries are patent through the foramen magnum. Mild fusiform dilatation of the left vertebral artery at the dural penetration without focal aneurysm. Both vertebral arteries widely patent to the basilar. No basilar stenosis. Posterior circulation branch vessels are normal. Venous sinuses: Patent and normal. Anatomic variants: None significant Delayed phase: No abnormal enhancement Review of the MIP images confirms the above findings CT Brain Perfusion Findings: CBF (<30%) Volume: 62mL Perfusion (Tmax>6.0s) volume: 15mL Mismatch Volume: 2mL Infarction Location:None IMPRESSION: Normal perfusion exam. CT angiography shows ordinary mild atherosclerosis of the aortic arch in both carotid bifurcations but no stenosis or irregularity. No intracranial abnormal vascular finding. These results were called by telephone at the time of interpretation on 09/18/2017 at 1:35 pm to Dr. Roland Rack , who verbally acknowledged these results. Electronically Signed   By: Nelson Chimes M.D.   On: 09/18/2017 13:37   Ct Cervical Spine Wo Contrast  Result Date: 09/18/2017 CLINICAL DATA:  83 year old female with acute neck pain  following fall. Initial encounter. EXAM: CT CERVICAL SPINE WITHOUT CONTRAST TECHNIQUE: Multidetector CT imaging of the cervical spine was performed without intravenous contrast. Multiplanar CT image reconstructions were also generated. COMPARISON:  None. FINDINGS: Alignment: Normal. Skull base and vertebrae: No acute fracture. No primary bone lesion or focal pathologic process. Soft tissues and spinal canal: No prevertebral fluid or swelling. No visible canal hematoma. Disc levels: Moderate multilevel degenerative disc disease, spondylosis and facet arthropathy noted contributing to mild central and bony foraminal narrowing at multiple levels. Upper chest: No acute abnormality Other: None. IMPRESSION: 1. No evidence that no static evidence of acute injury to the cervical spine 2. Moderate multilevel degenerative changes. Electronically Signed   By: Margarette Canada M.D.   On: 09/18/2017 13:25   Ct Cerebral Perfusion W Contrast  Result Date:  09/18/2017 CLINICAL DATA:  Code stroke.  Found on the floor.  Disoriented. EXAM: CT ANGIOGRAPHY HEAD AND NECK CT PERFUSION BRAIN TECHNIQUE: Multidetector CT imaging of the head and neck was performed using the standard protocol during bolus administration of intravenous contrast. Multiplanar CT image reconstructions and MIPs were obtained to evaluate the vascular anatomy. Carotid stenosis measurements (when applicable) are obtained utilizing NASCET criteria, using the distal internal carotid diameter as the denominator. Multiphase CT imaging of the brain was performed following IV bolus contrast injection. Subsequent parametric perfusion maps were calculated using RAPID software. CONTRAST:  50 cc Isovue 350 COMPARISON:  CT same day FINDINGS: CTA NECK FINDINGS Aortic arch: Atherosclerosis of the arch. No aneurysm or dissection. Branching pattern of the brachiocephalic vessels is normal without origin stenosis. Right carotid system: Common carotid artery is tortuous but widely  patent to the bifurcation region. Carotid bifurcation shows atherosclerotic plaque but no stenosis or irregularity. Cervical ICA is tortuous but widely patent. Left carotid system: Common carotid artery widely patent to the bifurcation region. The vessel is tortuous. Atherosclerotic plaque at the carotid bifurcation and ICA bulb but no narrowing of the ICA beyond the diameter of the more distal cervical ICA and therefore no stenosis. Vertebral arteries: Both vertebral artery origins are widely patent. Both vertebral arteries are widely patent through the cervical region to the foramen magnum. Skeleton: Ordinary cervical spondylosis. Other neck: No mass or lymphadenopathy. Upper chest: Chronic appearing pulmonary scarring. No active process identified. Review of the MIP images confirms the above findings CTA HEAD FINDINGS Anterior circulation: Both internal carotid arteries are patent through the skullbase and siphon regions. No siphon stenosis. The anterior and middle cerebral vessels are normal without proximal stenosis, aneurysm or vascular malformation. Posterior circulation: Both vertebral arteries are patent through the foramen magnum. Mild fusiform dilatation of the left vertebral artery at the dural penetration without focal aneurysm. Both vertebral arteries widely patent to the basilar. No basilar stenosis. Posterior circulation branch vessels are normal. Venous sinuses: Patent and normal. Anatomic variants: None significant Delayed phase: No abnormal enhancement Review of the MIP images confirms the above findings CT Brain Perfusion Findings: CBF (<30%) Volume: 79mL Perfusion (Tmax>6.0s) volume: 78mL Mismatch Volume: 79mL Infarction Location:None IMPRESSION: Normal perfusion exam. CT angiography shows ordinary mild atherosclerosis of the aortic arch in both carotid bifurcations but no stenosis or irregularity. No intracranial abnormal vascular finding. These results were called by telephone at the time of  interpretation on 09/18/2017 at 1:35 pm to Dr. Roland Rack , who verbally acknowledged these results. Electronically Signed   By: Nelson Chimes M.D.   On: 09/18/2017 13:37   Dg Chest Port 1 View  Result Date: 09/18/2017 CLINICAL DATA:  Altered mental status EXAM: PORTABLE CHEST 1 VIEW COMPARISON:  None. FINDINGS: Heart is borderline enlarged. Mild vascular congestion. Left basilar opacity likely reflects atelectasis. No effusions or acute bony abnormality. IMPRESSION: Borderline cardiomegaly, vascular congestion. Left base atelectasis. Electronically Signed   By: Rolm Baptise M.D.   On: 09/18/2017 13:54   Ct Head Code Stroke Wo Contrast  Result Date: 09/18/2017 CLINICAL DATA:  Code stroke. Found on the floor this morning. Confusion. EXAM: CT HEAD WITHOUT CONTRAST TECHNIQUE: Contiguous axial images were obtained from the base of the skull through the vertex without intravenous contrast. COMPARISON:  None. FINDINGS: Brain: Generalized atrophy. Chronic small-vessel ischemic changes of the white matter. No sign of acute infarction, mass lesion, hemorrhage, hydrocephalus or extra-axial collection. Vascular: There is atherosclerotic calcification of the major vessels at  the base of the brain. Skull: Negative Sinuses/Orbits: Clear/normal Other: None ASPECTS ([place] Stroke Program Early CT Score) - Ganglionic level infarction (caudate, lentiform nuclei, internal capsule, insula, M1-M3 cortex): 7 - Supraganglionic infarction (M4-M6 cortex): 3 Total score (0-10 with 10 being normal): 10 IMPRESSION: 1. Atrophy and chronic small-vessel ischemic changes. No acute or subacute finding. 2. ASPECTS is 10. These results were called by telephone at the time of interpretation on 09/18/2017 at 1:10 pm to Dr. Leonel Ramsay, who verbally acknowledged these results. Electronically Signed   By: Nelson Chimes M.D.   On: 09/18/2017 13:13    EKG: Independently reviewed. PVC, sinus, no ACS  Assessment/Plan Active  Problems:   Seizure [facility])   Acute encephalopathy   Hypertensive emergency   Musculoskeletal pain   GERD (gastroesophageal reflux disease)   AMS: secondary to Seizure of unknown etiology. Doubt medication related and no significant systemic metabolic derangements noted. Concern for infectious process. LP performed by EDP. Started on Acyclovir, Ampicillin and Rocephin and Vanc in ED.   - STAT EEG- - f/u LP results  - continue Acyclovir, Rocephin, Vanc - f/u Neuro recs  - Keppra per Neuro - Ativan PRN - TSH, Lactic acid, CK - If in status pt will need close monitoring and administration of medications by Neuro and possibly ICU  - Pt is DNR - MRI when condition stabilizes   HTN emergency: BP elevated (questionable reads recorded w/ improvement after cuff repositioning). Possible PRES - Hydralazine PRN  MSK pain: chronic and at baseline. Recently concluded a prednisone taper pack w/ slow return of pains as steroid dose decreased. Taking Meloxicam for 4 wks w/ some improvement - PT - Tylenol - consider NSAIDs but sparingly due to risk factors - Robaxin IV prn  Hypothyroidism: - continue Levothyroxine  GERD: - continue PPI   DVT prophylaxis: SCD  Code Status: DNR  Family Communication: dhildren and grandchildren   Disposition Plan: SDU - pending return of labs and improvement in condition Consults called: Neuro  Admission status: inpt    Waldemar Dickens MD [facility]  If 7PM-7AM, please contact night-coverage [email or web address] Password [facility]  09/18/2017, 5:44 PM     Critical Care Time This patient is critically ill requiring frequent monitoring and support due to their life/organ threatening condition. This care involves a high complexity of medical decision making. Greater than 70 minutes spent in direct patient care and coordination. Discussed case w/ EDP and Neuro on several occasions.

## 2017-09-18 NOTE — Progress Notes (Signed)
EEG completed, results pending. 

## 2017-09-18 NOTE — ED Notes (Signed)
Transported to CT 2 

## 2017-09-18 NOTE — Progress Notes (Addendum)
Pharmacy Antibiotic Note  Samantha Woods is a 83 y.o. female admitted on 09/18/2017 with r/o meningitis / sepsis.  Pharmacy has been consulted for Vancomycin, ceftriaxone and ampicillin dosing. WBC elevated at 22.8. SCr 0.98  Plan: -Ceftriaxone 2 gm IV Q 12 hours -Vancomycin 1250 mg IV once, then vancomycin 1 gm IV Q 24 hours -Ampicillin 2 gm IV Q 6 hours -Monitor CBC, renal fx. Cultures and clinical progress -VT at [facility]   Weight: 154 lb 1.6 oz (69.9 kg)  Temp (24hrs), Avg:97.9 F (36.6 C), Min:97.9 F (36.6 C), Max:97.9 F (36.6 C)   Recent Labs Lab 09/18/17 1221  WBC 22.8*  CREATININE 0.98    Estimated Creatinine Clearance: 29.9 mL/min (by C-G formula based on SCr of 0.98 mg/dL).    Allergies  Allergen Reactions  . Sulfa Antibiotics Shortness Of Breath  . Darvon [Propoxyphene] Hives    Antimicrobials this admission: Vanc 10/24 >>  Ceftriaxone 10/24 >>  Ampicillin 10/24 >>   Dose adjustments this admission: None   Microbiology results:  Thank you for allowing pharmacy to be a part of this patient's care.  Albertina Parr, PharmD., BCPS Clinical Pharmacist Pager [phone]  Addendum: Now adding acyclovir to regimen to cover HSV encephalitis. Will start acyclovir 10 mg/kg (~450 mg) of ideal body weight Q 8 hours.   Albertina Parr, PharmD., BCPS Clinical Pharmacist Pager [phone]

## 2017-09-19 ENCOUNTER — Inpatient Hospital Stay (HOSPITAL_COMMUNITY): Payer: PPO

## 2017-09-19 LAB — CBC
HEMATOCRIT: 41.7 % (ref 36.0–46.0)
HEMOGLOBIN: 13.5 g/dL (ref 12.0–15.0)
MCH: 29.9 pg (ref 26.0–34.0)
MCHC: 32.4 g/dL (ref 30.0–36.0)
MCV: 92.3 fL (ref 78.0–100.0)
Platelets: 380 10*3/uL (ref 150–400)
RBC: 4.52 MIL/uL (ref 3.87–5.11)
RDW: 15.3 % (ref 11.5–15.5)
WBC: 24.4 10*3/uL — ABNORMAL HIGH (ref 4.0–10.5)

## 2017-09-19 LAB — COMPREHENSIVE METABOLIC PANEL
ALK PHOS: 71 U/L (ref 38–126)
ALT: 47 U/L (ref 14–54)
ANION GAP: 12 (ref 5–15)
AST: 53 U/L — ABNORMAL HIGH (ref 15–41)
Albumin: 3.3 g/dL — ABNORMAL LOW (ref 3.5–5.0)
BUN: 14 mg/dL (ref 6–20)
CO2: 26 mmol/L (ref 22–32)
Calcium: 8.3 mg/dL — ABNORMAL LOW (ref 8.9–10.3)
Chloride: 101 mmol/L (ref 101–111)
Creatinine, Ser: 1 mg/dL (ref 0.44–1.00)
GFR calc non Af Amer: 47 mL/min — ABNORMAL LOW (ref >60)
GFR, EST AFRICAN AMERICAN: 54 mL/min — AB (ref >60)
Glucose, Bld: 141 mg/dL — ABNORMAL HIGH (ref 65–99)
POTASSIUM: 3.3 mmol/L — AB (ref 3.5–5.1)
SODIUM: 139 mmol/L (ref 135–145)
Total Bilirubin: 1.6 mg/dL — ABNORMAL HIGH (ref 0.3–1.2)
Total Protein: 5.9 g/dL — ABNORMAL LOW (ref 6.5–8.1)

## 2017-09-19 LAB — MRSA PCR SCREENING: MRSA BY PCR: POSITIVE — AB

## 2017-09-19 LAB — CK: CK TOTAL: 525 U/L — AB (ref 38–234)

## 2017-09-19 MED ORDER — SODIUM CHLORIDE 0.9 % IV SOLN
2.0000 g | Freq: Three times a day (TID) | INTRAVENOUS | Status: DC
  Administered 2017-09-19: 2 g via INTRAVENOUS
  Filled 2017-09-19 (×2): qty 2000

## 2017-09-19 MED ORDER — CHLORHEXIDINE GLUCONATE 0.12 % MT SOLN
15.0000 mL | Freq: Two times a day (BID) | OROMUCOSAL | Status: DC
  Administered 2017-09-19 – 2017-09-24 (×10): 15 mL via OROMUCOSAL
  Filled 2017-09-19 (×11): qty 15

## 2017-09-19 MED ORDER — DEXTROSE 5 % IV SOLN
530.0000 mg | INTRAVENOUS | Status: DC
  Filled 2017-09-19: qty 10.6

## 2017-09-19 MED ORDER — ORAL CARE MOUTH RINSE
15.0000 mL | Freq: Two times a day (BID) | OROMUCOSAL | Status: DC
  Administered 2017-09-19 – 2017-09-24 (×6): 15 mL via OROMUCOSAL

## 2017-09-19 MED ORDER — ACETAMINOPHEN 500 MG PO TABS
1000.0000 mg | ORAL_TABLET | Freq: Four times a day (QID) | ORAL | Status: DC | PRN
  Administered 2017-09-20 – 2017-09-24 (×9): 1000 mg via ORAL
  Filled 2017-09-19 (×9): qty 2

## 2017-09-19 MED ORDER — ACETAMINOPHEN 650 MG RE SUPP
975.0000 mg | Freq: Four times a day (QID) | RECTAL | Status: DC | PRN
  Administered 2017-09-19 – 2017-09-20 (×2): 975 mg via RECTAL
  Filled 2017-09-19 (×2): qty 2

## 2017-09-19 MED ORDER — MUPIROCIN 2 % EX OINT
TOPICAL_OINTMENT | Freq: Two times a day (BID) | CUTANEOUS | Status: DC
  Administered 2017-09-19 – 2017-09-24 (×10): via NASAL
  Filled 2017-09-19 (×3): qty 22

## 2017-09-19 MED ORDER — DEXTROSE 5 % IV SOLN: 650.0000 mg | INTRAVENOUS | Status: DC

## 2017-09-19 MED ORDER — DEXTROSE 5 % IV SOLN
1.0000 g | INTRAVENOUS | Status: DC
  Administered 2017-09-20 – 2017-09-21 (×2): 1 g via INTRAVENOUS
  Filled 2017-09-19 (×2): qty 10

## 2017-09-19 MED ORDER — DEXTROSE 5 % IV SOLN: 1.0000 g | Freq: Two times a day (BID) | INTRAVENOUS | Status: DC

## 2017-09-19 MED ORDER — ACETAMINOPHEN 80 MG RE SUPP
80.0000 mg | Freq: Four times a day (QID) | RECTAL | Status: DC | PRN
  Filled 2017-09-19: qty 1

## 2017-09-19 NOTE — Progress Notes (Signed)
PT Cancellation Note  Patient Details Name: Samantha Woods MRN: [id number] DOB: 24-Jan-1921   Cancelled Treatment:    Reason Eval/Treat Not Completed: Other (comment) (Pt agitated and nurse asked PT to HOLD.  Will check back tomorrow. )   Denice Paradise 09/19/2017, 12:07 PM Jaelen Gellerman [facility] [phone] (pager)

## 2017-09-19 NOTE — Progress Notes (Signed)
Swallow eval at the bedside: Patient alert and orientated X2 Able to cough and control secreations HOB 45% for swall eval Patient tolerated small spoon of water Did NOT tolerate sip of water from cup- coughing

## 2017-09-19 NOTE — Progress Notes (Signed)
Subjective: Much improved over night  Exam: Vitals:   09/19/17 1135 09/19/17 1643  BP: (!) 110/55 (!) 153/86  Pulse:    Resp: 18 13  Temp: (!) 97.5 F (36.4 C) 98 F (36.7 C)  SpO2:     Gen: In bed, NAD Resp: non-labored breathing, no acute distress Abd: soft, nt  Neuro: MS: Awakens easily to voice, she engages with me and answers questions, though she is not oriented. She does follow commands. CN: She is able to count fingers in both hemifields, face is symmetric Motor: She moves all external asymmetrically, does not give good effort in either leg Sensory: Endorses symmetric sensation  Pertinent Labs: CSF without evidence of infection  Impression: 83 year old female presented with severe altered mental status of unclear etiology. She did have a seizure shortly after arrival, I would favor continuing antiepileptics. Septic encephalopathy would be one consideration, though no clear source has been found. I initially considered meningitis/encephalitis, though with her improvement and CSF, I think that this is extremely unlikely.  Recommendations: 1) continue Keppra 500 mg twice a day 2) I will continue to follow  Roland Rack, MD [facility] [phone]  If 7pm- 7am, please page neurology on call as listed in [place].

## 2017-09-19 NOTE — Progress Notes (Signed)
CRITICAL VALUE ALERT  Critical Value:  Pos mrsa swab  Date & Time Notied:  09/19/17 0245  Provider Notified: Dr Hal Hope text paged  Orders Received/Actions taken: awaiting orders

## 2017-09-19 NOTE — Progress Notes (Signed)
Patient is restless and pulling at R shoulder. Grand daughter is at the bedside. Noted small bruise on R shoulder. Patient is repositioned and ice pack is added to the R shoudler. Patient grand daughter requesting pain med. Notified Dr Hal Hope. Orders received for Tylenol supp. Med given, see EMAR>

## 2017-09-19 NOTE — Progress Notes (Signed)
PROGRESS NOTE    Samantha Woods  SWF:[id number] DOB: 1920/12/21 DOA: 09/18/2017 PCP: Janora Norlander, DO   Specialists:     Brief Narrative:   83 year old female Known history of cystocele with pelvic prolapse Reflux Osteoarthritis Hypertension Hypothyroidism Prediabetes  Admitted from home on 10/24 after being found down with what was presumed to be an accidental fall Also found to have metabolic encephalopathy of unclear cause-LP done did not confirm bacterial meningitis  Neurology was consulted as patient had seizure-like activity in the emergency room-given 1.5 mg of Ativan EEG was performed which was nonspecific Lactic acidosis2.3 on admission pro-calcitonin 0.1, BUN/creatinine 15/0.9 around same usual baseline LFTs normal point-of-care troponin norma WBC 22 platelets 389 Blood cultures and urine culture performed are pending   Assessment & Plan:   Active Problems:   Seizure ([place])   Acute encephalopathy   Hypertensive emergency   Musculoskeletal pain   GERD (gastroesophageal reflux disease)   Toxic metabolic encephalopathy probably of infectious etiology--?Klebsiella PNa Pyelo  D/w dr Leonel Ramsay stopping Acyclovir 450 every 8, ampicillin meningitis dosing 2 g every 6, vancomycin 1 g every  24--very low concern for this being meningitis given Neg LP  Blood culture neg, urine culture -K PNA --Change ceftriaxone to Pyelo coverage  As differential is broad will await TSH B12  Appreciate neurology input  Obtain MRI when patient able to cooperate  ?  Seizure  EEG was performed-loaded with Keppra 1 g, continue 500 every 12  No antecedent history of seizure disorder, and was uinwitnessed   Reflux  Holding omeprazole for now, can give pantoprazole 40 daily if able to take  Hypothyroidism not otherwise specified  Continue Synthroid 75 if able to take p.o. otherwise half the dose 37.5 and follow TSH  Hypertension  Holding Aldactazide 25-25  Can cover with  hydralazine 5-10 mg every 4 as needed blood pressure greater than 180    DVT prophylaxis:  Code Status:  Family Communication:  Disposition Plan:    Consultants:   Neurology  Procedures:   EEG  Antimicrobials:   Acyclovir, ceftriaxone, ampicillin, vancomycin 10/24   Subjective:  More awake alert and back from MRI Hungry Not completely back to nl but seem improved  Objective: Vitals:   09/18/17 2306 09/19/17 0318 09/19/17 0400 09/19/17 0500  BP: (!) 126/96 127/65    Pulse:      Resp: (!) 28 17 17  (!) 32  Temp: 98.5 F (36.9 C) 99.5 F (37.5 C)    TempSrc: Axillary Axillary    SpO2: 100% 100% 100% 95%  Weight:      Height:        Intake/Output Summary (Last 24 hours) at 09/19/17 0720 Last data filed at 09/19/17 2202  Gross per 24 hour  Intake          2388.67 ml  Output                0 ml  Net          2388.67 ml   Filed Weights   09/18/17 1237 09/18/17 1309 09/18/17 2157  Weight: 67.1 kg (148 lb) 69.9 kg (154 lb 1.6 oz) 66.6 kg (146 lb 13.2 oz)    Examination:  Frail bitemporalis wasting cta b s1 s2 no m/r/g abd slight tender lower quadrant No rebound no gaurding Le soft nt not swollen lifts both legs off of bed, reflexes 2/3  Data Reviewed: I have personally reviewed following labs and imaging studies  CBC:  Recent Labs Lab  09/18/17 1221 09/19/17 0226  WBC 22.8* 24.4*  HGB 15.5* 13.5  HCT 46.5* 41.7  MCV 91.9 92.3  PLT 389 330   Basic Metabolic Panel:  Recent Labs Lab 09/18/17 1221 09/19/17 0226  NA 140 139  K 3.4* 3.3*  CL 101 101  CO2 29 26  GLUCOSE 166* 141*  BUN 15 14  CREATININE 0.98 1.00  CALCIUM 9.0 8.3*   GFR: Estimated Creatinine Clearance: 28.6 mL/min (by C-G formula based on SCr of 1 mg/dL). Liver Function Tests:  Recent Labs Lab 09/18/17 1221 09/19/17 0226  AST 35 53*  ALT 47 47  ALKPHOS 86 71  BILITOT 1.2 1.6*  PROT 6.6 5.9*  ALBUMIN 3.5 3.3*   No results for input(s): LIPASE, AMYLASE in the  last 168 hours.  Recent Labs Lab 09/18/17 1941  AMMONIA 30   Coagulation Profile: No results for input(s): INR, PROTIME in the last 168 hours. Cardiac Enzymes:  Recent Labs Lab 09/18/17 1405 09/19/17 0226  CKTOTAL 219 525*   BNP (last 3 results) No results for input(s): PROBNP in the last 8760 hours. HbA1C: No results for input(s): HGBA1C in the last 72 hours. CBG:  Recent Labs Lab 09/18/17 1225  GLUCAP 150*   Lipid Profile: No results for input(s): CHOL, HDL, LDLCALC, TRIG, CHOLHDL, LDLDIRECT in the last 72 hours. Thyroid Function Tests: No results for input(s): TSH, T4TOTAL, FREET4, T3FREE, THYROIDAB in the last 72 hours. Anemia Panel: No results for input(s): VITAMINB12, FOLATE, FERRITIN, TIBC, IRON, RETICCTPCT in the last 72 hours. Urine analysis:    Component Value Date/Time   COLORURINE STRAW (A) 09/18/2017 1358   APPEARANCEUR CLEAR 09/18/2017 1358   LABSPEC 1.017 09/18/2017 1358   PHURINE 8.0 09/18/2017 1358   GLUCOSEU 50 (A) 09/18/2017 1358   HGBUR SMALL (A) 09/18/2017 1358   BILIRUBINUR NEGATIVE 09/18/2017 1358   KETONESUR NEGATIVE 09/18/2017 1358   PROTEINUR NEGATIVE 09/18/2017 1358   NITRITE NEGATIVE 09/18/2017 [place] 09/18/2017 1358     Radiology Studies: Reviewed images personally in health database    Scheduled Meds: . aspirin EC  81 mg Oral Daily  . chlorhexidine  15 mL Mouth Rinse BID  . levothyroxine  75 mcg Oral QAC breakfast  . mouth rinse  15 mL Mouth Rinse q12n4p  . pantoprazole  40 mg Oral Daily   Continuous Infusions: . sodium chloride 50 mL/hr at 09/19/17 0652  . sodium chloride Stopped (09/18/17 1744)  . sodium chloride 75 mL/hr (09/18/17 1715)  . acyclovir Stopped (09/18/17 2249)  . ampicillin (OMNIPEN) IV Stopped (09/19/17 0325)  . cefTRIAXone (ROCEPHIN)  IV Stopped (09/19/17 0335)  . levETIRAcetam Stopped (09/19/17 0316)  . methocarbamol (ROBAXIN)  IV    . vancomycin       LOS: 1 day    Time  spent: [place], MD [facility]   If 7PM-7AM, please contact night-coverage [email or web address] Password [email or web address] 09/19/2017, 7:20 AM

## 2017-09-20 ENCOUNTER — Telehealth: Payer: Self-pay | Admitting: Family Medicine

## 2017-09-20 DIAGNOSIS — R4182 Altered mental status, unspecified: Secondary | ICD-10-CM

## 2017-09-20 LAB — URINE CULTURE

## 2017-09-20 LAB — CBC WITH DIFFERENTIAL/PLATELET
BASOS ABS: 0 10*3/uL (ref 0.0–0.1)
BASOS PCT: 0 %
EOS ABS: 0.3 10*3/uL (ref 0.0–0.7)
Eosinophils Relative: 2 %
HCT: 38 % (ref 36.0–46.0)
Hemoglobin: 12.4 g/dL (ref 12.0–15.0)
Lymphocytes Relative: 12 %
Lymphs Abs: 1.8 10*3/uL (ref 0.7–4.0)
MCH: 30.2 pg (ref 26.0–34.0)
MCHC: 32.6 g/dL (ref 30.0–36.0)
MCV: 92.7 fL (ref 78.0–100.0)
MONO ABS: 1.8 10*3/uL — AB (ref 0.1–1.0)
MONOS PCT: 12 %
Neutro Abs: 11.1 10*3/uL — ABNORMAL HIGH (ref 1.7–7.7)
Neutrophils Relative %: 74 %
Platelets: 296 10*3/uL (ref 150–400)
RBC: 4.1 MIL/uL (ref 3.87–5.11)
RDW: 15.4 % (ref 11.5–15.5)
WBC: 15 10*3/uL — ABNORMAL HIGH (ref 4.0–10.5)

## 2017-09-20 LAB — HSV CULTURE AND TYPING

## 2017-09-20 LAB — HERPES SIMPLEX VIRUS(HSV) DNA BY PCR
HSV 1 DNA: NEGATIVE
HSV 2 DNA: NEGATIVE

## 2017-09-20 LAB — VITAMIN B12: VITAMIN B 12: 273 pg/mL (ref 180–914)

## 2017-09-20 LAB — TSH: TSH: 2.282 u[IU]/mL (ref 0.350–4.500)

## 2017-09-20 MED ORDER — LEVETIRACETAM 500 MG PO TABS
500.0000 mg | ORAL_TABLET | Freq: Two times a day (BID) | ORAL | Status: DC
  Administered 2017-09-20 – 2017-09-24 (×9): 500 mg via ORAL
  Filled 2017-09-20 (×9): qty 1

## 2017-09-20 MED ORDER — WHITE PETROLATUM EX OINT
TOPICAL_OINTMENT | CUTANEOUS | Status: AC
  Administered 2017-09-20: 22:00:00
  Filled 2017-09-20: qty 28.35

## 2017-09-20 NOTE — Progress Notes (Signed)
Assumed care from [facility], helped pt back to bed from chair. Pt complaining of right shoulder pain where she has bruising. Tylenol given. Son, [place], left for dinner.

## 2017-09-20 NOTE — Progress Notes (Signed)
Subjective: Still improving.   Exam: Vitals:   09/20/17 0321 09/20/17 0839  BP: (!) 162/85 140/66  Pulse: 94 89  Resp: (!) 25 20  Temp: 98.6 F (37 C) 97.6 F (36.4 C)  SpO2: 94% 95%   Gen: In bed, NAD Resp: non-labored breathing, no acute distress Abd: soft, nt  Neuro: MS: Awakens easily to voice, she engages with me and answers questions, gives year as 2007, knows she is in the hospital.. She does follow commands. CN: She is able to count fingers in both hemifields, face is symmetric Motor: She continues to give poor effort in the legs proximally, though does slightly better with the right. Sensory: Endorses symmetric sensation  Pertinent Labs: CSF without evidence of infection  Impression: 83 year old female presented with severe altered mental status of unclear etiology. She did have a seizure shortly after arrival, I would favor continuing antiepileptics. Septic encephalopathy would be one consideration, though no clear source has been found. I initially considered meningitis/encephalitis, though with her improvement and CSF, I think that this is extremely unlikely.  I suspect that she had a seizure secondary to infection(Urinary). At 60, I think that a seizure predisposition is likley, and with further infections she could have further seizures. I would favor continuing AED therapy at this time and have her follow up with outpatient neurology.    Recommendations: 1) continue Keppra 500 mg twice a day 2) No further recommendations at this time, please call with further questions or concerns.   Roland Rack, MD [facility] [phone]  If 7pm- 7am, please page neurology on call as listed in [place].

## 2017-09-20 NOTE — Evaluation (Signed)
Physical Therapy Evaluation Patient Details Name: Samantha Woods MRN: [id number] DOB: 27-Aug-1921 Today's Date: 09/20/2017   History of Present Illness  This 83 y.o. female admitted with confusion, lethargy, and non verbal.  Head CT negative for stroke.  She had witnessed seizure in ED.  Work up is underway with initial diagnosis  as seizure due to UTI, and toxic metabolic encephalopathy.  PMH includes:  HTN   Past Medical History:  Diagnosis Date  . GERD (gastroesophageal reflux disease)   . Hypertension   . Thyroid disease     Clinical Impression  Pt admitted with above diagnosis. Pt currently with functional limitations due to the deficits listed below (see PT Problem List). Pt was able to ambulate with RW with min assist and mod cues. Pt needing assist and son is in and out per pt.  Will need SNF and pt is willing to go and get REhab to get stronger prior to d/c home.  Will follow acutely.  Pt will benefit from skilled PT to increase their independence and safety with mobility to allow discharge to the venue listed below.    Follow Up Recommendations SNF;Supervision/Assistance - 24 hour    Equipment Recommendations  Other (comment) (TBA)    Recommendations for Other Services       Precautions / Restrictions Precautions Precautions: Fall Restrictions Weight Bearing Restrictions: No      Mobility  Bed Mobility Overal bed mobility: Needs Assistance Bed Mobility: Supine to Sit     Supine to sit: Mod assist     General bed mobility comments: assist to lift trunk and to scoot to EOB   Transfers Overall transfer level: Needs assistance Equipment used: Rolling walker (2 wheeled) Transfers: Sit to/from [facility] Sit to Stand: Mod assist;Min assist Stand pivot transfers: Min assist       General transfer comment: Pt requires assist to move into standing - initially was mod A, progressed to min A, and assist for balance.  Pt with positive urine and stool  noted on pad once she stood.  Transfer to 3N1 and pt had more BM and urinated again.   Ambulation/Gait Ambulation/Gait assistance: Min assist;Mod assist;+2 safety/equipment Ambulation Distance (Feet): 30 Feet Assistive device: Rolling walker (2 wheeled) Gait Pattern/deviations: Step-through pattern;Decreased stride length;Antalgic;Trunk flexed;Wide base of support;Drifts right/left;Leaning posteriorly   Gait velocity interpretation: Below normal speed for age/gender General Gait Details: Pt c/o left knee pain and weakness noted with LE not buckling but did flex more as she walked.  Pt needed asisst to steer RW and cues to sequence steps and RW.   Stairs            Wheelchair Mobility    Modified Rankin (Stroke Patients Only)       Balance Overall balance assessment: Needs assistance Sitting-balance support: Feet supported Sitting balance-Leahy Scale: Good     Standing balance support: Bilateral upper extremity supported Standing balance-Leahy Scale: Poor Standing balance comment: requires bil. UE support and min A                              Pertinent Vitals/Pain Pain Assessment: Faces Faces Pain Scale: Hurts little more Pain Location: Rt ribs  Pain Descriptors / Indicators: Grimacing;Guarding;Aching Pain Intervention(s): Limited activity within patient's tolerance;Monitored during session;Premedicated before session;Repositioned    Home Living Family/patient expects to be discharged to:: Private residence Living Arrangements: Children Available Help at Discharge: Family;Available PRN/intermittently Type of Home: House  Home Layout: One level Home Equipment: Cane - single point;Shower seat - built in Additional Comments: Pt lived with son who is "in and out", per pt     Prior Function Level of Independence: Independent with assistive device(s)         Comments: Pt reports she performed ADLs mod I, and ambulated with SPC when out of the  house      Hand Dominance   Dominant Hand: Right    Extremity/Trunk Assessment   Upper Extremity Assessment Upper Extremity Assessment: Generalized weakness;RUE deficits/detail RUE Deficits / Details: Pt with significant bruising over Rt shoulder area     Lower Extremity Assessment Lower Extremity Assessment: LLE deficits/detail LLE Deficits / Details: grossly 3-/5    Cervical / Trunk Assessment Cervical / Trunk Assessment: Kyphotic  Communication   Communication: HOH  Cognition Arousal/Alertness: Awake/alert Behavior During Therapy: WFL for tasks assessed/performed;Impulsive Overall Cognitive Status: No family/caregiver present to determine baseline cognitive functioning                                 General Comments: Cognition appears to Be [facility] for general info.  Brother stepped out of room, and was not present during eval to provide info re: her reliability       General Comments General comments (skin integrity, edema, etc.): VSS    Exercises     Assessment/Plan    PT Assessment Patient needs continued PT services  PT Problem List Decreased activity tolerance;Decreased balance;Decreased mobility;Decreased knowledge of use of DME;Decreased safety awareness;Decreased strength;Decreased knowledge of precautions;Pain       PT Treatment Interventions DME instruction;Gait training;Functional mobility training;Therapeutic activities;Therapeutic exercise;Balance training;Patient/family education    PT Goals (Current goals can be found in the Care Plan section)  Acute Rehab PT Goals Patient Stated Goal: to get back to normal  PT Goal Formulation: With patient Time For Goal Achievement: 10/04/17 Potential to Achieve Goals: Good    Frequency Min 3X/week   Barriers to discharge Decreased caregiver support      Co-evaluation PT/OT/SLP Co-Evaluation/Treatment: Yes Reason for Co-Treatment: For patient/therapist safety PT goals addressed during  session: Mobility/safety with mobility OT goals addressed during session: ADL's and self-care       AM-PAC PT "6 Clicks" Daily Activity  Outcome Measure Difficulty turning over in bed (including adjusting bedclothes, sheets and blankets)?: A Lot Difficulty moving from lying on back to sitting on the side of the bed? : A Lot Difficulty sitting down on and standing up from a chair with arms (e.g., wheelchair, bedside commode, etc,.)?: A Lot Help needed moving to and from a bed to chair (including a wheelchair)?: A Lot Help needed walking in hospital room?: A Lot Help needed climbing 3-5 steps with a railing? : Total 6 Click Score: 11    End of Session Equipment Utilized During Treatment: Gait belt Activity Tolerance: Patient limited by fatigue;Patient limited by pain Patient left: in chair;with call bell/phone within reach;with chair alarm set Nurse Communication: Mobility status PT Visit Diagnosis: Unsteadiness on feet (R26.81);Muscle weakness (generalized) (M62.81);Pain Pain - Right/Left: Left Pain - part of body: Shoulder    Time: 2725-3664 PT Time Calculation (min) (ACUTE ONLY): 30 min   Charges:   PT Evaluation $PT Eval Moderate Complexity: 1 Mod     PT G Codes:        Maurion Walk[place]ak,PT Acute Rehabilitation [phone] (pager)   Denice Paradise 09/20/2017, 4:48 PM

## 2017-09-20 NOTE — Progress Notes (Signed)
PROGRESS NOTE    Samantha Woods  DQQ:[id number] DOB: 1921-05-06 DOA: 09/18/2017 PCP: Janora Norlander, DO   Specialists:     Brief Narrative:   83 year old female Known history of cystocele with pelvic prolapse Reflux Osteoarthritis Hypertension Hypothyroidism Prediabetes  Admitted from home on 10/24 after being found down with what was presumed to be an accidental fall Also found to have metabolic encephalopathy of unclear cause-LP done did not confirm bacterial meningitis  Neurology was consulted as patient had seizure-like activity in the emergency room-given 1.5 mg of Ativan EEG was performed which was nonspecific Lactic acidosis2.3 on admission pro-calcitonin 0.1, BUN/creatinine 15/0.9 around same usual baseline LFTs normal point-of-care troponin norma WBC 22 platelets 389 Blood cultures and urine culture performed are pending   Assessment & Plan:   Active Problems:   Seizure ([place])   Acute encephalopathy   Hypertensive emergency   Musculoskeletal pain   GERD (gastroesophageal reflux disease)   Toxic metabolic encephalopathy probably of infectious etiology--?Klebsiella PNa Pyelo  D/w dr Leonel Ramsay stopping Acyclovir 450 every 8, ampicillin meningitis dosing 2 g every 6, vancomycin 1 g every  24--very low concern for this being meningitis given Neg LP  Blood culture neg, urine culture -K PNA --Change ceftriaxone to Pyelo coverage--patient has improved with empiric  Treatment  White count is improved from 20-->15, await cultures and narrow to oral antibiotic   TSH 2.2 B12 273  A MRI negative for any focal abnormality  ?  Seizure  EEG was performed-loaded with Keppra 1 g, continue 500 every 12 and converted to p.o. discussed with neurologist  -would continue this indefinitely given high probability of seizure  No antecedent history of seizure disorder, and was uinwitnessed   Reflux  Holding omeprazole for now, can give pantoprazole 40 daily if able to  take  Hypothyroidism not otherwise specified  Continue Synthroid 75 if able to take p.o.   Hypertension  Holding Aldactazide 25-25  Can cover with hydralazine 5-10 mg every 4 as needed blood pressure greater than 180  ?  Risk for aspiration  Patient was not given a diet yesterday and apparently failed a swallow eval although none requested  Requested nurse to see how patient does today on soft diet and graduate as tolerated     Lovenox DNR Discussed with son at bedside--will get therapy eval to evaluate and give Rex Transfer to telemetry  Consultants:   Neurology  Procedures:   EEG  Antimicrobials:   Acyclovir, ceftriaxone, ampicillin, vancomycin 10/24   Subjective:  Much more awake alert Asking for her computer in order to allow down Still a little foggy as to events that happened antecedent No other concerns at present time   Objective: Vitals:   09/19/17 1643 09/19/17 2215 09/20/17 0321 09/20/17 0839  BP: (!) 153/86 (!) 148/72 (!) 162/85 140/66  Pulse:  80 94 89  Resp: 13 (!) 23 (!) 25 20  Temp: 98 F (36.7 C) 98.4 F (36.9 C) 98.6 F (37 C) 97.6 F (36.4 C)  TempSrc: Axillary Axillary Axillary Axillary  SpO2:  95% 94% 95%  Weight:      Height:        Intake/Output Summary (Last 24 hours) at 09/20/17 1033 Last data filed at 09/20/17 0610  Gross per 24 hour  Intake          3112.08 ml  Output              200 ml  Net  2912.08 ml   Filed Weights   09/18/17 1237 09/18/17 1309 09/18/17 2157  Weight: 67.1 kg (148 lb) 69.9 kg (154 lb 1.6 oz) 66.6 kg (146 lb 13.2 oz)    Examination:  Frail pleasant more oriented moving all 4 limbs smile symmetric power 5/5 resist gravity abdomen soft nontender no rebound no lower extremity edema S1-S2 no murmur no is clear   Data Reviewed: I have personally reviewed following labs and imaging studies  CBC:  Recent Labs Lab 09/18/17 1221 09/19/17 0226 09/20/17 0238  WBC 22.8* 24.4* 15.0*   NEUTROABS  --   --  11.1*  HGB 15.5* 13.5 12.4  HCT 46.5* 41.7 38.0  MCV 91.9 92.3 92.7  PLT 389 380 161   Basic Metabolic Panel:  Recent Labs Lab 09/18/17 1221 09/19/17 0226  NA 140 139  K 3.4* 3.3*  CL 101 101  CO2 29 26  GLUCOSE 166* 141*  BUN 15 14  CREATININE 0.98 1.00  CALCIUM 9.0 8.3*   GFR: Estimated Creatinine Clearance: 28.6 mL/min (by C-G formula based on SCr of 1 mg/dL). Liver Function Tests:  Recent Labs Lab 09/18/17 1221 09/19/17 0226  AST 35 53*  ALT 47 47  ALKPHOS 86 71  BILITOT 1.2 1.6*  PROT 6.6 5.9*  ALBUMIN 3.5 3.3*   No results for input(s): LIPASE, AMYLASE in the last 168 hours.  Recent Labs Lab 09/18/17 1941  AMMONIA 30   Coagulation Profile: No results for input(s): INR, PROTIME in the last 168 hours. Cardiac Enzymes:  Recent Labs Lab 09/18/17 1405 09/19/17 0226  CKTOTAL 219 525*   BNP (last 3 results) No results for input(s): PROBNP in the last 8760 hours. HbA1C: No results for input(s): HGBA1C in the last 72 hours. CBG:  Recent Labs Lab 09/18/17 1225  GLUCAP 150*   Lipid Profile: No results for input(s): CHOL, HDL, LDLCALC, TRIG, CHOLHDL, LDLDIRECT in the last 72 hours. Thyroid Function Tests:  Recent Labs  09/20/17 0238  TSH 2.282   Anemia Panel:  Recent Labs  09/20/17 0238  VITAMINB12 273   Urine analysis:    Component Value Date/Time   COLORURINE STRAW (A) 09/18/2017 1358   APPEARANCEUR CLEAR 09/18/2017 1358   LABSPEC 1.017 09/18/2017 1358   PHURINE 8.0 09/18/2017 1358   GLUCOSEU 50 (A) 09/18/2017 1358   HGBUR SMALL (A) 09/18/2017 1358   BILIRUBINUR NEGATIVE 09/18/2017 1358   KETONESUR NEGATIVE 09/18/2017 1358   PROTEINUR NEGATIVE 09/18/2017 1358   NITRITE NEGATIVE 09/18/2017 [place] 09/18/2017 1358     Radiology Studies: Reviewed images personally in health database    Scheduled Meds: . aspirin EC  81 mg Oral Daily  . chlorhexidine  15 mL Mouth Rinse BID  .  levothyroxine  75 mcg Oral QAC breakfast  . mouth rinse  15 mL Mouth Rinse q12n4p  . mupirocin ointment   Nasal BID  . pantoprazole  40 mg Oral Daily   Continuous Infusions: . sodium chloride 50 mL/hr at 09/19/17 1600  . sodium chloride Stopped (09/18/17 1744)  . sodium chloride 75 mL/hr (09/20/17 0640)  . cefTRIAXone (ROCEPHIN)  IV    . levETIRAcetam Stopped (09/20/17 0228)     LOS: 2 days    Time spent: [place], MD [facility]   If 7PM-7AM, please contact night-coverage [email or web address] Password [email or web address] 09/20/2017, 10:33 AM

## 2017-09-20 NOTE — Evaluation (Signed)
Occupational Therapy Evaluation Patient Details Name: Samantha Woods MRN: [id number] DOB: 02-17-1921 Today's Date: 09/20/2017    History of Present Illness This 83 y.o. female admitted with confusion, lethargy, and non verbal.  Head CT negative for stroke.  She had witnessed seizure in ED.  Work up is underway with initial diagnosis  as seizure due to UTI, and toxic metabolic encephalopathy.  PMH includes:  HTN    Clinical Impression   Pt admitted with above. She demonstrates the below listed deficits and will benefit from continued OT to maximize safety and independence with BADLs.  Pt presents to OT with generalized weakness, decreased activity tolerance, impaired balance.  She was living with son PTA, and per her report, was mod I with ADLs.  Currently, she requires min - mod A, overall, and is at high risk for falls.  She reports she has been to SNF previously and is willing to consider it again.  Recommend SNF level rehab at discharge.        Follow Up Recommendations  SNF    Equipment Recommendations  None recommended by OT    Recommendations for Other Services       Precautions / Restrictions Precautions Precautions: Fall      Mobility Bed Mobility Overal bed mobility: Needs Assistance Bed Mobility: Supine to Sit     Supine to sit: Mod assist     General bed mobility comments: assist to lift trunk and to scoot to EOB   Transfers Overall transfer level: Needs assistance   Transfers: Sit to/from Stand;Stand Pivot Transfers Sit to Stand: Mod assist;Min assist Stand pivot transfers: Min assist       General transfer comment: Pt requires assist to move into standing - initially was mod A, progressed to min A, and assist for balance     Balance Overall balance assessment: Needs assistance Sitting-balance support: Feet supported Sitting balance-Leahy Scale: Good     Standing balance support: Bilateral upper extremity supported Standing balance-Leahy Scale:  Poor Standing balance comment: requires bil. UE support and min A                            ADL either performed or assessed with clinical judgement   ADL Overall ADL's : Needs assistance/impaired Eating/Feeding: Set up;Sitting   Grooming: Wash/dry hands;Wash/dry face;Oral care;Brushing hair;Minimal assistance;Sitting   Upper Body Bathing: Minimal assistance;Sitting   Lower Body Bathing: Moderate assistance;Sit to/from stand   Upper Body Dressing : Minimal assistance;Sitting   Lower Body Dressing: Moderate assistance;Sit to/from stand Lower Body Dressing Details (indicate cue type and reason): Pt is able to don/doff socks, but struggles.  Assist to pull pants over hips and assist for balance  Toilet Transfer: Minimal assistance;Stand-pivot;BSC;RW   Toileting- Clothing Manipulation and Hygiene: Maximal assistance;Sit to/from stand       Functional mobility during ADLs: Minimal assistance;Rolling walker General ADL Comments: Pt fatigues quickly.  Requires assist for balance and due to fatigue      Vision         Perception     Praxis      Pertinent Vitals/Pain Pain Assessment: Faces Faces Pain Scale: Hurts little more Pain Location: Rt ribs  Pain Descriptors / Indicators: Grimacing;Guarding Pain Intervention(s): Monitored during session     Hand Dominance Right   Extremity/Trunk Assessment Upper Extremity Assessment Upper Extremity Assessment: Generalized weakness;RUE deficits/detail RUE Deficits / Details: Pt with significant bruising over Rt shoulder area  Cervical / Trunk Assessment Cervical / Trunk Assessment: Kyphotic   Communication Communication Communication: HOH   Cognition Arousal/Alertness: Awake/alert Behavior During Therapy: WFL for tasks assessed/performed;Impulsive Overall Cognitive Status: No family/caregiver present to determine baseline cognitive functioning                                 General  Comments: Cognition appears to Be [facility] for general info.  Son stepped out of room, and was not present during eval to provide info re: her reliability    General Comments  VSS     Exercises     Shoulder Instructions      Home Living Family/patient expects to be discharged to:: Private residence Living Arrangements: Children Available Help at Discharge: Family;Available PRN/intermittently Type of Home: House       Home Layout: One level     Bathroom Shower/Tub: Occupational psychologist: Standard     Home Equipment: Cane - single point;Shower seat - built in   Additional Comments: Pt lived with son who is "in and out", per pt       Prior Functioning/Environment Level of Independence: Independent with assistive device(s)        Comments: Pt reports she performed ADLs mod I, and ambulated with [facility] when out of the house         OT Problem List: Decreased strength;Decreased activity tolerance;Impaired balance (sitting and/or standing);Decreased safety awareness;Decreased knowledge of use of DME or AE      OT Treatment/Interventions: Self-care/ADL training;DME and/or AE instruction;Therapeutic activities;Patient/family education;Balance training    OT Goals(Current goals can be found in the care plan section) Acute Rehab OT Goals Patient Stated Goal: to get back to normal  OT Goal Formulation: With patient Time For Goal Achievement: 10/04/17 Potential to Achieve Goals: Good ADL Goals Pt Will Perform Grooming: with min guard assist;standing Pt Will Perform Lower Body Bathing: with min guard assist;sit to/from stand Pt Will Transfer to Toilet: with min guard assist;ambulating;regular height toilet;bedside commode;grab bars Pt Will Perform Toileting - Clothing Manipulation and hygiene: with min guard assist;sit to/from stand  OT Frequency: Min 2X/week   Barriers to D/C: Decreased caregiver support          Co-evaluation PT/OT/SLP Co-Evaluation/Treatment:  Yes Reason for Co-Treatment: For patient/therapist safety   OT goals addressed during session: ADL's and self-care      AM-PAC PT "6 Clicks" Daily Activity     Outcome Measure Help from another person eating meals?: A Little Help from another person taking care of personal grooming?: A Little Help from another person toileting, which includes using toliet, bedpan, or urinal?: A Lot Help from another person bathing (including washing, rinsing, drying)?: A Lot Help from another person to put on and taking off regular upper body clothing?: A Little Help from another person to put on and taking off regular lower body clothing?: A Lot 6 Click Score: 15   End of Session Equipment Utilized During Treatment: Gait belt;Rolling walker Nurse Communication: Mobility status  Activity Tolerance: Patient limited by fatigue Patient left: in chair;with call bell/phone within reach;with chair alarm set  OT Visit Diagnosis: Unsteadiness on feet (R26.81)                Time: 8182-9937 OT Time Calculation (min): 28 min Charges:  OT General Charges $OT Visit: 1 Visit OT Evaluation $OT Eval Moderate Complexity: 1 Mod G-Codes:     Elmo Shumard,  OTR/L [phone]   Lucille Passy M 09/20/2017, 3:08 PM

## 2017-09-20 NOTE — Telephone Encounter (Signed)
I attempted to check in with patient's son, [name], regarding Samantha Woods current admission.  I have been following the hospitalization and understand that she is being evaluated for new onset seizure disorder possibly related to systemic infection.  Return phone call is not urgent.  I was simply calling to make sure that the patient and her family are doing okay during this difficult time.  Should they return my call, please let them know I am happy to see them after discharge from the hospital.  I appreciate the excellent care being provided by the inpatient team at [facility] and will continue to follow along with her hospital course.  Ashly M. Lajuana Ripple, [facility]

## 2017-09-21 ENCOUNTER — Inpatient Hospital Stay (HOSPITAL_COMMUNITY): Payer: PPO

## 2017-09-21 LAB — CSF CULTURE

## 2017-09-21 LAB — CSF CULTURE W GRAM STAIN: Culture: NO GROWTH

## 2017-09-21 MED ORDER — CEFUROXIME AXETIL 250 MG PO TABS
250.0000 mg | ORAL_TABLET | Freq: Two times a day (BID) | ORAL | Status: DC
  Administered 2017-09-21 – 2017-09-24 (×6): 250 mg via ORAL
  Filled 2017-09-21 (×7): qty 1

## 2017-09-21 NOTE — Progress Notes (Signed)
Samantha Woods is a 83 y.o. female patient is a transfer from 2 C awake, alert - oriented  X 4 - no acute distress noted.  VSS - Blood pressure (!) 157/61, pulse 77, temperature 98.2 F (36.8 C), temperature source Oral, resp. rate 20, height 4\' 11"  (1.499 m), weight 67 kg (147 lb 9.6 oz), SpO2 96 %.    IV in place, occlusive dsg intact without redness.  Orientation to room, and floor completed with information packet given to patient/family.  Patient declined safety video at this time.  Admission INP armband ID verified with patient/family, and in place.   SR up x 2, fall assessment complete, with patient and family able to verbalize understanding of risk associated with falls, and verbalized understanding to call nsg before up out of bed.  Call light within reach, patient able to voice, and demonstrate understanding.  Skin, clean-dry- intact with evidence of bruising, but no skin tears.   No evidence of skin break down noted on exam.   Will cont to eval and treat per MD orders.  Dorris Carnes, RN 09/21/2017 11:47 PM

## 2017-09-21 NOTE — Progress Notes (Signed)
PROGRESS NOTE    Samantha Woods  IRW:[id number] DOB: 27-Dec-1920 DOA: 09/18/2017 PCP: Janora Norlander, DO   Specialists:     Brief Narrative:   83 year old female Known history of cystocele with pelvic prolapse Reflux Osteoarthritis Hypertension Hypothyroidism Prediabetes  Admitted from home on 10/24 after being found down with what was presumed to be an accidental fall Also found to have metabolic encephalopathy of unclear cause-LP done did not confirm bacterial meningitis  Neurology was consulted as patient had seizure-like activity in the emergency room-given 1.5 mg of Ativan EEG was performed which was nonspecific Lactic acidosis2.3 on admission pro-calcitonin 0.1, BUN/creatinine 15/0.9 around same usual baseline LFTs normal point-of-care troponin norma WBC 22 platelets 389 Blood cultures and urine culture performed are pending   Assessment & Plan:   Active Problems:   Seizure ([place])   Acute encephalopathy   Hypertensive emergency   Musculoskeletal pain   GERD (gastroesophageal reflux disease)   Acute alteration in mental status   Toxic metabolic encephalopathy probably of infectious etiology--?Klebsiella PNa Pyelo  D/w dr Leonel Ramsay stopping Acyclovir 450 every 8, ampicillin meningitis dosing 2 g every 6, vancomycin 1 g every  24--very low concern for this being meningitis given Neg LP  Blood culture neg, urine culture -K PNA   White count is improved from 20-->15, transitioning to Ceftin from ceftriaxone  TSH 2.2 B12 273  A MRI negative for any focal abnormality  ?  Right shoulder pathology  Probably subacromial sprain given location in the back on provocative testing  Will rule out fracture with x-ray she does have some point tenderness over the acromion process as well  ?  Seizure  EEG was performed-loaded with Keppra 1 g, continue 500 every 12 and converted to p.o. discussed with  neurologist -would continue this indefinitely given high probability  of seizure  No antecedent history of seizure disorder, and was uinwitnessed   Reflux  pantoprazole 40 daily if able to take  Hypothyroidism not otherwise specified  Continue Synthroid 75  Patient TSH in 1-2 weeks   Hypertension  Holding Aldactazide 25-25  Can cover with hydralazine 5-10 mg every 4 as needed blood pressure greater than 180  ?  Risk for aspiration  No risk found patient was sleepy at the time--cont diet     Lovenox DNR No family present called family today but no answer Transfer to telemetry  Consultants:   Neurology  Procedures:   EEG  Antimicrobials:   Acyclovir, ceftriaxone, ampicillin, vancomycin 10/24   Subjective:  Much more awake alert Sitting in chair Nursing reports some pain in right shoulder patient confirms the same Therapies worked with her and deemed her suitable for skilled facility   Objective: Vitals:   09/20/17 2310 09/21/17 0421 09/21/17 0759 09/21/17 1155  BP: 138/72 (!) 155/74 (!) 164/75 (!) 164/87  Pulse:  93 85 95  Resp: 20 (!) 29 (!) 28 (!) 23  Temp: 98.4 F (36.9 C) 98.2 F (36.8 C) 98.3 F (36.8 C) 97.7 F (36.5 C)  TempSrc: Oral Oral Oral   SpO2: 100% 95% 98%   Weight:      Height:        Intake/Output Summary (Last 24 hours) at 09/21/17 1346 Last data filed at 09/21/17 0823  Gross per 24 hour  Intake             1075 ml  Output             1200 ml  Net             -  125 ml   Filed Weights   09/18/17 1237 09/18/17 1309 09/18/17 2157  Weight: 67.1 kg (148 lb) 69.9 kg (154 lb 1.6 oz) 66.6 kg (146 lb 13.2 oz)    Examination:  Frail pleasant more oriented moving all 4 limbs smile symmetric power 5/5 resist gravity abdomen soft nontender no rebound no lower extremity edema S1-S2 no murmur no is clear Bruising over right shoulder limited range of motion above 60 degrees flexion extension and Neer's and Hawkins signs are mildly positive with some point tenderness over the acromion process   Data  Reviewed: I have personally reviewed following labs and imaging studies  CBC:  Recent Labs Lab 09/18/17 1221 09/19/17 0226 09/20/17 0238  WBC 22.8* 24.4* 15.0*  NEUTROABS  --   --  11.1*  HGB 15.5* 13.5 12.4  HCT 46.5* 41.7 38.0  MCV 91.9 92.3 92.7  PLT 389 380 161   Basic Metabolic Panel:  Recent Labs Lab 09/18/17 1221 09/19/17 0226  NA 140 139  K 3.4* 3.3*  CL 101 101  CO2 29 26  GLUCOSE 166* 141*  BUN 15 14  CREATININE 0.98 1.00  CALCIUM 9.0 8.3*   GFR: Estimated Creatinine Clearance: 28.6 mL/min (by C-G formula based on SCr of 1 mg/dL). Liver Function Tests:  Recent Labs Lab 09/18/17 1221 09/19/17 0226  AST 35 53*  ALT 47 47  ALKPHOS 86 71  BILITOT 1.2 1.6*  PROT 6.6 5.9*  ALBUMIN 3.5 3.3*   No results for input(s): LIPASE, AMYLASE in the last 168 hours.  Recent Labs Lab 09/18/17 1941  AMMONIA 30   Coagulation Profile: No results for input(s): INR, PROTIME in the last 168 hours. Cardiac Enzymes:  Recent Labs Lab 09/18/17 1405 09/19/17 0226  CKTOTAL 219 525*   BNP (last 3 results) No results for input(s): PROBNP in the last 8760 hours. HbA1C: No results for input(s): HGBA1C in the last 72 hours. CBG:  Recent Labs Lab 09/18/17 1225  GLUCAP 150*   Lipid Profile: No results for input(s): CHOL, HDL, LDLCALC, TRIG, CHOLHDL, LDLDIRECT in the last 72 hours. Thyroid Function Tests:  Recent Labs  09/20/17 0238  TSH 2.282   Anemia Panel:  Recent Labs  09/20/17 0238  VITAMINB12 273   Urine analysis:    Component Value Date/Time   COLORURINE STRAW (A) 09/18/2017 1358   APPEARANCEUR CLEAR 09/18/2017 1358   LABSPEC 1.017 09/18/2017 1358   PHURINE 8.0 09/18/2017 1358   GLUCOSEU 50 (A) 09/18/2017 1358   HGBUR SMALL (A) 09/18/2017 1358   BILIRUBINUR NEGATIVE 09/18/2017 1358   KETONESUR NEGATIVE 09/18/2017 1358   PROTEINUR NEGATIVE 09/18/2017 1358   NITRITE NEGATIVE 09/18/2017 [place] 09/18/2017 1358      Radiology Studies: Reviewed images personally in health database    Scheduled Meds: . aspirin EC  81 mg Oral Daily  . cefUROXime  250 mg Oral BID WC  . chlorhexidine  15 mL Mouth Rinse BID  . levETIRAcetam  500 mg Oral BID  . levothyroxine  75 mcg Oral QAC breakfast  . mouth rinse  15 mL Mouth Rinse q12n4p  . mupirocin ointment   Nasal BID  . pantoprazole  40 mg Oral Daily   Continuous Infusions: . sodium chloride 75 mL/hr (09/20/17 0640)     LOS: 3 days    Time spent: [place], MD [facility]   If 7PM-7AM, please contact night-coverage [email or web address] Password [email or web address] 09/21/2017, 1:46 PM

## 2017-09-21 NOTE — Progress Notes (Signed)
Transferred to to [address] in the wheelchair, called pt's son [name] to let him know where she was transferred.

## 2017-09-21 NOTE — NC FL2 (Signed)
[place] MEDICAID FL2 LEVEL OF CARE SCREENING TOOL     IDENTIFICATION  Patient Name: Samantha Woods Birthdate: September 18, 1921 Sex: female Admission Date (Current Location): 09/18/2017  [facility] and [place] Number:  [employer] and Address:  [facility], [place] [address] [place], [place] [address]      Provider Number: [id number]  Attending Physician Name and Address:  Nita Sells, MD  Relative Name and Phone Number:       Current Level of Care: Hospital Recommended Level of Care: [place] Prior Approval Number:    Date Approved/Denied:   PASRR Number: [id number]  Discharge Plan: SNF    Current Diagnoses: Patient Active Problem List   Diagnosis Date Noted  . Acute alteration in mental status   . Seizure ([place]) 09/18/2017  . Acute encephalopathy 09/18/2017  . Hypertensive emergency 09/18/2017  . Musculoskeletal pain 09/18/2017  . GERD (gastroesophageal reflux disease) 09/18/2017  . Cystocele, midline 08/30/2017  . Tricompartmental disease of knee 08/30/2017  . Hypothyroidism 01/23/2015  . Gastroesophageal reflux disease with esophagitis 01/23/2015  . Essential hypertension 01/23/2015  . Degenerative arthritis of spine 01/23/2015  . Prediabetes 01/23/2015    Orientation RESPIRATION BLADDER Height & Weight     Self, Time, Situation, Place  Normal Incontinent, External catheter (catheter placed 09/19/17) Weight: 146 lb 13.2 oz (66.6 kg) Height:  4\' 11"  (149.9 cm)  BEHAVIORAL SYMPTOMS/MOOD NEUROLOGICAL BOWEL NUTRITION STATUS    Convulsions/Seizures Incontinent Diet (mechanical soft)  AMBULATORY STATUS COMMUNICATION OF NEEDS Skin   Limited Assist Verbally Normal                       Personal Care Assistance Level of Assistance  Bathing, Feeding, Dressing Bathing Assistance: Limited assistance Feeding assistance: Limited assistance Dressing Assistance: Limited assistance     Functional Limitations  Info  Hearing   Hearing Info: Impaired      SPECIAL CARE FACTORS FREQUENCY  PT (By licensed PT), OT (By licensed OT)     PT Frequency: 5x/wk OT Frequency: 5x/wk            Contractures      Additional Factors Info  Code Status, Allergies, Isolation Precautions Code Status Info: DNR Allergies Info: Sulfa Antibiotics, Darvon Propoxyphene     Isolation Precautions Info: Contact precautions; MRSA     Current Medications (09/21/2017):  This is the current hospital active medication list Current Facility-Administered Medications  Medication Dose Route Frequency Provider Last Rate Last Dose  . 0.9 %  sodium chloride infusion  75 mL/hr Intravenous Continuous Waldemar Dickens, MD 75 mL/hr at 09/20/17 0640 75 mL/hr at 09/20/17 0640  . acetaminophen (TYLENOL) tablet 1,000 mg  1,000 mg Oral Q6H PRN Nita Sells, MD   1,000 mg at 09/20/17 1606   Or  . acetaminophen (TYLENOL) suppository 975 mg  975 mg Rectal Q6H PRN Nita Sells, MD   975 mg at 09/20/17 0309  . aspirin EC tablet 81 mg  81 mg Oral Daily Waldemar Dickens, MD   81 mg at 09/21/17 7782  . cefUROXime (CEFTIN) tablet 250 mg  250 mg Oral BID WC Samtani, Jai-Gurmukh, MD      . chlorhexidine (PERIDEX) 0.12 % solution 15 mL  15 mL Mouth Rinse BID Rise Patience, MD   15 mL at 09/20/17 2152  . hydrALAZINE (APRESOLINE) injection 5-10 mg  5-10 mg Intravenous Q4H PRN Waldemar Dickens, MD      . levETIRAcetam (KEPPRA)  tablet 500 mg  500 mg Oral BID Nita Sells, MD   500 mg at 09/21/17 0854  . levothyroxine (SYNTHROID, LEVOTHROID) tablet 75 mcg  75 mcg Oral QAC breakfast Waldemar Dickens, MD   75 mcg at 09/21/17 0854  . LORazepam (ATIVAN) injection 2 mg  2 mg Intravenous Q4H PRN Waldemar Dickens, MD      . MEDLINE mouth rinse  15 mL Mouth Rinse q12n4p Rise Patience, MD   15 mL at 09/19/17 1600  . mupirocin ointment (BACTROBAN) 2 %   Nasal BID Nita Sells, MD      . ondansetron [facility])  tablet 4 mg  4 mg Oral Q6H PRN Waldemar Dickens, MD       Or  . ondansetron [facility]) injection 4 mg  4 mg Intravenous Q6H PRN Waldemar Dickens, MD      . pantoprazole (PROTONIX) EC tablet 40 mg  40 mg Oral Daily Waldemar Dickens, MD   40 mg at 09/21/17 3235     Discharge Medications: Please see discharge summary for a list of discharge medications.  Relevant Imaging Results:  Relevant Lab Results:   Additional Information SS#: [id number]  Geralynn Ochs, LCSW

## 2017-09-21 NOTE — Consult Note (Signed)
Orthopaedic Trauma Service (OTS) Consult   Patient ID: Samantha Woods MRN: [id number] DOB/AGE: 1921/04/05 83 y.o.  Reason for Consult:Right Clavicle Fracture Referring Physician: Verneita Griffes, MD of [facility]  HPI: Samantha Woods is an 83 y.o. female is being seen in consultation at the request of Dr. Verlon Au for evaluation of right clavicle fracture.  Patient fell down on 24 October.  She was found down at her bedside by her son.  She was brought to the emergency room and she was confused and weak.  The confusion got worse and she was eventually admitted.  Neurology was consulted for seizure-like activity .  EEG was nonspecific.  She had a metabolic encephalopathy and has subsequently been treated.  She is doing better.  However once her mental status came to was noted that she was complaining of right shoulder pain.  X-rays were obtained which showed a distal clavicle fracture.  I was consulted for recommendations regarding treatment.  There is no family at bedside but overall she is able to converse and is with that she is awake alert and oriented x3 to my evaluation.  She is complaining of right shoulder pain.  Denies any pain in her other extremity.  Denies any pain in her lower extremities.  Past Medical History:  Diagnosis Date  . GERD (gastroesophageal reflux disease)   . Hypertension   . Thyroid disease     Past Surgical History:  Procedure Laterality Date  . ABDOMINAL HYSTERECTOMY    . GALLBLADDER SURGERY      Family History  Problem Relation Age of Onset  . Heart disease Mother     Social History:  reports that she has never smoked. She has never used smokeless tobacco. She reports that she drinks alcohol. She reports that she does not use drugs.  Allergies:  Allergies  Allergen Reactions  . Sulfa Antibiotics Shortness Of Breath  . Darvon [Propoxyphene] Hives    Medications:  No current facility-administered medications on file prior to encounter.     Current Outpatient Prescriptions on File Prior to Encounter  Medication Sig Dispense Refill  . Cholecalciferol (VITAMIN D) 2000 UNITS CAPS Take 1 capsule by mouth.    . esomeprazole (NEXIUM) 20 MG capsule Take 1 capsule (20 mg total) by mouth daily at 12 noon. 90 capsule 0  . fluticasone (FLONASE) 50 MCG/ACT nasal spray Place 1 spray into both nostrils 2 (two) times daily as needed for allergies or rhinitis. 48 g 3  . levothyroxine (SYNTHROID, LEVOTHROID) 75 MCG tablet Take 1 tablet (75 mcg total) by mouth daily before breakfast. 90 tablet 3  . meloxicam (MOBIC) 7.5 MG tablet Take 1 tablet (7.5 mg total) by mouth daily. As needed for arthritic pain 30 tablet 1  . Omega-3 Fatty Acids (FISH OIL) 1000 MG CAPS Take 1 capsule by mouth daily.    . tizanidine (ZANAFLEX) 2 MG capsule Take 1 capsule (2 mg total) by mouth 3 (three) times daily as needed for muscle spasms. 60 capsule 1  . acetaminophen (TYLENOL) 325 MG tablet Take 650 mg by mouth every 4 (four) hours as needed.    . predniSONE (DELTASONE) 10 MG tablet Take 60mg  by mouth day 1, 50mg  day 2, 40mg  day 3, 30mg  day 4, 20mg  day 5, 10mg  day 6.  Then stop. (Patient not taking: Reported on 09/18/2017) 21 tablet 0    ROS: Constitutional: No fever or chills Vision: No changes in vision ENT: No difficulty swallowing CV: No chest pain Pulm: No SOB or  wheezing GI: No nausea or vomiting GU: No urgency or inability to hold urine Skin: No poor wound healing Neurologic: No numbness or tingling Psychiatric: No depression or anxiety Heme: No bruising Allergic: No reaction to medications or food   Exam: Blood pressure (!) 164/87, pulse 95, temperature 97.7 F (36.5 C), resp. rate (!) 23, height 4\' 11"  (1.499 m), weight 66.6 kg (146 lb 13.2 oz), SpO2 98 %. General:No acute distress Orientation:Awake and alert x3 this AM Mood and Affect: Cooperative and pleasant Gait: Not assessed Coordination and balance: Normal but slowed  Right upper  extremity: Skin reveals significant ecchymosis around the shoulder.  There are no skin lesions or skin tears.  There is obvious tenderness and minimal deformity.  She is unable to actively raise her arm more than 40 degrees of forward elevation, however passively I was able to get her up to at least 120 degrees.  Internal and external rotation without significant pain or discomfort.  She has full sensation in all nerve distributions into the hand.  Motor function to all nerve distribution is intact as well.  She has 2+ radial pulse.  No lymphadenopathy and normal reflexes.  Left upper extremity: Skin without lesions. No tenderness to palpation. Full painless ROM, full strength in each muscle groups without evidence of instability.   Medical Decision Making: Imaging: Right shoulder x-rays were reviewed which show a distal third clavicle fracture with some mild displacement of the medial fragment.  Overall no other significant findings.  She does have significant osteoporotic bone.  Labs: Hgb 13.5 Potassium 2.5  Medical history and chart was reviewed  Assessment/Plan: 83 year old female admitted with acute encephalopathy status post fall with right minimally displaced lateral third clavicle fracture  -Plan for nonoperative treatment -May be WBAT to RUE, sling for comfort -Follow up with me in 4-6 weeks  Shona Needles, MD [facility] [phone] (phone)

## 2017-09-22 DIAGNOSIS — S42031A Displaced fracture of lateral end of right clavicle, initial encounter for closed fracture: Secondary | ICD-10-CM

## 2017-09-22 DIAGNOSIS — R296 Repeated falls: Secondary | ICD-10-CM

## 2017-09-22 LAB — CBC
HCT: 40.1 % (ref 36.0–46.0)
Hemoglobin: 13.5 g/dL (ref 12.0–15.0)
MCH: 30.8 pg (ref 26.0–34.0)
MCHC: 33.7 g/dL (ref 30.0–36.0)
MCV: 91.3 fL (ref 78.0–100.0)
PLATELETS: 291 10*3/uL (ref 150–400)
RBC: 4.39 MIL/uL (ref 3.87–5.11)
RDW: 15 % (ref 11.5–15.5)
WBC: 15.5 10*3/uL — ABNORMAL HIGH (ref 4.0–10.5)

## 2017-09-22 LAB — BASIC METABOLIC PANEL
Anion gap: 12 (ref 5–15)
BUN: 5 mg/dL — AB (ref 6–20)
CHLORIDE: 102 mmol/L (ref 101–111)
CO2: 26 mmol/L (ref 22–32)
CREATININE: 0.78 mg/dL (ref 0.44–1.00)
Calcium: 8.5 mg/dL — ABNORMAL LOW (ref 8.9–10.3)
GFR calc Af Amer: >60 mL/min (ref >60)
GFR calc non Af Amer: >60 mL/min (ref >60)
Glucose, Bld: 131 mg/dL — ABNORMAL HIGH (ref 65–99)
Potassium: 2.5 mmol/L — CL (ref 3.5–5.1)
Sodium: 140 mmol/L (ref 135–145)

## 2017-09-22 MED ORDER — POTASSIUM CHLORIDE CRYS ER 20 MEQ PO TBCR
30.0000 meq | EXTENDED_RELEASE_TABLET | Freq: Two times a day (BID) | ORAL | Status: AC
  Administered 2017-09-22 (×2): 30 meq via ORAL
  Filled 2017-09-22 (×2): qty 1

## 2017-09-22 MED ORDER — POTASSIUM CHLORIDE 10 MEQ/100ML IV SOLN
10.0000 meq | INTRAVENOUS | Status: AC
  Administered 2017-09-22 (×4): 10 meq via INTRAVENOUS
  Filled 2017-09-22 (×4): qty 100

## 2017-09-22 NOTE — Progress Notes (Signed)
Pt with K+ of 2.5. Dr. Maudie Mercury paged to notify. Will monitor.

## 2017-09-22 NOTE — Progress Notes (Signed)
PROGRESS NOTE    Samantha Woods  HYW:[id number] DOB: 15-May-1921 DOA: 09/18/2017 PCP: Janora Norlander, DO   Specialists:     Brief Narrative:   83 year old female Known history of cystocele with pelvic prolapse Reflux Osteoarthritis Hypertension Hypothyroidism Prediabetes  Admitted from home on 10/24 after being found down with what was presumed to be an accidental fall Also found to have metabolic encephalopathy of unclear cause-LP done did not confirm bacterial meningitis  Neurology was consulted as patient had seizure-like activity in the emergency room-given 1.5 mg of Ativan EEG was performed which was nonspecific Lactic acidosis2.3 on admission pro-calcitonin 0.1, BUN/creatinine 15/0.9 around same usual baseline LFTs normal point-of-care troponin norma WBC 22 platelets 389 Blood cultures and urine culture performed are pending eventually found to have Klebsiella pneumonia pyelonephritis and multiple other antibiotics and coverage for meningitis was discontinued after discussion with neurologist   Assessment & Plan:   Active Problems:   Seizure ([place])   Acute encephalopathy   Hypertensive emergency   Musculoskeletal pain   GERD (gastroesophageal reflux disease)   Acute alteration in mental status   Traumatic closed fracture of distal clavicle with minimal displacement, right, initial encounter   Unwitnessed fall   Toxic metabolic encephalopathy probably of infectious etiology--?Klebsiella PNa Pyelo  D/w dr Leonel Ramsay and discontinued acyclovir and ampicillin and vancomycin for meningitis--low risk meningitis given Neg LP  Blood culture neg, urine culture -K PNA   White count is improved from 20-->15, transitioning to Ceftin from ceftriaxone  TSH 2.2 B12 273  A MRI negative for any focal abnormality  Distal right clavicular shaft fracture  \appreciate input from Dr. Doreatha Martin of orthopedics, has a sling on which she will continue to use  ?  Seizure  EEG was  performed-loaded with Keppra 1 g, continue 500 every 12 and converted to p.o. discussed with  neurologist -would continue this indefinitely given high probability of seizure  Outpatient follow-up needed  Reflux  pantoprazole 40 daily if able to take  Hypothyroidism not otherwise specified  Continue Synthroid 75    consider repeat TSH to 3 weeks  Hypertension  Holding Aldactazide 25-25  Can cover with hydralazine 5-10 mg every 4 as needed blood pressure greater than 180  ?  Risk for aspiration  Risk unfounded and clinically resolved  Lovenox DNR No family present called family today but no answer Transfer to telemetry  Consultants:   Neurology  Procedures:   EEG  Antimicrobials:   Acyclovir, ceftriaxone, ampicillin, vancomycin 10/24   Subjective:  Alert awake alert pleasant oriented no distress eating drinking Tolerating diet fairly well without issue Multiple family members at the bedside and patient without issue at this time Eating and drinking and in good spirits Pain is controlled in the right shoulder Has not positive stool as yet but otherwise no other complaint  Objective: Vitals:   09/22/17 0205 09/22/17 0535 09/22/17 0827 09/22/17 1402  BP: (!) 139/56 (!) 156/73 (!) 145/82 (!) 131/57  Pulse: 98 68 81 (!) 102  Resp: 18 18 19 19   Temp: 98 F (36.7 C) 98 F (36.7 C)  98 F (36.7 C)  TempSrc: Oral Oral    SpO2: 95% 100% 99% 99%  Weight:      Height:        Intake/Output Summary (Last 24 hours) at 09/22/17 1417 Last data filed at 09/22/17 1125  Gross per 24 hour  Intake             2800 ml  Output  251 ml  Net             2549 ml   Filed Weights   09/18/17 1309 09/18/17 2157 09/21/17 1913  Weight: 69.9 kg (154 lb 1.6 oz) 66.6 kg (146 lb 13.2 oz) 67 kg (147 lb 9.6 oz)    Examination:  Alert pleasant oriented no distress eating drinking Chest clinically clear right arm is in a sling Abdomen slightly distended soft No lower  extremity edema S1-S2 no murmur rub or gallop Neurologically is intact without any focal deficit and is oriented   Data Reviewed: I have personally reviewed following labs and imaging studies  CBC:  Recent Labs Lab 09/18/17 1221 09/19/17 0226 09/20/17 0238 09/22/17 0351  WBC 22.8* 24.4* 15.0* 15.5*  NEUTROABS  --   --  11.1*  --   HGB 15.5* 13.5 12.4 13.5  HCT 46.5* 41.7 38.0 40.1  MCV 91.9 92.3 92.7 91.3  PLT 389 380 296 650   Basic Metabolic Panel:  Recent Labs Lab 09/18/17 1221 09/19/17 0226 09/22/17 0351  NA 140 139 140  K 3.4* 3.3* 2.5*  CL 101 101 102  CO2 29 26 26   GLUCOSE 166* 141* 131*  BUN 15 14 5*  CREATININE 0.98 1.00 0.78  CALCIUM 9.0 8.3* 8.5*   GFR: Estimated Creatinine Clearance: 35.8 mL/min (by C-G formula based on SCr of 0.78 mg/dL). Liver Function Tests:  Recent Labs Lab 09/18/17 1221 09/19/17 0226  AST 35 53*  ALT 47 47  ALKPHOS 86 71  BILITOT 1.2 1.6*  PROT 6.6 5.9*  ALBUMIN 3.5 3.3*   No results for input(s): LIPASE, AMYLASE in the last 168 hours.  Recent Labs Lab 09/18/17 1941  AMMONIA 30   Coagulation Profile: No results for input(s): INR, PROTIME in the last 168 hours. Cardiac Enzymes:  Recent Labs Lab 09/18/17 1405 09/19/17 0226  CKTOTAL 219 525*   BNP (last 3 results) No results for input(s): PROBNP in the last 8760 hours. HbA1C: No results for input(s): HGBA1C in the last 72 hours. CBG:  Recent Labs Lab 09/18/17 1225  GLUCAP 150*   Lipid Profile: No results for input(s): CHOL, HDL, LDLCALC, TRIG, CHOLHDL, LDLDIRECT in the last 72 hours. Thyroid Function Tests:  Recent Labs  09/20/17 0238  TSH 2.282   Anemia Panel:  Recent Labs  09/20/17 0238  VITAMINB12 273   Urine analysis:    Component Value Date/Time   COLORURINE STRAW (A) 09/18/2017 1358   APPEARANCEUR CLEAR 09/18/2017 1358   LABSPEC 1.017 09/18/2017 1358   PHURINE 8.0 09/18/2017 1358   GLUCOSEU 50 (A) 09/18/2017 1358   HGBUR SMALL  (A) 09/18/2017 1358   BILIRUBINUR NEGATIVE 09/18/2017 1358   KETONESUR NEGATIVE 09/18/2017 1358   PROTEINUR NEGATIVE 09/18/2017 1358   NITRITE NEGATIVE 09/18/2017 [place] 09/18/2017 1358     Radiology Studies: Reviewed images personally in health database    Scheduled Meds: . aspirin EC  81 mg Oral Daily  . cefUROXime  250 mg Oral BID WC  . chlorhexidine  15 mL Mouth Rinse BID  . levETIRAcetam  500 mg Oral BID  . levothyroxine  75 mcg Oral QAC breakfast  . mouth rinse  15 mL Mouth Rinse q12n4p  . mupirocin ointment   Nasal BID  . pantoprazole  40 mg Oral Daily  . potassium chloride  30 mEq Oral BID   Continuous Infusions: . sodium chloride 75 mL/hr (09/22/17 0510)     LOS: 4 days  Time spent: [place], MD [facility] [phone]   If 7PM-7AM, please contact night-coverage [email or web address] Password [email or web address] 09/22/2017, 2:17 PM

## 2017-09-22 NOTE — Progress Notes (Signed)
Orthopedic Tech Progress Note Patient Details:  Samantha Woods 22-Sep-1921 [id number]  Ortho Devices Type of Ortho Device: Arm sling Ortho Device/Splint Location: rue Ortho Device/Splint Interventions: Application   Hildred Priest 09/22/2017, 10:27 AM

## 2017-09-23 ENCOUNTER — Telehealth: Payer: Self-pay

## 2017-09-23 ENCOUNTER — Inpatient Hospital Stay (HOSPITAL_COMMUNITY): Payer: PPO

## 2017-09-23 LAB — RENAL FUNCTION PANEL
ALBUMIN: 2.8 g/dL — AB (ref 3.5–5.0)
ANION GAP: 9 (ref 5–15)
CO2: 27 mmol/L (ref 22–32)
CREATININE: 0.73 mg/dL (ref 0.44–1.00)
Calcium: 8.4 mg/dL — ABNORMAL LOW (ref 8.9–10.3)
Chloride: 103 mmol/L (ref 101–111)
Glucose, Bld: 151 mg/dL — ABNORMAL HIGH (ref 65–99)
PHOSPHORUS: 2.6 mg/dL (ref 2.5–4.6)
Potassium: 2.9 mmol/L — ABNORMAL LOW (ref 3.5–5.1)
SODIUM: 139 mmol/L (ref 135–145)

## 2017-09-23 LAB — CULTURE, BLOOD (ROUTINE X 2)
CULTURE: NO GROWTH
Culture: NO GROWTH
Special Requests: ADEQUATE

## 2017-09-23 MED ORDER — MUSCLE RUB 10-15 % EX CREA
TOPICAL_CREAM | Freq: Two times a day (BID) | CUTANEOUS | Status: DC | PRN
  Administered 2017-09-23 – 2017-09-24 (×3): via TOPICAL
  Filled 2017-09-23: qty 85

## 2017-09-23 MED ORDER — CEFUROXIME AXETIL 250 MG PO TABS: 250.0000 mg | ORAL_TABLET | Freq: Two times a day (BID) | ORAL | 0 refills | Status: DC

## 2017-09-23 MED ORDER — LEVETIRACETAM 500 MG PO TABS: 500.0000 mg | ORAL_TABLET | Freq: Two times a day (BID) | ORAL | 1 refills | Status: DC

## 2017-09-23 NOTE — Clinical Social Work Note (Signed)
Clinical Social Work Assessment  Patient Details  Name: Samantha Woods MRN: [id number] Date of Birth: Apr 19, 1921  Date of referral:  09/23/17               Reason for consult:  Facility Placement                Permission sought to share information with:  Facility Sport and exercise psychologist, Family Supports Permission granted to share information::  Yes, Verbal Permission Granted  Name::     Market researcher::  SNFs  Relationship::  Son  Contact Information:  [phone]  Housing/Transportation Living arrangements for the past 2 months:  [place] of Information:  Patient, Adult Children Patient Interpreter Needed:  None Criminal Activity/Legal Involvement Pertinent to Current Situation/Hospitalization:  No - Comment as needed Significant Relationships:  Adult Children Lives with:  Self Do you feel safe going back to the place where you live?  No Need for family participation in patient care:  Yes (Comment)  Care giving concerns:  CSW received consult for possible SNF placement at time of discharge. CSW spoke with patient's son regarding PT recommendation of SNF placement at time of discharge. Patient reported that patient's son is currently unable to care for patient at her home given patient's current physical needs and fall risk. Patient expressed understanding of PT recommendation and is agreeable to SNF placement at time of discharge. CSW to continue to follow and assist with discharge planning needs.   Social Worker assessment / plan:  CSW spoke with patient concerning possibility of rehab at [facility] before returning home.  Employment status:  Retired Nurse, adult PT Recommendations:  [place] / Referral to community resources:  [place]  Patient/Family's Response to care:  Patient recognizes need for rehab before returning home and is agreeable to a SNF in [place]. Patient reported  preference for [facility] or [facility]. CSW explained that patient's insurance will need to be authorized.   Patient/Family's Understanding of and Emotional Response to Diagnosis, Current Treatment, and Prognosis:  Patient/family is realistic regarding therapy needs and expressed being hopeful for SNF placement. Patient expressed understanding of CSW role and discharge process as well as medical condition. No questions/concerns about plan or treatment.    Emotional Assessment Appearance:  Appears stated age Attitude/Demeanor/Rapport:  Other (Appropriate) Affect (typically observed):  Accepting, Appropriate Orientation:  Oriented to Self, Oriented to Situation, Oriented to Place, Oriented to  Time Alcohol / Substance use:  Not Applicable Psych involvement (Current and /or in the community):  No (Comment)  Discharge Needs  Concerns to be addressed:  Care Coordination Readmission within the last 30 days:  No Current discharge risk:  None Barriers to Discharge:  No Barriers Identified   Benard Halsted, [place] 09/23/2017, 1:38 PM

## 2017-09-23 NOTE — Progress Notes (Signed)
Physical Therapy Treatment Patient Details Name: Samantha Woods MRN: [id number] DOB: 04/10/1921 Today's Date: 09/23/2017    History of Present Illness This 83 y.o. female admitted with confusion, lethargy, and non verbal.  Head CT negative for stroke.  She had witnessed seizure in ED.  Work up is underway with initial diagnosis  as seizure due to UTI, and toxic metabolic encephalopathy.  PMH includes:  HTN .  R shoulder with distal clavicle fx WBAT sling for comfort. L hip pain, x- ray negative.     PT Comments    Pt continues to need mod assist overall for short distance gait with RW.  She is a high fall risk and continues to endorse left hip pain more than R shoulder pain with gait and mobility.  She remains appropriate for SNF level rehab at discharge.    Follow Up Recommendations  SNF;Supervision/Assistance - 24 hour     Equipment Recommendations  None recommended by PT    Recommendations for Other Services   NA     Precautions / Restrictions Precautions Precautions: Fall Required Braces or Orthoses: Sling (right arm for comfort)    Mobility  Bed Mobility               General bed mobility comments: Pt is OOB in the recliner chair.   Transfers Overall transfer level: Needs assistance Equipment used: Rolling walker (2 wheeled)   Sit to Stand: Mod assist         General transfer comment: Mod assist to support trunk during transitions.  Verbal cues for safe hand placement and assist needed to stabilize RW.   Ambulation/Gait Ambulation/Gait assistance: Mod assist Ambulation Distance (Feet): 40 Feet Assistive device: Rolling walker (2 wheeled) Gait Pattern/deviations: Step-through pattern;Shuffle;Trunk flexed Gait velocity: decreased Gait velocity interpretation: <1.8 ft/sec, indicative of risk for recurrent falls General Gait Details: Pt needed assist steering and contrilling RW, flexed trunk with RW too far away requiring verbal and manual cues to assist.   Pt with antalgic gait pattern favoring her left leg, seemed to do well WB lightly with her right arm on RW.            Balance Overall balance assessment: Needs assistance Sitting-balance support: Feet supported;Bilateral upper extremity supported Sitting balance-Leahy Scale: Fair     Standing balance support: Bilateral upper extremity supported Standing balance-Leahy Scale: Poor                              Cognition Arousal/Alertness: Awake/alert Behavior During Therapy: WFL for tasks assessed/performed Overall Cognitive Status: Within Functional Limits for tasks assessed                                 General Comments: Pt is very HOH which makes cognitive assessment difficult and may lead to some seeming confusion.       Exercises General Exercises - Lower Extremity Long Arc Quad: AROM;Both;10 reps Hip Flexion/Marching: AROM;AAROM;Right;Left;10 reps Toe Raises: AROM;Both;10 reps Heel Raises: AROM;Both;10 reps        Pertinent Vitals/Pain Pain Assessment: Faces Faces Pain Scale: Hurts little more Pain Location: left leg mostly Pain Descriptors / Indicators: Grimacing;Guarding;Aching Pain Intervention(s): Limited activity within patient's tolerance;Monitored during session;Repositioned           PT Goals (current goals can now be found in the care plan section) Acute Rehab PT Goals Patient Stated Goal: to get  back to normal  Progress towards PT goals: Progressing toward goals    Frequency    Min 2X/week (would benefit from more times per week at SNF 5 or >)      PT Plan Frequency needs to be updated       AM-PAC PT "6 Clicks" Daily Activity  Outcome Measure  Difficulty turning over in bed (including adjusting bedclothes, sheets and blankets)?: Unable Difficulty moving from lying on back to sitting on the side of the bed? : Unable Difficulty sitting down on and standing up from a chair with arms (e.g., wheelchair, bedside  commode, etc,.)?: Unable Help needed moving to and from a bed to chair (including a wheelchair)?: A Lot Help needed walking in hospital room?: A Lot Help needed climbing 3-5 steps with a railing? : Total 6 Click Score: 8    End of Session Equipment Utilized During Treatment: Gait belt;Other (comment) (right arm sling) Activity Tolerance: Patient limited by pain;Patient limited by fatigue Patient left: in chair;with call bell/phone within reach;with family/visitor present;with chair alarm set   PT Visit Diagnosis: Unsteadiness on feet (R26.81);Muscle weakness (generalized) (M62.81);Pain Pain - Right/Left: Left Pain - part of body: Hip     Time: 5379-4327 PT Time Calculation (min) (ACUTE ONLY): 27 min  Charges:  $Gait Training: 8-22 mins $Therapeutic Exercise: 8-22 mins          Eissa Buchberger B. [place], [place], DPT [phone]            09/23/2017, 3:37 PM

## 2017-09-23 NOTE — Telephone Encounter (Signed)
Ok!  I see.  If she is going to a rehab facility, they will follow up on lab work there.  I did not realize this was the case when I read her discharge summary.  No need to follow up with me until she is discharged from the facility.

## 2017-09-23 NOTE — Discharge Summary (Addendum)
Physician Discharge Summary  Samantha Woods FBP:[id number] DOB: Oct 08, 1921 DOA: 09/18/2017  PCP: Janora Norlander, DO  Admit date: 09/18/2017 Discharge date: 09/23/2017  Time spent: 25 minutes  Recommendations for Outpatient Follow-up:  1. Patient will require possible pessary or discussion of alternate options from Dr. Radford Pax of gynecology given pyelonephritis that might have arisen from reduction of cystocele 2. Recommend completion of Ceftin twice daily completely-new medication this admission with Keppra 500 twice daily---> she should not drive or operate heavy machinery for at least 6 months until seen or cleared by neurologist and should continue Keppra indefinitely 3. Suggest recheck TSH in 3 weeks and adjust Synthroid as needed 4. CBC plus differential as well as complete metabolic panel in 1 week 5. Will d/c to SNF for rehab -probably 10/30  Discharge Diagnoses:  Active Problems:   Seizure ([place])   Acute encephalopathy   Hypertensive emergency   Musculoskeletal pain   GERD (gastroesophageal reflux disease)   Acute alteration in mental status   Traumatic closed fracture of distal clavicle with minimal displacement, right, initial encounter   Unwitnessed fall   Discharge Condition: Improved  Diet recommendation: Heart healthy low-salt  Filed Weights   09/18/17 1309 09/18/17 2157 09/21/17 1913  Weight: 69.9 kg (154 lb 1.6 oz) 66.6 kg (146 lb 13.2 oz) 67 kg (147 lb 9.6 oz)    History of present illness:  83 year old female Known history of cystocele with pelvic prolapse Reflux Osteoarthritis Hypertension Hypothyroidism Prediabetes  Admitted from home on 10/24 after being found down with what was presumed to be an accidental fall Also found to have metabolic encephalopathy of unclear cause-LP done did not confirm bacterial meningitis  Neurology was consulted as patient had seizure-like activity in the emergency room-given 1.5 mg of Ativan EEG was performed  which was nonspecific Lactic acidosis2.3 on admission pro-calcitonin 0.1, BUN/creatinine 15/0.9 around same usual baseline LFTs normal point-of-care troponin norma WBC 22 platelets 389 Blood cultures and urine culture as below   Hospital Course:   Toxic metabolic encephalopathy probably of infectious etiology--?Klebsiella PNa Pyelo             D/w dr Leonel Ramsay and discontinued acyclovir and ampicillin and vancomycin for meningitis--low risk meningitis given Neg LP             Blood culture neg, urine culture -K PNA              White count is improved from 20-->15, transitioning to Ceftin from ceftriaxone             TSH 2.2 B12 273              Brain MRI negative for any focal abnormality  Distal right clavicular shaft fracture             Appreciate input from Dr. Doreatha Martin of orthopedics, has a sling on which she will continue to use  ?  Seizure             EEG was performed-loaded with Keppra 1 g, continue 500 every 12 and converted to p.o. discussed with       neurologist -would continue this indefinitely given high probability of seizure             Outpatient follow-up needed-would not discontinue the same, would not operate heavy machinery or drive unless cleared by neurologist  Reflux             pantoprazole 40 daily if able to take  Hypothyroidism not otherwise  specified             Continue Synthroid 75               consider repeat TSH to 3 weeks  Hypertension             Holding Aldactazide 25-25             Can cover with hydralazine 5-10 mg every 4 as needed blood pressure greater than 180  ?  Risk for aspiration             Risk unfounded and clinically resolved  Hip pain  No fracture on re-assessment-Hip xrays neg 10/29  Consultants:   Neurology  Procedures:   EEG  MRI  Antimicrobials:   Acyclovir, ceftriaxone, ampicillin, vancomycin 10/24  Ceftriaxone transition to Ceftin to complete therapy on 11/2  Discharge Exam: Vitals:    09/23/17 0020 09/23/17 0428  BP: (!) 165/77 (!) 152/65  Pulse: 79 96  Resp: 18 18  Temp: 98.9 F (37.2 C) 98.9 F (37.2 C)  SpO2: 97% 96%    General: Awake alert oriented no distress tolerating diet has some pain in her knees and some inner groin pain but otherwise looks fair Cardiovascular: S1-S2 no murmur rub or gallop Respiratory: Clinically clear without added sound Abdomen soft no rebound no guarding Neurologically is intact  Discharge Instructions    Current Discharge Medication List    CONTINUE these medications which have NOT CHANGED   Details  aspirin EC 81 MG tablet Take 81 mg by mouth daily.    Cholecalciferol (VITAMIN D) 2000 UNITS CAPS Take 1 capsule by mouth.    esomeprazole (NEXIUM) 20 MG capsule Take 1 capsule (20 mg total) by mouth daily at 12 noon. Qty: 90 capsule, Refills: 0    fluticasone (FLONASE) 50 MCG/ACT nasal spray Place 1 spray into both nostrils 2 (two) times daily as needed for allergies or rhinitis. Qty: 48 g, Refills: 3   Associated Diagnoses: Acute rhinosinusitis    levothyroxine (SYNTHROID, LEVOTHROID) 75 MCG tablet Take 1 tablet (75 mcg total) by mouth daily before breakfast. Qty: 90 tablet, Refills: 3    meloxicam (MOBIC) 7.5 MG tablet Take 1 tablet (7.5 mg total) by mouth daily. As needed for arthritic pain Qty: 30 tablet, Refills: 1    Omega-3 Fatty Acids (FISH OIL) 1000 MG CAPS Take 1 capsule by mouth daily.    spironolactone-hydrochlorothiazide (ALDACTAZIDE) 25-25 MG tablet Take 1 tablet by mouth daily as needed (high blood pressure).    tizanidine (ZANAFLEX) 2 MG capsule Take 1 capsule (2 mg total) by mouth 3 (three) times daily as needed for muscle spasms. Qty: 60 capsule, Refills: 1   Associated Diagnoses: Osteoarthritis of lumbar spine, unspecified spinal osteoarthritis complication status    acetaminophen (TYLENOL) 325 MG tablet Take 650 mg by mouth every 4 (four) hours as needed.    predniSONE (DELTASONE) 10 MG tablet  Take 60mg  by mouth day 1, 50mg  day 2, 40mg  day 3, 30mg  day 4, 20mg  day 5, 10mg  day 6.  Then stop. Qty: 21 tablet, Refills: 0   Associated Diagnoses: Tricompartment osteoarthritis of left knee; Osteoarthritis of lumbar spine, unspecified spinal osteoarthritis complication status       Allergies  Allergen Reactions  . Sulfa Antibiotics Shortness Of Breath  . Darvon [Propoxyphene] Hives      The results of significant diagnostics from this hospitalization (including imaging, microbiology, ancillary and laboratory) are listed below for reference.    Significant Diagnostic Studies:  Ct Angio Head W Or Wo Contrast  Result Date: 09/18/2017 CLINICAL DATA:  Code stroke.  Found on the floor.  Disoriented. EXAM: CT ANGIOGRAPHY HEAD AND NECK CT PERFUSION BRAIN TECHNIQUE: Multidetector CT imaging of the head and neck was performed using the standard protocol during bolus administration of intravenous contrast. Multiplanar CT image reconstructions and MIPs were obtained to evaluate the vascular anatomy. Carotid stenosis measurements (when applicable) are obtained utilizing NASCET criteria, using the distal internal carotid diameter as the denominator. Multiphase CT imaging of the brain was performed following IV bolus contrast injection. Subsequent parametric perfusion maps were calculated using RAPID software. CONTRAST:  50 cc Isovue 350 COMPARISON:  CT same day FINDINGS: CTA NECK FINDINGS Aortic arch: Atherosclerosis of the arch. No aneurysm or dissection. Branching pattern of the brachiocephalic vessels is normal without origin stenosis. Right carotid system: Common carotid artery is tortuous but widely patent to the bifurcation region. Carotid bifurcation shows atherosclerotic plaque but no stenosis or irregularity. Cervical ICA is tortuous but widely patent. Left carotid system: Common carotid artery widely patent to the bifurcation region. The vessel is tortuous. Atherosclerotic plaque at the carotid  bifurcation and ICA bulb but no narrowing of the ICA beyond the diameter of the more distal cervical ICA and therefore no stenosis. Vertebral arteries: Both vertebral artery origins are widely patent. Both vertebral arteries are widely patent through the cervical region to the foramen magnum. Skeleton: Ordinary cervical spondylosis. Other neck: No mass or lymphadenopathy. Upper chest: Chronic appearing pulmonary scarring. No active process identified. Review of the MIP images confirms the above findings CTA HEAD FINDINGS Anterior circulation: Both internal carotid arteries are patent through the skullbase and siphon regions. No siphon stenosis. The anterior and middle cerebral vessels are normal without proximal stenosis, aneurysm or vascular malformation. Posterior circulation: Both vertebral arteries are patent through the foramen magnum. Mild fusiform dilatation of the left vertebral artery at the dural penetration without focal aneurysm. Both vertebral arteries widely patent to the basilar. No basilar stenosis. Posterior circulation branch vessels are normal. Venous sinuses: Patent and normal. Anatomic variants: None significant Delayed phase: No abnormal enhancement Review of the MIP images confirms the above findings CT Brain Perfusion Findings: CBF (<30%) Volume: 57mL Perfusion (Tmax>6.0s) volume: 90mL Mismatch Volume: 50mL Infarction Location:None IMPRESSION: Normal perfusion exam. CT angiography shows ordinary mild atherosclerosis of the aortic arch in both carotid bifurcations but no stenosis or irregularity. No intracranial abnormal vascular finding. These results were called by telephone at the time of interpretation on 09/18/2017 at 1:35 pm to Dr. Roland Rack , who verbally acknowledged these results. Electronically Signed   By: Nelson Chimes M.D.   On: 09/18/2017 13:37   Dg Shoulder Right  Result Date: 09/21/2017 CLINICAL DATA:  Rotator cuff (capsule) sprain. Fell on Wednesday. Superior right  shoulder pain EXAM: RIGHT SHOULDER - 2+ VIEW COMPARISON:  None. FINDINGS: Displaced fracture of the distal right clavicle. Right humeral head appears normally positioned relative to the glenoid fossa. No fracture appreciated within the proximal right humerus or within the scapula. IMPRESSION: Displaced fracture of the distal right clavicle. Electronically Signed   By: Franki Cabot M.D.   On: 09/21/2017 14:59   Ct Angio Neck W Or Wo Contrast  Result Date: 09/18/2017 CLINICAL DATA:  Code stroke.  Found on the floor.  Disoriented. EXAM: CT ANGIOGRAPHY HEAD AND NECK CT PERFUSION BRAIN TECHNIQUE: Multidetector CT imaging of the head and neck was performed using the standard protocol during bolus administration of intravenous contrast. Multiplanar  CT image reconstructions and MIPs were obtained to evaluate the vascular anatomy. Carotid stenosis measurements (when applicable) are obtained utilizing NASCET criteria, using the distal internal carotid diameter as the denominator. Multiphase CT imaging of the brain was performed following IV bolus contrast injection. Subsequent parametric perfusion maps were calculated using RAPID software. CONTRAST:  50 cc Isovue 350 COMPARISON:  CT same day FINDINGS: CTA NECK FINDINGS Aortic arch: Atherosclerosis of the arch. No aneurysm or dissection. Branching pattern of the brachiocephalic vessels is normal without origin stenosis. Right carotid system: Common carotid artery is tortuous but widely patent to the bifurcation region. Carotid bifurcation shows atherosclerotic plaque but no stenosis or irregularity. Cervical ICA is tortuous but widely patent. Left carotid system: Common carotid artery widely patent to the bifurcation region. The vessel is tortuous. Atherosclerotic plaque at the carotid bifurcation and ICA bulb but no narrowing of the ICA beyond the diameter of the more distal cervical ICA and therefore no stenosis. Vertebral arteries: Both vertebral artery origins are  widely patent. Both vertebral arteries are widely patent through the cervical region to the foramen magnum. Skeleton: Ordinary cervical spondylosis. Other neck: No mass or lymphadenopathy. Upper chest: Chronic appearing pulmonary scarring. No active process identified. Review of the MIP images confirms the above findings CTA HEAD FINDINGS Anterior circulation: Both internal carotid arteries are patent through the skullbase and siphon regions. No siphon stenosis. The anterior and middle cerebral vessels are normal without proximal stenosis, aneurysm or vascular malformation. Posterior circulation: Both vertebral arteries are patent through the foramen magnum. Mild fusiform dilatation of the left vertebral artery at the dural penetration without focal aneurysm. Both vertebral arteries widely patent to the basilar. No basilar stenosis. Posterior circulation branch vessels are normal. Venous sinuses: Patent and normal. Anatomic variants: None significant Delayed phase: No abnormal enhancement Review of the MIP images confirms the above findings CT Brain Perfusion Findings: CBF (<30%) Volume: 70mL Perfusion (Tmax>6.0s) volume: 88mL Mismatch Volume: 68mL Infarction Location:None IMPRESSION: Normal perfusion exam. CT angiography shows ordinary mild atherosclerosis of the aortic arch in both carotid bifurcations but no stenosis or irregularity. No intracranial abnormal vascular finding. These results were called by telephone at the time of interpretation on 09/18/2017 at 1:35 pm to Dr. Roland Rack , who verbally acknowledged these results. Electronically Signed   By: Nelson Chimes M.D.   On: 09/18/2017 13:37   Ct Cervical Spine Wo Contrast  Result Date: 09/18/2017 CLINICAL DATA:  83 year old female with acute neck pain following fall. Initial encounter. EXAM: CT CERVICAL SPINE WITHOUT CONTRAST TECHNIQUE: Multidetector CT imaging of the cervical spine was performed without intravenous contrast. Multiplanar CT image  reconstructions were also generated. COMPARISON:  None. FINDINGS: Alignment: Normal. Skull base and vertebrae: No acute fracture. No primary bone lesion or focal pathologic process. Soft tissues and spinal canal: No prevertebral fluid or swelling. No visible canal hematoma. Disc levels: Moderate multilevel degenerative disc disease, spondylosis and facet arthropathy noted contributing to mild central and bony foraminal narrowing at multiple levels. Upper chest: No acute abnormality Other: None. IMPRESSION: 1. No evidence that no static evidence of acute injury to the cervical spine 2. Moderate multilevel degenerative changes. Electronically Signed   By: Margarette Canada M.D.   On: 09/18/2017 13:25   Mr Brain Wo Contrast  Result Date: 09/19/2017 CLINICAL DATA:  Seizure, new and nontraumatic. Altered mental status. EXAM: MRI HEAD WITHOUT CONTRAST TECHNIQUE: Multiplanar, multiecho pulse sequences of the brain and surrounding structures were obtained without intravenous contrast. COMPARISON:  Head CT and  CTA from yesterday FINDINGS: Brain: No acute infarction, hemorrhage, hydrocephalus, extra-axial collection or mass lesion. Age congruent cerebral volume loss and periventricular chronic microvascular ischemic change. Vascular: Major flow voids are preserved Skull and upper cervical spine: No evidence of marrow lesion. Sinuses/Orbits: No acute finding.  Bilateral cataract resection. Other: Moderate and sometimes advanced motion degradation. Gradient imaging in particular is nondiagnostic. IMPRESSION: 1. Senescent changes without acute finding. 2. Moderate to advance motion degradation. Electronically Signed   By: Monte Fantasia M.D.   On: 09/19/2017 14:13   Ct Cerebral Perfusion W Contrast  Result Date: 09/18/2017 CLINICAL DATA:  Code stroke.  Found on the floor.  Disoriented. EXAM: CT ANGIOGRAPHY HEAD AND NECK CT PERFUSION BRAIN TECHNIQUE: Multidetector CT imaging of the head and neck was performed using the  standard protocol during bolus administration of intravenous contrast. Multiplanar CT image reconstructions and MIPs were obtained to evaluate the vascular anatomy. Carotid stenosis measurements (when applicable) are obtained utilizing NASCET criteria, using the distal internal carotid diameter as the denominator. Multiphase CT imaging of the brain was performed following IV bolus contrast injection. Subsequent parametric perfusion maps were calculated using RAPID software. CONTRAST:  50 cc Isovue 350 COMPARISON:  CT same day FINDINGS: CTA NECK FINDINGS Aortic arch: Atherosclerosis of the arch. No aneurysm or dissection. Branching pattern of the brachiocephalic vessels is normal without origin stenosis. Right carotid system: Common carotid artery is tortuous but widely patent to the bifurcation region. Carotid bifurcation shows atherosclerotic plaque but no stenosis or irregularity. Cervical ICA is tortuous but widely patent. Left carotid system: Common carotid artery widely patent to the bifurcation region. The vessel is tortuous. Atherosclerotic plaque at the carotid bifurcation and ICA bulb but no narrowing of the ICA beyond the diameter of the more distal cervical ICA and therefore no stenosis. Vertebral arteries: Both vertebral artery origins are widely patent. Both vertebral arteries are widely patent through the cervical region to the foramen magnum. Skeleton: Ordinary cervical spondylosis. Other neck: No mass or lymphadenopathy. Upper chest: Chronic appearing pulmonary scarring. No active process identified. Review of the MIP images confirms the above findings CTA HEAD FINDINGS Anterior circulation: Both internal carotid arteries are patent through the skullbase and siphon regions. No siphon stenosis. The anterior and middle cerebral vessels are normal without proximal stenosis, aneurysm or vascular malformation. Posterior circulation: Both vertebral arteries are patent through the foramen magnum. Mild  fusiform dilatation of the left vertebral artery at the dural penetration without focal aneurysm. Both vertebral arteries widely patent to the basilar. No basilar stenosis. Posterior circulation branch vessels are normal. Venous sinuses: Patent and normal. Anatomic variants: None significant Delayed phase: No abnormal enhancement Review of the MIP images confirms the above findings CT Brain Perfusion Findings: CBF (<30%) Volume: 103mL Perfusion (Tmax>6.0s) volume: 48mL Mismatch Volume: 67mL Infarction Location:None IMPRESSION: Normal perfusion exam. CT angiography shows ordinary mild atherosclerosis of the aortic arch in both carotid bifurcations but no stenosis or irregularity. No intracranial abnormal vascular finding. These results were called by telephone at the time of interpretation on 09/18/2017 at 1:35 pm to Dr. Roland Rack , who verbally acknowledged these results. Electronically Signed   By: Nelson Chimes M.D.   On: 09/18/2017 13:37   Dg Chest Port 1 View  Result Date: 09/18/2017 CLINICAL DATA:  Altered mental status EXAM: PORTABLE CHEST 1 VIEW COMPARISON:  None. FINDINGS: Heart is borderline enlarged. Mild vascular congestion. Left basilar opacity likely reflects atelectasis. No effusions or acute bony abnormality. IMPRESSION: Borderline cardiomegaly, vascular congestion. Left  base atelectasis. Electronically Signed   By: Rolm Baptise M.D.   On: 09/18/2017 13:54   Dg Hip Unilat With Pelvis 2-3 Views Left  Result Date: 09/23/2017 CLINICAL DATA:  Pain following recent fall EXAM: DG HIP (WITH OR WITHOUT PELVIS) 2-3V LEFT COMPARISON:  None. FINDINGS: Frontal pelvis as well as frontal and lateral left hip images were obtained. No fracture or dislocation. Joint spaces appear normal. No erosive change. Sacroiliac joints appear unremarkable bilaterally. IMPRESSION: No fracture or dislocation.  No appreciable arthropathy. Electronically Signed   By: Lowella Grip III M.D.   On: 09/23/2017 12:07    Ct Head Code Stroke Wo Contrast  Result Date: 09/18/2017 CLINICAL DATA:  Code stroke. Found on the floor this morning. Confusion. EXAM: CT HEAD WITHOUT CONTRAST TECHNIQUE: Contiguous axial images were obtained from the base of the skull through the vertex without intravenous contrast. COMPARISON:  None. FINDINGS: Brain: Generalized atrophy. Chronic small-vessel ischemic changes of the white matter. No sign of acute infarction, mass lesion, hemorrhage, hydrocephalus or extra-axial collection. Vascular: There is atherosclerotic calcification of the major vessels at the base of the brain. Skull: Negative Sinuses/Orbits: Clear/normal Other: None ASPECTS ([place] Stroke Program Early CT Score) - Ganglionic level infarction (caudate, lentiform nuclei, internal capsule, insula, M1-M3 cortex): 7 - Supraganglionic infarction (M4-M6 cortex): 3 Total score (0-10 with 10 being normal): 10 IMPRESSION: 1. Atrophy and chronic small-vessel ischemic changes. No acute or subacute finding. 2. ASPECTS is 10. These results were called by telephone at the time of interpretation on 09/18/2017 at 1:10 pm to Dr. Leonel Ramsay, who verbally acknowledged these results. Electronically Signed   By: Nelson Chimes M.D.   On: 09/18/2017 13:13    Microbiology: Recent Results (from the past 240 hour(s))  Urine culture     Status: Abnormal   Collection Time: 09/18/17  1:58 PM  Result Value Ref Range Status   Specimen Description URINE, CATHETERIZED  Final   Special Requests NONE  Final   Culture >=100,000 COLONIES/mL KLEBSIELLA PNEUMONIAE (A)  Final   Report Status 09/20/2017 FINAL  Final   Organism ID, Bacteria KLEBSIELLA PNEUMONIAE (A)  Final      Susceptibility   Klebsiella pneumoniae - MIC*    AMPICILLIN >=32 RESISTANT Resistant     CEFAZOLIN <=4 SENSITIVE Sensitive     CEFTRIAXONE <=1 SENSITIVE Sensitive     CIPROFLOXACIN <=0.25 SENSITIVE Sensitive     GENTAMICIN <=1 SENSITIVE Sensitive     IMIPENEM <=0.25 SENSITIVE  Sensitive     NITROFURANTOIN 32 SENSITIVE Sensitive     TRIMETH/SULFA <=20 SENSITIVE Sensitive     AMPICILLIN/SULBACTAM 4 SENSITIVE Sensitive     PIP/TAZO <=4 SENSITIVE Sensitive     Extended ESBL NEGATIVE Sensitive     * >=100,000 COLONIES/mL KLEBSIELLA PNEUMONIAE  Blood Culture (routine x 2)     Status: None (Preliminary result)   Collection Time: 09/18/17  2:05 PM  Result Value Ref Range Status   Specimen Description BLOOD RIGHT ANTECUBITAL  Final   Special Requests   Final    BOTTLES DRAWN AEROBIC AND ANAEROBIC Blood Culture results may not be optimal due to an excessive volume of blood received in culture bottles   Culture NO GROWTH 4 DAYS  Final   Report Status PENDING  Incomplete  Blood Culture (routine x 2)     Status: None (Preliminary result)   Collection Time: 09/18/17  2:30 PM  Result Value Ref Range Status   Specimen Description BLOOD LEFT FOREARM  Final   Special  Requests   Final    BOTTLES DRAWN AEROBIC AND ANAEROBIC Blood Culture adequate volume   Culture NO GROWTH 4 DAYS  Final   Report Status PENDING  Incomplete  CSF culture     Status: None   Collection Time: 09/18/17  2:35 PM  Result Value Ref Range Status   Specimen Description CSF  Final   Special Requests NONE  Final   Gram Stain   Final    WBC PRESENT, PREDOMINANTLY MONONUCLEAR NO ORGANISMS SEEN CYTOSPIN SMEAR    Culture NO GROWTH 3 DAYS  Final   Report Status 09/21/2017 FINAL  Final  Hsv Culture And Typing     Status: None   Collection Time: 09/18/17  2:35 PM  Result Value Ref Range Status   HSV Culture/Type Comment  Final    Comment: (NOTE) Negative No Herpes simplex virus isolated. Performed At: [facility] [place], [place] [id number] Lindon Romp MD PH:[id number]    Source of Sample CSF  Final  MRSA PCR Screening     Status: Abnormal   Collection Time: 09/18/17 10:39 PM  Result Value Ref Range Status   MRSA by PCR POSITIVE (A) NEGATIVE Final    Comment:         The GeneXpert MRSA Assay (FDA approved for NASAL specimens only), is one component of a comprehensive MRSA colonization surveillance program. It is not intended to diagnose MRSA infection nor to guide or monitor treatment for MRSA infections. RESULT CALLED TO, READ BACK BY AND VERIFIED WITH: D.HONEYCUTT RN 09/19/17 0238 L.CHAMPION      Labs: Basic Metabolic Panel:  Recent Labs Lab 09/18/17 1221 09/19/17 0226 09/22/17 0351 09/23/17 0919  NA 140 139 140 139  K 3.4* 3.3* 2.5* 2.9*  CL 101 101 102 103  CO2 29 26 26 27   GLUCOSE 166* 141* 131* 151*  BUN 15 14 5* <5*  CREATININE 0.98 1.00 0.78 0.73  CALCIUM 9.0 8.3* 8.5* 8.4*  PHOS  --   --   --  2.6   Liver Function Tests:  Recent Labs Lab 09/18/17 1221 09/19/17 0226 09/23/17 0919  AST 35 53*  --   ALT 47 47  --   ALKPHOS 86 71  --   BILITOT 1.2 1.6*  --   PROT 6.6 5.9*  --   ALBUMIN 3.5 3.3* 2.8*   No results for input(s): LIPASE, AMYLASE in the last 168 hours.  Recent Labs Lab 09/18/17 1941  AMMONIA 30   CBC:  Recent Labs Lab 09/18/17 1221 09/19/17 0226 09/20/17 0238 09/22/17 0351  WBC 22.8* 24.4* 15.0* 15.5*  NEUTROABS  --   --  11.1*  --   HGB 15.5* 13.5 12.4 13.5  HCT 46.5* 41.7 38.0 40.1  MCV 91.9 92.3 92.7 91.3  PLT 389 380 296 291   Cardiac Enzymes:  Recent Labs Lab 09/18/17 1405 09/19/17 0226  CKTOTAL 219 525*   BNP: BNP (last 3 results) No results for input(s): BNP in the last 8760 hours.  ProBNP (last 3 results) No results for input(s): PROBNP in the last 8760 hours.  CBG:  Recent Labs Lab 09/18/17 1225  GLUCAP 150*       Signed:  Nita Sells MD   [facility] 09/23/2017, 12:46 PM

## 2017-09-23 NOTE — Telephone Encounter (Signed)
Son states that they are still at the hospital and hoping she gets discharged today. If she does then she will go to a nursing facility but he is not sure which one yet. Still advised that she will still need a week follow up with Dr. Darnell Level and son wanted me to ask and make sure. Please advise

## 2017-09-23 NOTE — Telephone Encounter (Signed)
Left detailed message per son request.

## 2017-09-23 NOTE — Progress Notes (Addendum)
3:52pm-Patient's family have selected [facility]. CSW provided PT update to [facility]; they will let CSW know of approval tomorrow.   1:40pm-CSW submitted SNF request to [facility] insurance and requested that PT see patient again for determination. Patient's son is deciding between two SNFs.  Percell Locus Loucinda Croy LCSWA [phone]

## 2017-09-24 DIAGNOSIS — S42031A Displaced fracture of lateral end of right clavicle, initial encounter for closed fracture: Secondary | ICD-10-CM | POA: Diagnosis not present

## 2017-09-24 DIAGNOSIS — R29898 Other symptoms and signs involving the musculoskeletal system: Secondary | ICD-10-CM | POA: Diagnosis not present

## 2017-09-24 DIAGNOSIS — M6281 Muscle weakness (generalized): Secondary | ICD-10-CM | POA: Diagnosis not present

## 2017-09-24 DIAGNOSIS — Z9181 History of falling: Secondary | ICD-10-CM | POA: Diagnosis not present

## 2017-09-24 DIAGNOSIS — R569 Unspecified convulsions: Secondary | ICD-10-CM | POA: Diagnosis not present

## 2017-09-24 DIAGNOSIS — I10 Essential (primary) hypertension: Secondary | ICD-10-CM | POA: Diagnosis not present

## 2017-09-24 DIAGNOSIS — M25511 Pain in right shoulder: Secondary | ICD-10-CM | POA: Diagnosis not present

## 2017-09-24 DIAGNOSIS — S42031D Displaced fracture of lateral end of right clavicle, subsequent encounter for fracture with routine healing: Secondary | ICD-10-CM | POA: Diagnosis not present

## 2017-09-24 DIAGNOSIS — E039 Hypothyroidism, unspecified: Secondary | ICD-10-CM | POA: Diagnosis not present

## 2017-09-24 DIAGNOSIS — R262 Difficulty in walking, not elsewhere classified: Secondary | ICD-10-CM | POA: Diagnosis not present

## 2017-09-24 DIAGNOSIS — N10 Acute tubulo-interstitial nephritis: Secondary | ICD-10-CM | POA: Diagnosis not present

## 2017-09-24 DIAGNOSIS — G9340 Encephalopathy, unspecified: Secondary | ICD-10-CM | POA: Diagnosis not present

## 2017-09-24 LAB — BASIC METABOLIC PANEL
Anion gap: 8 (ref 5–15)
BUN: 5 mg/dL — AB (ref 6–20)
CHLORIDE: 105 mmol/L (ref 101–111)
CO2: 28 mmol/L (ref 22–32)
CREATININE: 0.8 mg/dL (ref 0.44–1.00)
Calcium: 8.4 mg/dL — ABNORMAL LOW (ref 8.9–10.3)
GFR calc Af Amer: >60 mL/min (ref >60)
GFR calc non Af Amer: >60 mL/min (ref >60)
Glucose, Bld: 187 mg/dL — ABNORMAL HIGH (ref 65–99)
POTASSIUM: 2.8 mmol/L — AB (ref 3.5–5.1)
SODIUM: 141 mmol/L (ref 135–145)

## 2017-09-24 MED ORDER — MUSCLE RUB 10-15 % EX CREA: 1.0000 "application " | TOPICAL_CREAM | Freq: Two times a day (BID) | CUTANEOUS | 0 refills | Status: AC | PRN

## 2017-09-24 MED ORDER — POTASSIUM CHLORIDE 40 MEQ/15ML (20%) PO SOLN: 40.0000 meq | Freq: Two times a day (BID) | ORAL | 0 refills | Status: DC

## 2017-09-24 NOTE — Clinical Social Work Placement (Signed)
   CLINICAL SOCIAL WORK PLACEMENT  NOTE  Date:  09/24/2017  Patient Details  Name: [name] MRN: [id number] Date of Birth: 04-18-1921  Clinical Social Work is seeking post-discharge placement for this patient at the [place] level of care (*CSW will initial, date and re-position this form in  chart as items are completed):  Yes   Patient/family provided with [place] Work Department's list of facilities offering this level of care within the geographic area requested by the patient (or if unable, by the patient's family).  Yes   Patient/family informed of their freedom to choose among providers that offer the needed level of care, that participate in Medicare, Medicaid or managed care program needed by the patient, have an available bed and are willing to accept the patient.  Yes   Patient/family informed of [facility]'s ownership interest in [facility] and [facility], as well as of the fact that they are under no obligation to receive care at these facilities.  PASRR submitted to [employer] on 09/23/17     PASRR number received on 09/23/17     Existing PASRR number confirmed on       FL2 transmitted to all facilities in geographic area requested by pt/family on 09/23/17     FL2 transmitted to all facilities within larger geographic area on       Patient informed that his/her managed care company has contracts with or will negotiate with certain facilities, including the following:        Yes   Patient/family informed of bed offers received.  Patient chooses bed at [facility]     Physician recommends and patient chooses bed at      Patient to be transferred to [facility] on 09/24/17.  Patient to be transferred to facility by [facility]     Patient family notified on 09/24/17 of transfer.  Name of family member notified:  son     PHYSICIAN Please sign DNR     Additional Comment:     _______________________________________________ Samantha Woods, [place] 09/24/2017, 9:16 AM

## 2017-09-24 NOTE — Discharge Summary (Signed)
Physician Discharge Summary  Samantha Woods WIO:[id number] DOB: 01-17-1921 DOA: 09/18/2017  PCP: Janora Norlander, DO Updated--patient seen examined 10/30 and stable for SNF d/c Admit date: 09/18/2017 Discharge date: 09/24/2017  Time spent: 25 minutes  Recommendations for Outpatient Follow-up:  1. Patient will require possible pessary or discussion of alternate options from Dr. Radford Pax of gynecology given pyelonephritis that might have arisen from reduction of cystocele 2. Recommend completion of Ceftin twice daily completely-new medication this admission with Keppra 500 twice daily---> she should not drive or operate heavy machinery for at least 6 months until seen or cleared by neurologist and should continue Keppra indefinitely 3. Also added 40 meq bid potassium this admit--rehcekc labs at SNF 2-3 days 4. Suggest recheck TSH in 3 weeks and adjust Synthroid as needed 5. CBC plus differential as well as complete metabolic panel in 1 week 6. Will d/c to SNF for rehab -probably 10/30  Discharge Diagnoses:  Active Problems:   Seizure ([place])   Acute encephalopathy   Hypertensive emergency   Musculoskeletal pain   GERD (gastroesophageal reflux disease)   Acute alteration in mental status   Traumatic closed fracture of distal clavicle with minimal displacement, right, initial encounter   Unwitnessed fall   Discharge Condition: Improved  Diet recommendation: Heart healthy low-salt  Filed Weights   09/18/17 1309 09/18/17 2157 09/21/17 1913  Weight: 69.9 kg (154 lb 1.6 oz) 66.6 kg (146 lb 13.2 oz) 67 kg (147 lb 9.6 oz)    History of present illness:  83 year old female Known history of cystocele with pelvic prolapse Reflux Osteoarthritis Hypertension Hypothyroidism Prediabetes  Admitted from home on 10/24 after being found down with what was presumed to be an accidental fall Also found to have metabolic encephalopathy of unclear cause-LP done did not confirm bacterial  meningitis  Neurology was consulted as patient had seizure-like activity in the emergency room-given 1.5 mg of Ativan EEG was performed which was nonspecific Lactic acidosis2.3 on admission pro-calcitonin 0.1, BUN/creatinine 15/0.9 around same usual baseline LFTs normal point-of-care troponin norma WBC 22 platelets 389 Blood cultures and urine culture as below   Hospital Course:   Toxic metabolic encephalopathy probably of infectious etiology--?Klebsiella PNa Pyelo             D/w dr Leonel Ramsay and discontinued acyclovir and ampicillin and vancomycin for meningitis--low risk meningitis given Neg LP             Blood culture neg, urine culture -K PNA              White count is improved from 20-->15, transitioning to Ceftin from ceftriaxone             TSH 2.2 B12 273              Brain MRI negative for any focal abnormality  Distal right clavicular shaft fracture             Appreciate input from Dr. Doreatha Martin of orthopedics, has a sling on which she will continue to use  Hypokalemia  Added K this admit 40 bid--rechekc labs soon  ?  Seizure             EEG was performed-loaded with Keppra 1 g, continue 500 every 12 and converted to p.o. discussed with       neurologist -would continue this indefinitely given high probability of seizure             Outpatient follow-up needed-would not discontinue the same, would not operate  heavy machinery or drive unless cleared by neurologist  Reflux             pantoprazole 40 daily if able to take  Hypothyroidism not otherwise specified             Continue Synthroid 75               consider repeat TSH to 3 weeks  Hypertension             Holding Aldactazide 25-25             Can cover with hydralazine 5-10 mg every 4 as needed blood pressure greater than 180  ?  Risk for aspiration             Risk unfounded and clinically resolved  Hip pain  No fracture on re-assessment-Hip xrays neg 10/29  Consultants:    Neurology  Procedures:   EEG  MRI  Antimicrobials:   Acyclovir, ceftriaxone, ampicillin, vancomycin 10/24  Ceftriaxone transition to Ceftin to complete therapy on 11/2  Discharge Exam: Vitals:   09/24/17 0311 09/24/17 0527  BP: (!) 148/97 (!) 145/89  Pulse: 81 82  Resp: 19 17  Temp: 97.6 F (36.4 C) 97.6 F (36.4 C)  SpO2: 100% 99%    General: Awake alert oriented Cardiovascular: S1-S2 no murmur rub or gallop Respiratory: Clinically clear without added sound Abdomen soft no rebound no guarding Knees not swplloen and some better with cream Neurologically is intact  Discharge Instructions   Discharge Instructions    Diet - low sodium heart healthy    Complete by:  As directed    Increase activity slowly    Complete by:  As directed      Current Discharge Medication List    START taking these medications   Details  cefUROXime (CEFTIN) 250 MG tablet Take 1 tablet (250 mg total) by mouth 2 (two) times daily with a meal. Qty: 8 tablet, Refills: 0    levETIRAcetam (KEPPRA) 500 MG tablet Take 1 tablet (500 mg total) by mouth 2 (two) times daily. Qty: 60 tablet, Refills: 1    Menthol-Methyl Salicylate (MUSCLE RUB) 10-15 % CREA Apply 1 application topically 2 (two) times daily as needed for muscle pain. Refills: 0    Potassium Chloride 40 MEQ/15ML (20%) SOLN Take 40 mEq by mouth 2 (two) times daily. Qty: 300 mL, Refills: 0      CONTINUE these medications which have NOT CHANGED   Details  aspirin EC 81 MG tablet Take 81 mg by mouth daily.    Cholecalciferol (VITAMIN D) 2000 UNITS CAPS Take 1 capsule by mouth.    esomeprazole (NEXIUM) 20 MG capsule Take 1 capsule (20 mg total) by mouth daily at 12 noon. Qty: 90 capsule, Refills: 0    fluticasone (FLONASE) 50 MCG/ACT nasal spray Place 1 spray into both nostrils 2 (two) times daily as needed for allergies or rhinitis. Qty: 48 g, Refills: 3   Associated Diagnoses: Acute rhinosinusitis    levothyroxine  (SYNTHROID, LEVOTHROID) 75 MCG tablet Take 1 tablet (75 mcg total) by mouth daily before breakfast. Qty: 90 tablet, Refills: 3    meloxicam (MOBIC) 7.5 MG tablet Take 1 tablet (7.5 mg total) by mouth daily. As needed for arthritic pain Qty: 30 tablet, Refills: 1    Omega-3 Fatty Acids (FISH OIL) 1000 MG CAPS Take 1 capsule by mouth daily.    spironolactone-hydrochlorothiazide (ALDACTAZIDE) 25-25 MG tablet Take 1 tablet by mouth daily as needed (high blood  pressure).    tizanidine (ZANAFLEX) 2 MG capsule Take 1 capsule (2 mg total) by mouth 3 (three) times daily as needed for muscle spasms. Qty: 60 capsule, Refills: 1   Associated Diagnoses: Osteoarthritis of lumbar spine, unspecified spinal osteoarthritis complication status    acetaminophen (TYLENOL) 325 MG tablet Take 650 mg by mouth every 4 (four) hours as needed.      STOP taking these medications     predniSONE (DELTASONE) 10 MG tablet        Allergies  Allergen Reactions  . Sulfa Antibiotics Shortness Of Breath  . Darvon [Propoxyphene] Hives   Contact information for after-discharge care    Destination    [facility] .   Specialty:  [place] information: [address] [place] [phone]               The results of significant diagnostics from this hospitalization (including imaging, microbiology, ancillary and laboratory) are listed below for reference.    Significant Diagnostic Studies: Ct Angio Head W Or Wo Contrast  Result Date: 09/18/2017 CLINICAL DATA:  Code stroke.  Found on the floor.  Disoriented. EXAM: CT ANGIOGRAPHY HEAD AND NECK CT PERFUSION BRAIN TECHNIQUE: Multidetector CT imaging of the head and neck was performed using the standard protocol during bolus administration of intravenous contrast. Multiplanar CT image reconstructions and MIPs were obtained to evaluate the vascular anatomy. Carotid stenosis measurements (when  applicable) are obtained utilizing NASCET criteria, using the distal internal carotid diameter as the denominator. Multiphase CT imaging of the brain was performed following IV bolus contrast injection. Subsequent parametric perfusion maps were calculated using RAPID software. CONTRAST:  50 cc Isovue 350 COMPARISON:  CT same day FINDINGS: CTA NECK FINDINGS Aortic arch: Atherosclerosis of the arch. No aneurysm or dissection. Branching pattern of the brachiocephalic vessels is normal without origin stenosis. Right carotid system: Common carotid artery is tortuous but widely patent to the bifurcation region. Carotid bifurcation shows atherosclerotic plaque but no stenosis or irregularity. Cervical ICA is tortuous but widely patent. Left carotid system: Common carotid artery widely patent to the bifurcation region. The vessel is tortuous. Atherosclerotic plaque at the carotid bifurcation and ICA bulb but no narrowing of the ICA beyond the diameter of the more distal cervical ICA and therefore no stenosis. Vertebral arteries: Both vertebral artery origins are widely patent. Both vertebral arteries are widely patent through the cervical region to the foramen magnum. Skeleton: Ordinary cervical spondylosis. Other neck: No mass or lymphadenopathy. Upper chest: Chronic appearing pulmonary scarring. No active process identified. Review of the MIP images confirms the above findings CTA HEAD FINDINGS Anterior circulation: Both internal carotid arteries are patent through the skullbase and siphon regions. No siphon stenosis. The anterior and middle cerebral vessels are normal without proximal stenosis, aneurysm or vascular malformation. Posterior circulation: Both vertebral arteries are patent through the foramen magnum. Mild fusiform dilatation of the left vertebral artery at the dural penetration without focal aneurysm. Both vertebral arteries widely patent to the basilar. No basilar stenosis. Posterior circulation branch  vessels are normal. Venous sinuses: Patent and normal. Anatomic variants: None significant Delayed phase: No abnormal enhancement Review of the MIP images confirms the above findings CT Brain Perfusion Findings: CBF (<30%) Volume: 33mL Perfusion (Tmax>6.0s) volume: 5mL Mismatch Volume: 79mL Infarction Location:None IMPRESSION: Normal perfusion exam. CT angiography shows ordinary mild atherosclerosis of the aortic arch in both carotid bifurcations but no stenosis or irregularity. No intracranial abnormal vascular finding. These  results were called by telephone at the time of interpretation on 09/18/2017 at 1:35 pm to Dr. Roland Rack , who verbally acknowledged these results. Electronically Signed   By: Nelson Chimes M.D.   On: 09/18/2017 13:37   Dg Shoulder Right  Result Date: 09/21/2017 CLINICAL DATA:  Rotator cuff (capsule) sprain. Fell on Wednesday. Superior right shoulder pain EXAM: RIGHT SHOULDER - 2+ VIEW COMPARISON:  None. FINDINGS: Displaced fracture of the distal right clavicle. Right humeral head appears normally positioned relative to the glenoid fossa. No fracture appreciated within the proximal right humerus or within the scapula. IMPRESSION: Displaced fracture of the distal right clavicle. Electronically Signed   By: Franki Cabot M.D.   On: 09/21/2017 14:59   Ct Angio Neck W Or Wo Contrast  Result Date: 09/18/2017 CLINICAL DATA:  Code stroke.  Found on the floor.  Disoriented. EXAM: CT ANGIOGRAPHY HEAD AND NECK CT PERFUSION BRAIN TECHNIQUE: Multidetector CT imaging of the head and neck was performed using the standard protocol during bolus administration of intravenous contrast. Multiplanar CT image reconstructions and MIPs were obtained to evaluate the vascular anatomy. Carotid stenosis measurements (when applicable) are obtained utilizing NASCET criteria, using the distal internal carotid diameter as the denominator. Multiphase CT imaging of the brain was performed following IV bolus  contrast injection. Subsequent parametric perfusion maps were calculated using RAPID software. CONTRAST:  50 cc Isovue 350 COMPARISON:  CT same day FINDINGS: CTA NECK FINDINGS Aortic arch: Atherosclerosis of the arch. No aneurysm or dissection. Branching pattern of the brachiocephalic vessels is normal without origin stenosis. Right carotid system: Common carotid artery is tortuous but widely patent to the bifurcation region. Carotid bifurcation shows atherosclerotic plaque but no stenosis or irregularity. Cervical ICA is tortuous but widely patent. Left carotid system: Common carotid artery widely patent to the bifurcation region. The vessel is tortuous. Atherosclerotic plaque at the carotid bifurcation and ICA bulb but no narrowing of the ICA beyond the diameter of the more distal cervical ICA and therefore no stenosis. Vertebral arteries: Both vertebral artery origins are widely patent. Both vertebral arteries are widely patent through the cervical region to the foramen magnum. Skeleton: Ordinary cervical spondylosis. Other neck: No mass or lymphadenopathy. Upper chest: Chronic appearing pulmonary scarring. No active process identified. Review of the MIP images confirms the above findings CTA HEAD FINDINGS Anterior circulation: Both internal carotid arteries are patent through the skullbase and siphon regions. No siphon stenosis. The anterior and middle cerebral vessels are normal without proximal stenosis, aneurysm or vascular malformation. Posterior circulation: Both vertebral arteries are patent through the foramen magnum. Mild fusiform dilatation of the left vertebral artery at the dural penetration without focal aneurysm. Both vertebral arteries widely patent to the basilar. No basilar stenosis. Posterior circulation branch vessels are normal. Venous sinuses: Patent and normal. Anatomic variants: None significant Delayed phase: No abnormal enhancement Review of the MIP images confirms the above findings CT  Brain Perfusion Findings: CBF (<30%) Volume: 72mL Perfusion (Tmax>6.0s) volume: 84mL Mismatch Volume: 24mL Infarction Location:None IMPRESSION: Normal perfusion exam. CT angiography shows ordinary mild atherosclerosis of the aortic arch in both carotid bifurcations but no stenosis or irregularity. No intracranial abnormal vascular finding. These results were called by telephone at the time of interpretation on 09/18/2017 at 1:35 pm to Dr. Roland Rack , who verbally acknowledged these results. Electronically Signed   By: Nelson Chimes M.D.   On: 09/18/2017 13:37   Ct Cervical Spine Wo Contrast  Result Date: 09/18/2017 CLINICAL DATA:  83 year old  female with acute neck pain following fall. Initial encounter. EXAM: CT CERVICAL SPINE WITHOUT CONTRAST TECHNIQUE: Multidetector CT imaging of the cervical spine was performed without intravenous contrast. Multiplanar CT image reconstructions were also generated. COMPARISON:  None. FINDINGS: Alignment: Normal. Skull base and vertebrae: No acute fracture. No primary bone lesion or focal pathologic process. Soft tissues and spinal canal: No prevertebral fluid or swelling. No visible canal hematoma. Disc levels: Moderate multilevel degenerative disc disease, spondylosis and facet arthropathy noted contributing to mild central and bony foraminal narrowing at multiple levels. Upper chest: No acute abnormality Other: None. IMPRESSION: 1. No evidence that no static evidence of acute injury to the cervical spine 2. Moderate multilevel degenerative changes. Electronically Signed   By: Margarette Canada M.D.   On: 09/18/2017 13:25   Mr Brain Wo Contrast  Result Date: 09/19/2017 CLINICAL DATA:  Seizure, new and nontraumatic. Altered mental status. EXAM: MRI HEAD WITHOUT CONTRAST TECHNIQUE: Multiplanar, multiecho pulse sequences of the brain and surrounding structures were obtained without intravenous contrast. COMPARISON:  Head CT and CTA from yesterday FINDINGS: Brain: No acute  infarction, hemorrhage, hydrocephalus, extra-axial collection or mass lesion. Age congruent cerebral volume loss and periventricular chronic microvascular ischemic change. Vascular: Major flow voids are preserved Skull and upper cervical spine: No evidence of marrow lesion. Sinuses/Orbits: No acute finding.  Bilateral cataract resection. Other: Moderate and sometimes advanced motion degradation. Gradient imaging in particular is nondiagnostic. IMPRESSION: 1. Senescent changes without acute finding. 2. Moderate to advance motion degradation. Electronically Signed   By: Monte Fantasia M.D.   On: 09/19/2017 14:13   Ct Cerebral Perfusion W Contrast  Result Date: 09/18/2017 CLINICAL DATA:  Code stroke.  Found on the floor.  Disoriented. EXAM: CT ANGIOGRAPHY HEAD AND NECK CT PERFUSION BRAIN TECHNIQUE: Multidetector CT imaging of the head and neck was performed using the standard protocol during bolus administration of intravenous contrast. Multiplanar CT image reconstructions and MIPs were obtained to evaluate the vascular anatomy. Carotid stenosis measurements (when applicable) are obtained utilizing NASCET criteria, using the distal internal carotid diameter as the denominator. Multiphase CT imaging of the brain was performed following IV bolus contrast injection. Subsequent parametric perfusion maps were calculated using RAPID software. CONTRAST:  50 cc Isovue 350 COMPARISON:  CT same day FINDINGS: CTA NECK FINDINGS Aortic arch: Atherosclerosis of the arch. No aneurysm or dissection. Branching pattern of the brachiocephalic vessels is normal without origin stenosis. Right carotid system: Common carotid artery is tortuous but widely patent to the bifurcation region. Carotid bifurcation shows atherosclerotic plaque but no stenosis or irregularity. Cervical ICA is tortuous but widely patent. Left carotid system: Common carotid artery widely patent to the bifurcation region. The vessel is tortuous. Atherosclerotic  plaque at the carotid bifurcation and ICA bulb but no narrowing of the ICA beyond the diameter of the more distal cervical ICA and therefore no stenosis. Vertebral arteries: Both vertebral artery origins are widely patent. Both vertebral arteries are widely patent through the cervical region to the foramen magnum. Skeleton: Ordinary cervical spondylosis. Other neck: No mass or lymphadenopathy. Upper chest: Chronic appearing pulmonary scarring. No active process identified. Review of the MIP images confirms the above findings CTA HEAD FINDINGS Anterior circulation: Both internal carotid arteries are patent through the skullbase and siphon regions. No siphon stenosis. The anterior and middle cerebral vessels are normal without proximal stenosis, aneurysm or vascular malformation. Posterior circulation: Both vertebral arteries are patent through the foramen magnum. Mild fusiform dilatation of the left vertebral artery at the dural  penetration without focal aneurysm. Both vertebral arteries widely patent to the basilar. No basilar stenosis. Posterior circulation branch vessels are normal. Venous sinuses: Patent and normal. Anatomic variants: None significant Delayed phase: No abnormal enhancement Review of the MIP images confirms the above findings CT Brain Perfusion Findings: CBF (<30%) Volume: 60mL Perfusion (Tmax>6.0s) volume: 52mL Mismatch Volume: 39mL Infarction Location:None IMPRESSION: Normal perfusion exam. CT angiography shows ordinary mild atherosclerosis of the aortic arch in both carotid bifurcations but no stenosis or irregularity. No intracranial abnormal vascular finding. These results were called by telephone at the time of interpretation on 09/18/2017 at 1:35 pm to Dr. Roland Rack , who verbally acknowledged these results. Electronically Signed   By: Nelson Chimes M.D.   On: 09/18/2017 13:37   Dg Chest Port 1 View  Result Date: 09/18/2017 CLINICAL DATA:  Altered mental status EXAM: PORTABLE  CHEST 1 VIEW COMPARISON:  None. FINDINGS: Heart is borderline enlarged. Mild vascular congestion. Left basilar opacity likely reflects atelectasis. No effusions or acute bony abnormality. IMPRESSION: Borderline cardiomegaly, vascular congestion. Left base atelectasis. Electronically Signed   By: Rolm Baptise M.D.   On: 09/18/2017 13:54   Dg Hip Unilat With Pelvis 2-3 Views Left  Result Date: 09/23/2017 CLINICAL DATA:  Pain following recent fall EXAM: DG HIP (WITH OR WITHOUT PELVIS) 2-3V LEFT COMPARISON:  None. FINDINGS: Frontal pelvis as well as frontal and lateral left hip images were obtained. No fracture or dislocation. Joint spaces appear normal. No erosive change. Sacroiliac joints appear unremarkable bilaterally. IMPRESSION: No fracture or dislocation.  No appreciable arthropathy. Electronically Signed   By: Lowella Grip III M.D.   On: 09/23/2017 12:07   Ct Head Code Stroke Wo Contrast  Result Date: 09/18/2017 CLINICAL DATA:  Code stroke. Found on the floor this morning. Confusion. EXAM: CT HEAD WITHOUT CONTRAST TECHNIQUE: Contiguous axial images were obtained from the base of the skull through the vertex without intravenous contrast. COMPARISON:  None. FINDINGS: Brain: Generalized atrophy. Chronic small-vessel ischemic changes of the white matter. No sign of acute infarction, mass lesion, hemorrhage, hydrocephalus or extra-axial collection. Vascular: There is atherosclerotic calcification of the major vessels at the base of the brain. Skull: Negative Sinuses/Orbits: Clear/normal Other: None ASPECTS ([place] Stroke Program Early CT Score) - Ganglionic level infarction (caudate, lentiform nuclei, internal capsule, insula, M1-M3 cortex): 7 - Supraganglionic infarction (M4-M6 cortex): 3 Total score (0-10 with 10 being normal): 10 IMPRESSION: 1. Atrophy and chronic small-vessel ischemic changes. No acute or subacute finding. 2. ASPECTS is 10. These results were called by telephone at the time of  interpretation on 09/18/2017 at 1:10 pm to Dr. Leonel Ramsay, who verbally acknowledged these results. Electronically Signed   By: Nelson Chimes M.D.   On: 09/18/2017 13:13    Microbiology: Recent Results (from the past 240 hour(s))  Urine culture     Status: Abnormal   Collection Time: 09/18/17  1:58 PM  Result Value Ref Range Status   Specimen Description URINE, CATHETERIZED  Final   Special Requests NONE  Final   Culture >=100,000 COLONIES/mL KLEBSIELLA PNEUMONIAE (A)  Final   Report Status 09/20/2017 FINAL  Final   Organism ID, Bacteria KLEBSIELLA PNEUMONIAE (A)  Final      Susceptibility   Klebsiella pneumoniae - MIC*    AMPICILLIN >=32 RESISTANT Resistant     CEFAZOLIN <=4 SENSITIVE Sensitive     CEFTRIAXONE <=1 SENSITIVE Sensitive     CIPROFLOXACIN <=0.25 SENSITIVE Sensitive     GENTAMICIN <=1 SENSITIVE Sensitive  IMIPENEM <=0.25 SENSITIVE Sensitive     NITROFURANTOIN 32 SENSITIVE Sensitive     TRIMETH/SULFA <=20 SENSITIVE Sensitive     AMPICILLIN/SULBACTAM 4 SENSITIVE Sensitive     PIP/TAZO <=4 SENSITIVE Sensitive     Extended ESBL NEGATIVE Sensitive     * >=100,000 COLONIES/mL KLEBSIELLA PNEUMONIAE  Blood Culture (routine x 2)     Status: None   Collection Time: 09/18/17  2:05 PM  Result Value Ref Range Status   Specimen Description BLOOD RIGHT ANTECUBITAL  Final   Special Requests   Final    BOTTLES DRAWN AEROBIC AND ANAEROBIC Blood Culture results may not be optimal due to an excessive volume of blood received in culture bottles   Culture NO GROWTH 5 DAYS  Final   Report Status 09/23/2017 FINAL  Final  Blood Culture (routine x 2)     Status: None   Collection Time: 09/18/17  2:30 PM  Result Value Ref Range Status   Specimen Description BLOOD LEFT FOREARM  Final   Special Requests   Final    BOTTLES DRAWN AEROBIC AND ANAEROBIC Blood Culture adequate volume   Culture NO GROWTH 5 DAYS  Final   Report Status 09/23/2017 FINAL  Final  CSF culture     Status: None    Collection Time: 09/18/17  2:35 PM  Result Value Ref Range Status   Specimen Description CSF  Final   Special Requests NONE  Final   Gram Stain   Final    WBC PRESENT, PREDOMINANTLY MONONUCLEAR NO ORGANISMS SEEN CYTOSPIN SMEAR    Culture NO GROWTH 3 DAYS  Final   Report Status 09/21/2017 FINAL  Final  Hsv Culture And Typing     Status: None   Collection Time: 09/18/17  2:35 PM  Result Value Ref Range Status   HSV Culture/Type Comment  Final    Comment: (NOTE) Negative No Herpes simplex virus isolated. Performed At: [facility] [place], [place] [id number] Lindon Romp MD YP:[id number]    Source of Sample CSF  Final  MRSA PCR Screening     Status: Abnormal   Collection Time: 09/18/17 10:39 PM  Result Value Ref Range Status   MRSA by PCR POSITIVE (A) NEGATIVE Final    Comment:        The GeneXpert MRSA Assay (FDA approved for NASAL specimens only), is one component of a comprehensive MRSA colonization surveillance program. It is not intended to diagnose MRSA infection nor to guide or monitor treatment for MRSA infections. RESULT CALLED TO, READ BACK BY AND VERIFIED WITH: D.HONEYCUTT RN 09/19/17 0238 L.CHAMPION      Labs: Basic Metabolic Panel:  Recent Labs Lab 09/18/17 1221 09/19/17 0226 09/22/17 0351 09/23/17 0919 09/24/17 0950  NA 140 139 140 139 141  K 3.4* 3.3* 2.5* 2.9* 2.8*  CL 101 101 102 103 105  CO2 29 26 26 27 28   GLUCOSE 166* 141* 131* 151* 187*  BUN 15 14 5* <5* 5*  CREATININE 0.98 1.00 0.78 0.73 0.80  CALCIUM 9.0 8.3* 8.5* 8.4* 8.4*  PHOS  --   --   --  2.6  --    Liver Function Tests:  Recent Labs Lab 09/18/17 1221 09/19/17 0226 09/23/17 0919  AST 35 53*  --   ALT 47 47  --   ALKPHOS 86 71  --   BILITOT 1.2 1.6*  --   PROT 6.6 5.9*  --   ALBUMIN 3.5 3.3* 2.8*   No results for  input(s): LIPASE, AMYLASE in the last 168 hours.  Recent Labs Lab 09/18/17 1941  AMMONIA 30   CBC:  Recent Labs Lab  09/18/17 1221 09/19/17 0226 09/20/17 0238 09/22/17 0351  WBC 22.8* 24.4* 15.0* 15.5*  NEUTROABS  --   --  11.1*  --   HGB 15.5* 13.5 12.4 13.5  HCT 46.5* 41.7 38.0 40.1  MCV 91.9 92.3 92.7 91.3  PLT 389 380 296 291   Cardiac Enzymes:  Recent Labs Lab 09/18/17 1405 09/19/17 0226  CKTOTAL 219 525*   BNP: BNP (last 3 results) No results for input(s): BNP in the last 8760 hours.  ProBNP (last 3 results) No results for input(s): PROBNP in the last 8760 hours.  CBG:  Recent Labs Lab 09/18/17 1225  GLUCAP 150*       Signed:  Nita Sells MD   [facility] 09/24/2017, 12:03 PM

## 2017-09-24 NOTE — Progress Notes (Signed)
Patient will DC to: [facility] Anticipated DC date: 09/24/17 Family notified: Son Psychiatric nurse by: Musician   Per MD patient ready for DC to [facility]. RN, patient, patient's family, and facility notified of DC. Discharge Summary sent to facility. RN given number for report [phone].   CSW signing off.  Cedric Fishman, [place] Social Worker [phone]

## 2017-09-24 NOTE — Progress Notes (Signed)
Patient has received authorization to discharge to [facility] today.  Percell Locus Sam Overbeck LCSWA [phone]

## 2017-09-24 NOTE — Progress Notes (Signed)
ammenbded d/c--patient stable for SNF at this time

## 2017-09-24 NOTE — Progress Notes (Signed)
Samantha Woods to be D/C'd Rehab, [name] per MD order.  Discussed with the patient  Son and all questions fully answered. Report given to Joy at the rehab, and all questions answered.  VSS, Skin clean, dry and intact without evidence of skin break down, no evidence of skin tears noted. IV catheter discontinued intact. Site without signs and symptoms of complications. Dressing and pressure applied.  An After Visit Summary was printed and given to the patient son. Patient son received prescription.  D/c education completed with son including follow up instructions, medication list, d/c activities limitations if indicated, with other d/c instructions as indicated by MD - patient able to verbalize understanding, all questions fully answered.   Patient instructed to return to ED, call 911, or call MD for any changes in condition.   Patient escorted via [place], and D/C rehab via private auto.  Dorris Carnes 09/24/2017 2:55 PM

## 2017-09-24 NOTE — Consult Note (Signed)
            [facility] CM Primary Care Navigator  09/24/2017  Samantha Woods 06-02-1921 [id number]    Went to seepatientat the bedside to identify possible discharge needs.  Patient reports that she had "seizure, fell and broke right collar bone". Per chart review, she was found beside her bed, was confused and very weak which resulted to this admission.  Patient endorses Ronnie Doss, DO with [place] as herprimary care provider.  [facility] [place] [facility]. Patient verbalized managing her ownmedications at home with use of "pill box" system filled once a week.   Patient reports that she had been driving prior to admission but son Rush [place]) will be providingtransportation to her doctors' appointments when discharged.  Patient mentioned that she lives with her youngest son and his family who serve as her primary caregivers at home.  Anticipated discharge plan is SNF (skilled nursing facility- [facility]) per therapy recommendation prior to returning home.  Patient expressed understanding to callprimary care provider's officewhen shereturnsback home for a post discharge follow-up appointment within a week or sooner if needed. Patient letter (with PCP's contact number) was provided as a reminder.  Explained to patientabout [facility] CM services available for health management at home but she communicated noneeds or concerns at this time. Patient voiced understanding to seek referral from primary care provider to [facility] care management as deemed necessary for services in the future.  [facility] care management information provided for future needs that shemay have.   For additional questions please contact:  Edwena Felty A. Tyrique Sporn, BSN, RN-BC [facility] PRIMARY CARE Navigator Cell: [phone]

## 2017-09-26 ENCOUNTER — Ambulatory Visit: Payer: PPO | Admitting: Family Medicine

## 2017-10-11 DIAGNOSIS — M25511 Pain in right shoulder: Secondary | ICD-10-CM | POA: Diagnosis not present

## 2017-10-11 DIAGNOSIS — Z9181 History of falling: Secondary | ICD-10-CM | POA: Diagnosis not present

## 2017-10-11 DIAGNOSIS — R262 Difficulty in walking, not elsewhere classified: Secondary | ICD-10-CM | POA: Diagnosis not present

## 2017-10-11 DIAGNOSIS — S42031D Displaced fracture of lateral end of right clavicle, subsequent encounter for fracture with routine healing: Secondary | ICD-10-CM | POA: Diagnosis not present

## 2017-10-16 ENCOUNTER — Other Ambulatory Visit: Payer: Self-pay

## 2017-10-16 NOTE — Patient Outreach (Signed)
[facility]) Care Management  10/16/2017  Janeice Stegall 1921-10-15 [id number]   Referral Date: 10/16/17 Referral Source: [facility] Date of Admission: 09/24/17 Diagnosis: Seizures Date of Discharge: 10/14/17 Facility: [place]: [facility]  Outreach attempt # 2 Spoke with son BILLY who is able to verify HIPAA.  He states that patient came home on Monday from the nursing home and has done well. He states that seizure is something new for the patient and they really don't think that was what it was. He states patient does well for her age. Discussed with son [facility] Care Management and the [facility] program. Son states that patient is not in need of that right now and declined services.  He did say that home health contacted him the other day but it was an inconvenient time and they are to call back on Friday.  We discussed home health and value of at least an evaluation.  He verbalized understanding.   Social: Patient lives in the home with her son who is her primary caregiver.  However, he states patient is independent and he just kind of oversees things with her.  Conditions: Patient has HTN, Hypothyroidism, GERD, and new seizure onset.    Medications: Patient has all her medications and son makes sure she has her medications.    Appointments: Patient does not have an appointment set with primary care physician and that it is a holiday week and busy.  He would definitely call next week to see the physician and get more medication.  He states that the nursing home gave him enough to last.  Discussed the importance of follow up exam.   Consent: RN CM reviewed [facility] services with caregiver. Caregiver declined services at this time but agreeable to receive letter and brochure.    Plan: RN CM will close case at this time and send letter and brochure.     Jone Baseman, RN, MSN [facility] Care Management Care Management Coordinator Direct Line [phone] Toll Free:  [phone]  Fax: [phone]

## 2017-10-16 NOTE — Patient Outreach (Signed)
[facility]) Care Management  10/16/2017  Zamyah Wiesman [birth year] [id number]   Telephone call to son [name] for transition of care.  Son able to verify HIPAA.  He is able to say that patient is out of [facility] and at home.  Son asked to call CM back as he is in the grocery store.  Agreed for son to call CM back.  Plan: RN CM will wait return call. If no return call will attempt again within 3 business days.   Jone Baseman, RN, MSN [facility] Care Management Care Management Coordinator Direct Line [phone] Toll Free: [phone]  Fax: [phone]

## 2017-10-20 DIAGNOSIS — Z7982 Long term (current) use of aspirin: Secondary | ICD-10-CM | POA: Diagnosis not present

## 2017-10-20 DIAGNOSIS — W19XXXD Unspecified fall, subsequent encounter: Secondary | ICD-10-CM | POA: Diagnosis not present

## 2017-10-20 DIAGNOSIS — G40909 Epilepsy, unspecified, not intractable, without status epilepticus: Secondary | ICD-10-CM | POA: Diagnosis not present

## 2017-10-20 DIAGNOSIS — Z791 Long term (current) use of non-steroidal anti-inflammatories (NSAID): Secondary | ICD-10-CM | POA: Diagnosis not present

## 2017-10-20 DIAGNOSIS — K219 Gastro-esophageal reflux disease without esophagitis: Secondary | ICD-10-CM | POA: Diagnosis not present

## 2017-10-20 DIAGNOSIS — S40011D Contusion of right shoulder, subsequent encounter: Secondary | ICD-10-CM | POA: Diagnosis not present

## 2017-10-20 DIAGNOSIS — I10 Essential (primary) hypertension: Secondary | ICD-10-CM | POA: Diagnosis not present

## 2017-10-20 DIAGNOSIS — M1712 Unilateral primary osteoarthritis, left knee: Secondary | ICD-10-CM | POA: Diagnosis not present

## 2017-10-20 DIAGNOSIS — E559 Vitamin D deficiency, unspecified: Secondary | ICD-10-CM | POA: Diagnosis not present

## 2017-10-20 DIAGNOSIS — E039 Hypothyroidism, unspecified: Secondary | ICD-10-CM | POA: Diagnosis not present

## 2017-10-20 DIAGNOSIS — S42031D Displaced fracture of lateral end of right clavicle, subsequent encounter for fracture with routine healing: Secondary | ICD-10-CM | POA: Diagnosis not present

## 2017-10-22 ENCOUNTER — Ambulatory Visit: Payer: PPO | Admitting: Family Medicine

## 2017-10-23 ENCOUNTER — Ambulatory Visit: Payer: PPO | Admitting: Family Medicine

## 2017-10-23 ENCOUNTER — Encounter: Payer: Self-pay | Admitting: Family Medicine

## 2017-10-23 VITALS — BP 126/66 | HR 76 | Temp 96.9°F | Ht 59.0 in | Wt 144.0 lb

## 2017-10-23 DIAGNOSIS — G40909 Epilepsy, unspecified, not intractable, without status epilepticus: Secondary | ICD-10-CM | POA: Diagnosis not present

## 2017-10-23 DIAGNOSIS — Z09 Encounter for follow-up examination after completed treatment for conditions other than malignant neoplasm: Secondary | ICD-10-CM

## 2017-10-23 DIAGNOSIS — E038 Other specified hypothyroidism: Secondary | ICD-10-CM

## 2017-10-23 DIAGNOSIS — M47816 Spondylosis without myelopathy or radiculopathy, lumbar region: Secondary | ICD-10-CM

## 2017-10-23 DIAGNOSIS — J019 Acute sinusitis, unspecified: Secondary | ICD-10-CM | POA: Diagnosis not present

## 2017-10-23 DIAGNOSIS — S42031A Displaced fracture of lateral end of right clavicle, initial encounter for closed fracture: Secondary | ICD-10-CM

## 2017-10-23 MED ORDER — TIZANIDINE HCL 2 MG PO CAPS: 2.0000 mg | ORAL_CAPSULE | Freq: Two times a day (BID) | ORAL | 1 refills | Status: DC | PRN

## 2017-10-23 MED ORDER — POTASSIUM CHLORIDE CRYS ER 20 MEQ PO TBCR: 20.0000 meq | EXTENDED_RELEASE_TABLET | Freq: Every day | ORAL | 0 refills | Status: DC

## 2017-10-23 MED ORDER — FUROSEMIDE 20 MG PO TABS: 10.0000 mg | ORAL_TABLET | Freq: Every day | ORAL | 0 refills | Status: DC

## 2017-10-23 MED ORDER — ESOMEPRAZOLE MAGNESIUM 20 MG PO CPDR: 20.0000 mg | DELAYED_RELEASE_CAPSULE | Freq: Every day | ORAL | 0 refills | Status: DC

## 2017-10-23 MED ORDER — LEVETIRACETAM 500 MG PO TABS: 500.0000 mg | ORAL_TABLET | Freq: Two times a day (BID) | ORAL | 1 refills | Status: DC

## 2017-10-23 MED ORDER — FLUTICASONE PROPIONATE 50 MCG/ACT NA SUSP: 1.0000 | Freq: Two times a day (BID) | NASAL | 3 refills | Status: DC | PRN

## 2017-10-23 MED ORDER — MELOXICAM 7.5 MG PO TABS: 7.5000 mg | ORAL_TABLET | Freq: Every day | ORAL | 1 refills | Status: DC

## 2017-10-23 NOTE — Patient Instructions (Addendum)
We are rechecking your thyroid function test today.  I'm also checking your potassium level and making sure that your white blood cells went back to normal.  I will contact you will the results of your labs.  If anything is abnormal, I will call you.   I have placed a referral to the neurologist for your seizure disorder.  Dr. Leonel Ramsay recommended that you see the neurologist after hospital discharge.  From his notes, he wants to keep you on the Keppra as prescribed.  I have also placed a referral to Dr. Doreatha Martin, the orthopedist that you saw in the hospital.  He recommended that you follow-up w/ him 4-6 weeks after discharge from the hospital.   Seizure, Adult A seizure is a sudden burst of abnormal electrical activity in the brain. The abnormal activity temporarily interrupts normal brain function, causing a person to experience any of the following:  Involuntary movements.  Changes in awareness or consciousness.  Uncontrollable shaking (convulsions).  Seizures usually last from 30 seconds to 2 minutes. They usually do not cause permanent brain damage unless they are prolonged. What can cause a seizure to happen? Seizures can happen for many reasons including:  A fever.  Low blood sugar.  A medicine.  An illnesses.  A brain injury.  Some people who have a seizure never have another one. People who have repeated seizures have a condition called epilepsy. What are the symptoms of a seizure? Symptoms of a seizure vary greatly from person to person. They include:  Convulsions.  Stiffening of the body.  Involuntary movements of the arms or legs.  Loss of consciousness.  Breathing problems.  Falling suddenly.  Confusion.  Head nodding.  Eye blinking or fluttering.  Lip smacking.  Drooling.  Rapid eye movements.  Grunting.  Loss of bladder control and bowel control.  Staring.  Unresponsiveness.  Some people have symptoms right before a seizure happens  (aura) and right after a seizure happens. Symptoms of an aura include:  Fear or anxiety.  Nausea.  Feeling like the room is spinning (vertigo).  A feeling of having seen or heard something before (deja vu).  Odd tastes or smells.  Changes in vision, such as seeing flashing lights or spots.  Symptoms that may follow a seizure include:  Confusion.  Sleepiness.  Headache.  Weakness of one side of the body.  Follow these instructions at home: Medicines   Take over-the-counter and prescription medicines only as told by your health care provider.  Avoid any substances that may prevent your medicine from working properly, such as alcohol. Activity  Do not drive, swim, or do any other activities that would be dangerous if you had another seizure. Wait until your health care provider approves.  If you live in the [place] check with your local DMV (department of motor vehicles) to find out about the local driving laws. Each state has specific rules about when you can legally return to driving.  Get enough rest. Lack of sleep can make seizures more likely to occur. Educating others Teach friends and family what to do if you have a seizure. They should:  Lay you on the ground to prevent a fall.  Cushion your head and body.  Loosen any tight clothing around your neck.  Turn you on your side. If vomiting occurs, this helps keep your airway clear.  Stay with you until you recover.  Not hold you down. Holding you down will not stop the seizure.  Not put anything in  your mouth.  Know whether or not you need emergency care.  General instructions  Contact your health care provider each time you have a seizure.  Avoid anything that has ever triggered a seizure for you.  Keep a seizure diary. Record what you remember about each seizure, especially anything that might have triggered the seizure.  Keep all follow-up visits as told by your health care provider. This is  important. Contact a health care provider if:  You have another seizure.  You have seizures more often.  Your seizure symptoms change.  You continue to have seizures with treatment.  You have symptoms of an infection or illness. They might increase your risk of having a seizure. Get help right away if:  You have a seizure: ? That lasts longer than 5 minutes. ? That is different than previous seizures. ? That leaves you unable to speak or use a part of your body. ? That makes it harder to breathe. ? After a head injury.  You have: ? Multiple seizures in a row. ? Confusion or a severe headache right after a seizure.  You are having seizures more often.  You do not wake up immediately after a seizure.  You injure yourself during a seizure. These symptoms may represent a serious problem that is an emergency. Do not wait to see if the symptoms will go away. Get medical help right away. Call your local emergency services (911 in the [place] Do not drive yourself to the hospital. This information is not intended to replace advice given to you by your health care provider. Make sure you discuss any questions you have with your health care provider. Document Released: 11/09/2000 Document Revised: 07/08/2016 Document Reviewed: 06/15/2016 Elsevier Interactive Patient Education  2017 [employer].

## 2017-10-23 NOTE — Progress Notes (Signed)
Subjective: CC: Hospital follow up/ SNF follow up HPI: Samantha Woods is a 83 y.o. female presenting to clinic today for:  Patient was hospitalized on September 18, 2017 after she was found down at bedside.  At that time, she was noted to be confused, weak and then had a seizure in the emergency department.  Blood pressure was noted to be elevated into the 160s/90s but this improved after repositioning of the cuff to 120s/ 90s.  Blood pressure at discharge was 140s/ 80s.  She was admitted and treated for seizure secondary to urinary tract infection.  Her blood cultures and CSF cultures were negative.  Urine culture grew Klebsiella pneumoniae sensitive to cephalosporins.  She was treated with oral antibiotics and started on Keppra by neurology.  A distal right clavicular fracture was noted.  Surgical intervention was not pursued.  Patient was to have her thyroid, CBC and BMP rechecked.  At discharge, her potassium was 2.8.  She was alert and oriented and neurologically intact.  She was to follow-up outpatient with neurology and orthopedics.  Today she presents for follow-up visit after being discharged from a short care nursing facility.  Her son reports that she was placed on Lasix 20 mg daily for blood pressure.  She was also placed on oral potassium daily.  She has not yet followed up with neurology or orthopedics.  Her son reports that he was unaware that she was supposed to do this.  She has had no recurrent seizure activity.  He does note intermittent lower extremity jerking that occurs throughout the day.  Patient reports that she feels like she is having electric-like sensation in her lower extremities at times which subsequently requires her to jerk.  Additionally, he notes that he has been giving her Tylenol arthritis 650 mg every 8 hours.  He notes that her pain seems to be well-controlled with this regimen, meloxicam as needed and Zanaflex as needed.  Patient denies dysuria, hematuria,  fevers, abdominal pain, new onset back pain.  She does report some episodes of urinary incontinence when she is not able to make it to the bathroom fast enough.  This started when she was started on Lasix.  She is currently using an adult diaper but often is able to make it to the restroom in time.  Social Hx: non smoker.  Family History  Problem Relation Age of Onset  . Heart disease Mother    Past Medical History:  Diagnosis Date  . GERD (gastroesophageal reflux disease)   . Hypertension   . Thyroid disease    Allergies  Allergen Reactions  . Sulfa Antibiotics Shortness Of Breath  . Darvon [Propoxyphene] Hives    Current Outpatient Medications:  .  acetaminophen (TYLENOL) 325 MG tablet, Take 650 mg by mouth every 4 (four) hours as needed., Disp: , Rfl:  .  aspirin EC 81 MG tablet, Take 81 mg by mouth daily., Disp: , Rfl:  .  Cholecalciferol (VITAMIN D) 2000 UNITS CAPS, Take 1 capsule by mouth., Disp: , Rfl:  .  esomeprazole (NEXIUM) 20 MG capsule, Take 1 capsule (20 mg total) by mouth daily at 12 noon., Disp: 90 capsule, Rfl: 0 .  fluticasone (FLONASE) 50 MCG/ACT nasal spray, Place 1 spray into both nostrils 2 (two) times daily as needed for allergies or rhinitis., Disp: 48 g, Rfl: 3 .  furosemide (LASIX) 20 MG tablet, Take 10 mg by mouth daily., Disp: , Rfl:  .  levETIRAcetam (KEPPRA) 500 MG tablet, Take 1 tablet (  500 mg total) by mouth 2 (two) times daily., Disp: 60 tablet, Rfl: 1 .  levothyroxine (SYNTHROID, LEVOTHROID) 75 MCG tablet, Take 1 tablet (75 mcg total) by mouth daily before breakfast., Disp: 90 tablet, Rfl: 3 .  meloxicam (MOBIC) 7.5 MG tablet, Take 1 tablet (7.5 mg total) by mouth daily. As needed for arthritic pain, Disp: 30 tablet, Rfl: 1 .  Menthol-Methyl Salicylate (MUSCLE RUB) 10-15 % CREA, Apply 1 application topically 2 (two) times daily as needed for muscle pain., Disp: , Rfl: 0 .  potassium chloride SA (K-DUR,KLOR-CON) 20 MEQ tablet, Take 20 mEq by mouth  daily., Disp: , Rfl:  .  tizanidine (ZANAFLEX) 2 MG capsule, Take 1 capsule (2 mg total) by mouth 3 (three) times daily as needed for muscle spasms., Disp: 60 capsule, Rfl: 1  ROS: Per HPI  Objective: Office vital signs reviewed. BP 126/66   Pulse 76   Temp (!) 96.9 F (36.1 C) (Oral)   Ht 4\' 11"  (1.499 m)   Wt 144 lb (65.3 kg)   BMI 29.08 kg/m   Physical Examination:  General: Awake, alert, well appearing elderly female, No acute distress HEENT: Normal, PERRLA, MMM Cardio: regular rate and rhythm, S1S2 heard, no murmurs appreciated Pulm: clear to auscultation bilaterally, no wheezes, rhonchi or rales; normal work of breathing on room air MSK: Mild tenderness along the right clavicle. Neuro: Follows all commands.  Alert and oriented.  Assessment/ Plan: 83 y.o. female   1. Seizure disorder ([place]) So far well controlled with Keppra.  I did place referral to neurology as recommended by Dr. Leonel Ramsay at discharge.  I am happy to take over her care once she is released from neurology. - Basic Metabolic Panel - CBC - Ambulatory referral to Neurology  2. Hospital discharge follow-up - Ambulatory referral to Neurology - Ambulatory referral to Orthopedic Surgery  3. Traumatic closed fracture of distal clavicle with minimal displacement, right, initial encounter Referral placed to orthopedic surgery.  Patient is no longer needing her sling.  She does note intermittent soreness over the distal aspect of the right clavicle.  This is well controlled with oral analgesics. - Ambulatory referral to Orthopedic Surgery  4. Other specified hypothyroidism - TSH  5. Acute rhinosinusitis - fluticasone (FLONASE) 50 MCG/ACT nasal spray; Place 1 spray into both nostrils 2 (two) times daily as needed for allergies or rhinitis.  Dispense: 48 g; Refill: 3  6. Osteoarthritis of lumbar spine, unspecified spinal osteoarthritis complication status - tizanidine (ZANAFLEX) 2 MG capsule; Take 1 capsule  (2 mg total) by mouth 2 (two) times daily as needed for muscle spasms. (AS NEEDED FOR BREAKTHROUGH PAIN)  Dispense: 60 capsule; Refill: 1  Follow up in 2 months  [place], [place] [phone]

## 2017-10-24 DIAGNOSIS — E559 Vitamin D deficiency, unspecified: Secondary | ICD-10-CM | POA: Diagnosis not present

## 2017-10-24 DIAGNOSIS — M1712 Unilateral primary osteoarthritis, left knee: Secondary | ICD-10-CM | POA: Diagnosis not present

## 2017-10-24 DIAGNOSIS — Z791 Long term (current) use of non-steroidal anti-inflammatories (NSAID): Secondary | ICD-10-CM | POA: Diagnosis not present

## 2017-10-24 DIAGNOSIS — K219 Gastro-esophageal reflux disease without esophagitis: Secondary | ICD-10-CM | POA: Diagnosis not present

## 2017-10-24 DIAGNOSIS — S40011D Contusion of right shoulder, subsequent encounter: Secondary | ICD-10-CM | POA: Diagnosis not present

## 2017-10-24 DIAGNOSIS — I10 Essential (primary) hypertension: Secondary | ICD-10-CM | POA: Diagnosis not present

## 2017-10-24 DIAGNOSIS — G40909 Epilepsy, unspecified, not intractable, without status epilepticus: Secondary | ICD-10-CM | POA: Diagnosis not present

## 2017-10-24 DIAGNOSIS — W19XXXD Unspecified fall, subsequent encounter: Secondary | ICD-10-CM | POA: Diagnosis not present

## 2017-10-24 DIAGNOSIS — E039 Hypothyroidism, unspecified: Secondary | ICD-10-CM | POA: Diagnosis not present

## 2017-10-24 DIAGNOSIS — S42031D Displaced fracture of lateral end of right clavicle, subsequent encounter for fracture with routine healing: Secondary | ICD-10-CM | POA: Diagnosis not present

## 2017-10-24 DIAGNOSIS — Z7982 Long term (current) use of aspirin: Secondary | ICD-10-CM | POA: Diagnosis not present

## 2017-10-24 LAB — BASIC METABOLIC PANEL
BUN/Creatinine Ratio: 11 — ABNORMAL LOW (ref 12–28)
BUN: 10 mg/dL (ref 10–36)
CO2: 27 mmol/L (ref 20–29)
CREATININE: 0.87 mg/dL (ref 0.57–1.00)
Calcium: 9.8 mg/dL (ref 8.7–10.3)
Chloride: 103 mmol/L (ref 96–106)
GFR calc Af Amer: 66 mL/min/{1.73_m2} (ref >59)
GFR calc non Af Amer: 57 mL/min/{1.73_m2} — ABNORMAL LOW (ref >59)
GLUCOSE: 93 mg/dL (ref 65–99)
Potassium: 4.3 mmol/L (ref 3.5–5.2)
Sodium: 143 mmol/L (ref 134–144)

## 2017-10-24 LAB — CBC
HEMATOCRIT: 41 % (ref 34.0–46.6)
Hemoglobin: 13.6 g/dL (ref 11.1–15.9)
MCH: 31 pg (ref 26.6–33.0)
MCHC: 33.2 g/dL (ref 31.5–35.7)
MCV: 93 fL (ref 79–97)
Platelets: 290 10*3/uL (ref 150–379)
RBC: 4.39 x10E6/uL (ref 3.77–5.28)
RDW: 15.4 % (ref 12.3–15.4)
WBC: 8.5 10*3/uL (ref 3.4–10.8)

## 2017-10-24 LAB — TSH: TSH: 2.62 u[IU]/mL (ref 0.450–4.500)

## 2017-10-25 ENCOUNTER — Telehealth: Payer: Self-pay

## 2017-10-25 NOTE — Telephone Encounter (Signed)
Son calling that [facility] called them that they have an appt in Feb with Dr Delice Lesch   He wanted Dr Leonel Ramsay ( I looked him up and it looks like he is only in the hospital)   What do you advise  Is that too long to wait  Has ortho appt in a week

## 2017-10-25 NOTE — Telephone Encounter (Signed)
I'd prefer she be seen sooner, as Dr Leonel Ramsay recommended 4-6 weeks after d/c from hospital.

## 2017-10-30 ENCOUNTER — Encounter: Payer: Self-pay | Admitting: Neurology

## 2017-10-30 ENCOUNTER — Ambulatory Visit: Payer: PPO | Admitting: Neurology

## 2017-10-30 VITALS — BP 148/66 | HR 75 | Ht 59.0 in | Wt 144.0 lb

## 2017-10-30 DIAGNOSIS — R569 Unspecified convulsions: Secondary | ICD-10-CM | POA: Diagnosis not present

## 2017-10-30 MED ORDER — LEVETIRACETAM 500 MG PO TABS: ORAL_TABLET | ORAL | 6 refills | Status: DC

## 2017-10-30 NOTE — Progress Notes (Signed)
NEUROLOGY CONSULTATION NOTE  Samantha Woods MRN: [id number] DOB: 1921-11-08  Referring provider: Dr. Adam Phenix Primary care provider: Dr. Adam Phenix  Reason for consult:  seizures  Dear Dr Lajuana Ripple:  Thank you for your kind referral of Samantha Woods for consultation of the above symptoms. Although her history is well known to you, please allow me to reiterate it for the purpose of our medical record. The patient was accompanied to the clinic by her son who also provides collateral information. Records and images were personally reviewed where available.  HISTORY OF PRESENT ILLNESS: This is a very pleasant 83 year old right-handed woman with a history of hypertension, hypothyroidism, presenting after hospitalization for seizure last 09/18/2017. She recalls she was crocheting, bent down to get something, then had no recollection of subsequent events until she was in the hospital. On further questioning, she does not remember much of her hospital stay. Her son reports she does not recall much of being in a SNF afterwards either. Her son came home that day and found her on the floor. She was not talking for more than a day. [facility] records reviewed, she was lethargic, confused, non-verbal. CT perfusion did not show any evidence of stroke. She was started on empiric antibiotics and lumbar puncture was planned. Prior to LP being performed, she had a seizure and was given 1.5mg  Ativan. She had an MRI brain which I personally reviewed, no acute changes seen. EEG showed diffuse slowing, no epileptiform discharges seen. Her LP was overall unremarkable except for an elevated protein of 62. Urine culture positive for Klebsiella. It was felt that seizure was secondary to UTI, but at age 83, seizure predisposition is likely, and further seizures could occur with future infections, hence she was discharged on Keppra 500mg  BID.   There have been no further similar spells since October. No prior  similar episodes in the past. She has been living with her son for the past 3 years, and he reports that prior to October, she had been doing pretty well, in charge of her own medications, pulling weeds in the yard. There has been a big change since return home, her son now puts her pills in a pillbox and sets a timer for her, which she does fine with. She cannot remember things for any length of time. She is still not able to walk very far, she now has to use a walker at all times (previously only used prn cane). She did break her collarbone when she fell, but denies any pain. She feels her left side is weaker. She reports that this is chronic, she has always had to lift her left leg to get into a car. She has pain in her left knee and had an injection before her hospitalization. Her son reports she is not walking like she normally did.  She has occasional sharp pains behind the eyes. She has some dizziness upon standing. There is occasional numbness in her fingertips and both feet. She used to crochet a lot, but not like before per son. He has to help her into the shower but she can bathe herself. She needs help lifting her left leg in and out of the shower. She is able to dress independently. She has some pain under the right side of her chest when combing her hair. She stopped driving at age 83. Her son is in charge of finances. She has urinary frequency, her son has been waking her up at 3am to use the bathroom,  otherwise she would wet the bed. Since hospital discharge, she has been sleeping "all the time." Her son denies any staring/unresponsive episodes. She denies any olfactory/gustatory hallucinations, deja vu, rising epigastric sensation, myoclonic jerks.  Epilepsy Risk Factors:  Her youngest sister had a seizure while pregnant. Otherwise she had a normal birth and early development.  There is no history of febrile convulsions, CNS infections such as meningitis/encephalitis, significant traumatic brain  injury, neurosurgical procedures.  PAST MEDICAL HISTORY: Past Medical History:  Diagnosis Date  . GERD (gastroesophageal reflux disease)   . Hypertension   . Thyroid disease     PAST SURGICAL HISTORY: Past Surgical History:  Procedure Laterality Date  . ABDOMINAL HYSTERECTOMY    . GALLBLADDER SURGERY      MEDICATIONS: Current Outpatient Medications on File Prior to Visit  Medication Sig Dispense Refill  . acetaminophen (TYLENOL) 325 MG tablet Take 650 mg by mouth every 4 (four) hours as needed.    Marland Kitchen aspirin EC 81 MG tablet Take 81 mg by mouth daily.    . Cholecalciferol (VITAMIN D) 2000 UNITS CAPS Take 1 capsule by mouth.    . esomeprazole (NEXIUM) 20 MG capsule Take 1 capsule (20 mg total) by mouth daily at 12 noon. 90 capsule 0  . fluticasone (FLONASE) 50 MCG/ACT nasal spray Place 1 spray into both nostrils 2 (two) times daily as needed for allergies or rhinitis. 48 g 3  . furosemide (LASIX) 20 MG tablet Take 0.5 tablets (10 mg total) by mouth daily. 45 tablet 0  . levETIRAcetam (KEPPRA) 500 MG tablet Take 1 tablet (500 mg total) by mouth 2 (two) times daily. 60 tablet 1  . levothyroxine (SYNTHROID, LEVOTHROID) 75 MCG tablet Take 1 tablet (75 mcg total) by mouth daily before breakfast. 90 tablet 3  . meloxicam (MOBIC) 7.5 MG tablet Take 1 tablet (7.5 mg total) by mouth daily. AS NEEDED for arthritic pain (use Tylenol FIRST) 30 tablet 1  . Menthol-Methyl Salicylate (MUSCLE RUB) 10-15 % CREA Apply 1 application topically 2 (two) times daily as needed for muscle pain.  0  . potassium chloride SA (K-DUR,KLOR-CON) 20 MEQ tablet Take 1 tablet (20 mEq total) by mouth daily. 90 tablet 0  . tizanidine (ZANAFLEX) 2 MG capsule Take 1 capsule (2 mg total) by mouth 2 (two) times daily as needed for muscle spasms. (AS NEEDED FOR BREAKTHROUGH PAIN) 60 capsule 1   No current facility-administered medications on file prior to visit.     ALLERGIES: Allergies  Allergen Reactions  . Sulfa  Antibiotics Shortness Of Breath  . Darvon [Propoxyphene] Hives    FAMILY HISTORY: Family History  Problem Relation Age of Onset  . Heart disease Mother     SOCIAL HISTORY: Social History   Socioeconomic History  . Marital status: Widowed    Spouse name: Not on file  . Number of children: Not on file  . Years of education: Not on file  . Highest education level: Not on file  Social Needs  . Financial resource strain: Not on file  . Food insecurity - worry: Not on file  . Food insecurity - inability: Not on file  . Transportation needs - medical: Not on file  . Transportation needs - non-medical: Not on file  Occupational History  . Not on file  Tobacco Use  . Smoking status: Never Smoker  . Smokeless tobacco: Never Used  Substance and Sexual Activity  . Alcohol use: Yes    Alcohol/week: 0.0 oz    Comment:  occasional  . Drug use: No  . Sexual activity: Not on file  Other Topics Concern  . Not on file  Social History Narrative  . Not on file    REVIEW OF SYSTEMS: Constitutional: No fevers, chills, or sweats, no generalized fatigue, change in appetite Eyes: No visual changes, double vision, eye pain Ear, nose and throat: No hearing loss, ear pain, nasal congestion, sore throat Cardiovascular: No chest pain, palpitations Respiratory:  No shortness of breath at rest or with exertion, wheezes GastrointestinaI: No nausea, vomiting, diarrhea, abdominal pain, fecal incontinence Genitourinary:  No dysuria, urinary retention , + frequency Musculoskeletal:  No neck pain, back pain Integumentary: No rash, pruritus, skin lesions Neurological: as above Psychiatric: No depression, insomnia, anxiety Endocrine: No palpitations, fatigue, diaphoresis, mood swings, change in appetite, change in weight, increased thirst Hematologic/Lymphatic:  No anemia, purpura, petechiae. Allergic/Immunologic: no itchy/runny eyes, nasal congestion, recent allergic reactions, rashes  PHYSICAL  EXAM: Vitals:   10/30/17 0911  BP: (!) 148/66  Pulse: 75  SpO2: 96%   General: No acute distress, sitting in wheelchair Head:  Normocephalic/atraumatic Eyes: Fundoscopic exam shows bilateral sharp discs, no vessel changes, exudates, or hemorrhages Neck: supple, no paraspinal tenderness, full range of motion Back: No paraspinal tenderness Heart: regular rate and rhythm Lungs: Clear to auscultation bilaterally. Vascular: No carotid bruits. Skin/Extremities: No rash, no edema Neurological Exam: Mental status: alert and oriented to person, place, and time, no dysarthria or aphasia, Fund of knowledge is appropriate.  Recent and remote memory are impaired.  Attention and concentration are normal.    Able to name objects and repeat phrases. CDT 5/5 MMSE - Mini Mental State Exam 10/30/2017  Orientation to time 5  Orientation to Place 4  Registration 3  Attention/ Calculation 1  Recall 2  Language- name 2 objects 2  Language- repeat 1  Language- follow 3 step command 3  Language- read & follow direction 1  Write a sentence 1  Copy design 1  Total score 24   Cranial nerves: CN I: not tested CN II: pupils equal, round and reactive to light, visual fields intact, fundi unremarkable. CN III, IV, VI:  full range of motion, no nystagmus, no ptosis CN V: facial sensation intact CN VII: upper and lower face symmetric CN VIII: hearing intact to finger rub CN IX, X: gag intact, uvula midline CN XI: sternocleidomastoid and trapezius muscles intact CN XII: tongue midline Bulk & Tone: normal, no fasciculations. Motor: 5/5 throughout with no pronator drift. Sensation: intact to light touch, cold, pin on both UE, decreased cold, pin on left LE, intact vibration and joint position sense.  No extinction to double simultaneous stimulation.   Deep Tendon Reflexes: +2 throughout, no ankle clonus Plantar responses: downgoing bilaterally Cerebellar: no incoordination on finger to nose Gait: not  tested Tremor: none  IMPRESSION: This is a 83 year old right-handed woman with a history of hypertension, hypothyroidism, presenting after hospitalization for new onset seizure last 09/18/2017. She was confused at that time, in the setting of a UTI. MRI brain no acute changes, EEG showed diffuse slowing. LP unremarkable except for mildly elevated protein. Etiology of seizure unclear, potentially due to underlying infection, however it was felt that she had a risk for recurrent seizures and was started on Keppra. She is drowsy on current dose and will reduce to 250mg  in AM, 500mg  in PM. She continues to have cognitive changes since hospitalization, MMSE today 24/30. Continue 24/7 care. They will discuss concerns about urinary frequency  and Lasix with her PCP. She does not drive. Follow-up in 4-5 months, they know to call for any changes.   Thank you for allowing me to participate in the care of this patient. Please do not hesitate to call for any questions or concerns.   Ellouise Newer, M.D.  CC: Dr. Lajuana Ripple

## 2017-10-30 NOTE — Patient Instructions (Addendum)
1. Reduce Levetiracetam 500mg : Take 1/2 tablet in AM, 1 tablet in PM 2. Discuss Lasix and frequent urination with Dr. Lajuana Ripple 3. Follow-up in 4-5 months, call for any changes  Seizure Precautions: 1. If medication has been prescribed for you to prevent seizures, take it exactly as directed.  Do not stop taking the medicine without talking to your doctor first, even if you have not had a seizure in a long time.   2. Avoid activities in which a seizure would cause danger to yourself or to others.  Don't operate dangerous machinery, swim alone, or climb in high or dangerous places, such as on ladders, roofs, or girders.  Do not drive unless your doctor says you may.  3. If you have any warning that you may have a seizure, lay down in a safe place where you can't hurt yourself.    4.  No driving for 6 months from last seizure, as per [facility].   Please refer to the following link on the [place] website for more information: http://[email or web address].org/[email or web address]/driving/drivingu.cfm   5.  Maintain good sleep hygiene. Avoid alcohol  6.  Contact your doctor if you have any problems that may be related to the medicine you are taking.  7.  Call 911 and bring the patient back to the ED if:        A.  The seizure lasts longer than 5 minutes.       B.  The patient doesn't awaken shortly after the seizure  C.  The patient has new problems such as difficulty seeing, speaking or moving  D.  The patient was injured during the seizure  E.  The patient has a temperature over 102 F (39C)  F.  The patient vomited and now is having trouble breathing

## 2017-11-12 DIAGNOSIS — S42031D Displaced fracture of lateral end of right clavicle, subsequent encounter for fracture with routine healing: Secondary | ICD-10-CM | POA: Diagnosis not present

## 2017-12-05 NOTE — Progress Notes (Signed)
Subjective: CC: 2 month follow up HPI: Samantha Woods is a 84 y.o. female presenting to clinic today for:  1. Seizure disorder Patient was seen by neurology last month.  Because of excessive sedation, her morning dose of Keppra was reduced to 250 mg.  She is continued on the 500 mg of Keppra each night.  Patient denies seizure activity.  She does note she continues to feel somewhat tired with medication.  Otherwise seems to be doing well.  She has follow-up on March 8 with neurology.  2. Frequent urination Patient started on Lasix when she was in the hospital.  She notes increased urination after initiation of this medication.  However, she reports today that urination has been much more tolerable.  She does continue to wake up once or twice per night but does not feel like it is as noticeable as when she started the medication.  She does not wish to discontinue.  She denies lower extremity edema.  Blood pressure has been well controlled on current regimen.  3.  Nasal congestion Patient reports substantial nasal congestion each morning when she wakes up.  She notes that her mucus seems thick and difficult to get out.  She has been holding a warm compress over her nose and breathing in efforts to loosen the mucus, she notes that this does help.  She does report increased frontal sinus pressure.  Denies fevers, chills, nausea, vomiting.  No purulence from nares.  She has been using her Flonase twice a day as directed.  4.  Fatigue Patient notes that she feels generalized tiredness and often will stay in bed.  She notes associated weakness.  Her son who is present notes that she does not often get up and get around.  Patient notes that her generalized arthralgias are worse with prolonged laying down and when she is aching, she will get out of bed which improves pain.  ROS: Per HPI  Past Medical History:  Diagnosis Date  . GERD (gastroesophageal reflux disease)   . Hypertension   . Thyroid  disease    Allergies  Allergen Reactions  . Sulfa Antibiotics Shortness Of Breath  . Darvon [Propoxyphene] Hives    Current Outpatient Medications:  .  acetaminophen (TYLENOL) 325 MG tablet, Take 650 mg by mouth every 4 (four) hours as needed., Disp: , Rfl:  .  aspirin EC 81 MG tablet, Take 81 mg by mouth daily., Disp: , Rfl:  .  Cholecalciferol (VITAMIN D) 2000 UNITS CAPS, Take 1 capsule by mouth., Disp: , Rfl:  .  esomeprazole (NEXIUM) 20 MG capsule, Take 1 capsule (20 mg total) by mouth daily at 12 noon., Disp: 90 capsule, Rfl: 0 .  fluticasone (FLONASE) 50 MCG/ACT nasal spray, Place 1 spray into both nostrils 2 (two) times daily as needed for allergies or rhinitis., Disp: 48 g, Rfl: 3 .  furosemide (LASIX) 20 MG tablet, Take 0.5 tablets (10 mg total) by mouth daily., Disp: 45 tablet, Rfl: 0 .  levETIRAcetam (KEPPRA) 500 MG tablet, Take 1/2 tablet in AM, 1 tablet in PM, Disp: 45 tablet, Rfl: 6 .  levothyroxine (SYNTHROID, LEVOTHROID) 75 MCG tablet, Take 1 tablet (75 mcg total) by mouth daily before breakfast., Disp: 90 tablet, Rfl: 3 .  meloxicam (MOBIC) 7.5 MG tablet, Take 1 tablet (7.5 mg total) by mouth daily. AS NEEDED for arthritic pain (use Tylenol FIRST), Disp: 30 tablet, Rfl: 1 .  Menthol-Methyl Salicylate (MUSCLE RUB) 10-15 % CREA, Apply 1 application topically 2 (two)  times daily as needed for muscle pain., Disp: , Rfl: 0 .  potassium chloride SA (K-DUR,KLOR-CON) 20 MEQ tablet, Take 1 tablet (20 mEq total) by mouth daily., Disp: 90 tablet, Rfl: 0 .  tizanidine (ZANAFLEX) 2 MG capsule, Take 1 capsule (2 mg total) by mouth 2 (two) times daily as needed for muscle spasms. (AS NEEDED FOR BREAKTHROUGH PAIN), Disp: 60 capsule, Rfl: 1 Social History   Socioeconomic History  . Marital status: Widowed    Spouse name: Not on file  . Number of children: Not on file  . Years of education: Not on file  . Highest education level: Not on file  Social Needs  . Financial resource strain:  Not on file  . Food insecurity - worry: Not on file  . Food insecurity - inability: Not on file  . Transportation needs - medical: Not on file  . Transportation needs - non-medical: Not on file  Occupational History  . Not on file  Tobacco Use  . Smoking status: Never Smoker  . Smokeless tobacco: Never Used  Substance and Sexual Activity  . Alcohol use: Yes    Alcohol/week: 0.0 oz    Comment: occasional  . Drug use: No  . Sexual activity: Not on file  Other Topics Concern  . Not on file  Social History Narrative   Pt lives in a 1 story home with her son and his wife   Has 3 adult children   High School Graduate   Retired from [employer]    Family History  Problem Relation Age of Onset  . Heart disease Mother     Objective: Office vital signs reviewed. BP 137/69   Pulse 70   Temp (!) 96.5 F (35.8 C) (Oral)   Ht 4\' 11"  (1.499 m)   Wt 143 lb (64.9 kg)   BMI 28.88 kg/m   Physical Examination:  General: Awake, alert, frail appearing elderly female, No acute distress HEENT: Normal    Neck: No masses palpated. No lymphadenopathy    Eyes: PERRLA, extraocular movement in tact, sclera white    Nose: nasal turbinates moist, clear nasal discharge    Throat: moist mucus membranes, no erythema, no tonsillar exudate.  Airway is patent Cardio: regular rate and rhythm, S1S2 heard, no murmurs appreciated Pulm: clear to auscultation bilaterally, no wheezes, rhonchi or rales; normal work of breathing on room air Extremities: warm, well perfused, No edema, cyanosis or clubbing; +2 pulses bilaterally MSK: Uses rolling walker for ambulation  Assessment/ Plan: 84 y.o. female   1. Essential hypertension Well-controlled with Lasix.  Check potassium and renal function today.  Lasix and potassium were refilled today. - Basic Metabolic Panel  2. Musculoskeletal pain I recommended the patient continue to be mobile as much as possible.  She may schedule Tylenol every 8 hours.   We discussed silver sneakers and possible referral to physical therapy for generalized deconditioning.  Son will work on getting her more mobile at home.  3. Seizure disorder ([place]) Currently under the care of Dr.Aquino.  Patient has follow-up in March.  Continue Keppra twice daily as directed.  4. Frontal sinusitis, unspecified chronicity Recommended nasal saline spray several times daily.  Continue Flonase.  Could consider using a humidifier in the house.  No evidence of infection today.  This is likely secondary to changes in weather and increased central heating use.  5. Physical deconditioning See above  6. Fatigue, unspecified type May be secondary to generalized deconditioning.  However, will rule out  metabolic causes.  BMP, vitamin B12, CBC and TSH were ordered today. - Basic Metabolic Panel - Vitamin G50 - CBC with Differential - TSH  Follow-up in 3 months for routine care.  Janora Norlander, DO [place] [phone]

## 2017-12-11 ENCOUNTER — Encounter: Payer: Self-pay | Admitting: Family Medicine

## 2017-12-11 ENCOUNTER — Ambulatory Visit (INDEPENDENT_AMBULATORY_CARE_PROVIDER_SITE_OTHER): Payer: PPO | Admitting: Family Medicine

## 2017-12-11 VITALS — BP 137/69 | HR 70 | Temp 96.5°F | Ht 59.0 in | Wt 143.0 lb

## 2017-12-11 DIAGNOSIS — G40909 Epilepsy, unspecified, not intractable, without status epilepticus: Secondary | ICD-10-CM | POA: Diagnosis not present

## 2017-12-11 DIAGNOSIS — J321 Chronic frontal sinusitis: Secondary | ICD-10-CM | POA: Diagnosis not present

## 2017-12-11 DIAGNOSIS — R5383 Other fatigue: Secondary | ICD-10-CM | POA: Diagnosis not present

## 2017-12-11 DIAGNOSIS — I10 Essential (primary) hypertension: Secondary | ICD-10-CM

## 2017-12-11 DIAGNOSIS — R5381 Other malaise: Secondary | ICD-10-CM | POA: Diagnosis not present

## 2017-12-11 DIAGNOSIS — M7918 Myalgia, other site: Secondary | ICD-10-CM | POA: Diagnosis not present

## 2017-12-11 MED ORDER — ESOMEPRAZOLE MAGNESIUM 20 MG PO CPDR: 20.0000 mg | DELAYED_RELEASE_CAPSULE | Freq: Every day | ORAL | 0 refills | Status: DC

## 2017-12-11 MED ORDER — POTASSIUM CHLORIDE CRYS ER 20 MEQ PO TBCR: 20.0000 meq | EXTENDED_RELEASE_TABLET | Freq: Every day | ORAL | 0 refills | Status: DC

## 2017-12-11 MED ORDER — FUROSEMIDE 20 MG PO TABS: 10.0000 mg | ORAL_TABLET | Freq: Every day | ORAL | 0 refills | Status: DC

## 2017-12-11 NOTE — Patient Instructions (Addendum)
As we discussed, I highly recommend that she maintain physical activity so as to reduce chances of weakness and recurrent falls.  I am happy to refer you guys to the physical therapy clinic next door if this would be most beneficial.  However, you could consider enrolling her in "Silver sneakers"at the [employer] to improve physical activity.  I do recommend that you schedule her dental appointment as soon as possible for dentures, especially since her teeth seem to be her barrier to eating whole some meals.  For her sinusitis/nasal congestion, continue the Flonase nasal spray.  She may also add nasal saline spray, which can be found over-the-counter at the pharmacy department.  She may use this several times a day if needed for dry sinuses.  We will check her kidney function, potassium level, B12, and thyroid level today.  Will contact you with the results once they are available.  Her neurologist name is Dr. Delice Lesch.  She wanted you to follow-up in about 4 months.

## 2017-12-12 LAB — CBC WITH DIFFERENTIAL/PLATELET
BASOS: 1 %
Basophils Absolute: 0.1 10*3/uL (ref 0.0–0.2)
EOS (ABSOLUTE): 0.3 10*3/uL (ref 0.0–0.4)
EOS: 4 %
HEMATOCRIT: 42.6 % (ref 34.0–46.6)
Hemoglobin: 13.6 g/dL (ref 11.1–15.9)
IMMATURE GRANS (ABS): 0 10*3/uL (ref 0.0–0.1)
Immature Granulocytes: 0 %
Lymphocytes Absolute: 2.4 10*3/uL (ref 0.7–3.1)
Lymphs: 33 %
MCH: 29.9 pg (ref 26.6–33.0)
MCHC: 31.9 g/dL (ref 31.5–35.7)
MCV: 94 fL (ref 79–97)
MONOS ABS: 0.8 10*3/uL (ref 0.1–0.9)
Monocytes: 11 %
Neutrophils Absolute: 3.7 10*3/uL (ref 1.4–7.0)
Neutrophils: 51 %
PLATELETS: 273 10*3/uL (ref 150–379)
RBC: 4.55 x10E6/uL (ref 3.77–5.28)
RDW: 13.1 % (ref 12.3–15.4)
WBC: 7.3 10*3/uL (ref 3.4–10.8)

## 2017-12-12 LAB — BASIC METABOLIC PANEL
BUN / CREAT RATIO: 15 (ref 12–28)
BUN: 13 mg/dL (ref 10–36)
CHLORIDE: 103 mmol/L (ref 96–106)
CO2: 24 mmol/L (ref 20–29)
Calcium: 9.7 mg/dL (ref 8.7–10.3)
Creatinine, Ser: 0.87 mg/dL (ref 0.57–1.00)
GFR calc non Af Amer: 57 mL/min/{1.73_m2} — ABNORMAL LOW (ref >59)
GFR, EST AFRICAN AMERICAN: 66 mL/min/{1.73_m2} (ref >59)
Glucose: 98 mg/dL (ref 65–99)
POTASSIUM: 4.3 mmol/L (ref 3.5–5.2)
SODIUM: 143 mmol/L (ref 134–144)

## 2017-12-12 LAB — VITAMIN B12: Vitamin B-12: 414 pg/mL (ref 232–1245)

## 2017-12-12 LAB — TSH: TSH: 2.46 u[IU]/mL (ref 0.450–4.500)

## 2018-01-08 ENCOUNTER — Other Ambulatory Visit: Payer: Self-pay | Admitting: Family Medicine

## 2018-01-15 ENCOUNTER — Ambulatory Visit: Payer: Self-pay | Admitting: Neurology

## 2018-01-31 ENCOUNTER — Other Ambulatory Visit: Payer: PPO

## 2018-01-31 ENCOUNTER — Encounter: Payer: Self-pay | Admitting: Neurology

## 2018-01-31 ENCOUNTER — Ambulatory Visit (INDEPENDENT_AMBULATORY_CARE_PROVIDER_SITE_OTHER): Payer: PPO | Admitting: Neurology

## 2018-01-31 VITALS — BP 110/52 | HR 83

## 2018-01-31 DIAGNOSIS — R350 Frequency of micturition: Secondary | ICD-10-CM

## 2018-01-31 DIAGNOSIS — R569 Unspecified convulsions: Secondary | ICD-10-CM | POA: Diagnosis not present

## 2018-01-31 MED ORDER — LEVETIRACETAM 500 MG PO TABS: ORAL_TABLET | ORAL | 11 refills | Status: DC

## 2018-01-31 NOTE — Patient Instructions (Addendum)
1. Decrease Levetiracetam 500mg : take 1/2 tablet twice a day 2. Urinalysis with reflex culture today 3. Call Dr. Lajuana Ripple for an earlier appointment for congestion 4. Follow-up in 6 months, call for any changes  Seizure Precautions: 1. If medication has been prescribed for you to prevent seizures, take it exactly as directed.  Do not stop taking the medicine without talking to your doctor first, even if you have not had a seizure in a long time.   2. Avoid activities in which a seizure would cause danger to yourself or to others.  Don't operate dangerous machinery, swim alone, or climb in high or dangerous places, such as on ladders, roofs, or girders.  Do not drive unless your doctor says you may.  3. If you have any warning that you may have a seizure, lay down in a safe place where you can't hurt yourself.    4.  No driving for 6 months from last seizure, as per [facility].   Please refer to the following link on the [place] website for more information: http://[email or web address].org/[email or web address]/driving/drivingu.cfm   5.  Maintain good sleep hygiene. Avoid alcohol.  6.  Contact your doctor if you have any problems that may be related to the medicine you are taking.  7.  Call 911 and bring the patient back to the ED if:        A.  The seizure lasts longer than 5 minutes.       B.  The patient doesn't awaken shortly after the seizure  C.  The patient has new problems such as difficulty seeing, speaking or moving  D.  The patient was injured during the seizure  E.  The patient has a temperature over 102 F (39C)  F.  The patient vomited and now is having trouble breathing

## 2018-01-31 NOTE — Progress Notes (Signed)
NEUROLOGY FOLLOW UP OFFICE NOTE  Samantha Woods [id number] 1921-11-09  HISTORY OF PRESENT ILLNESS: I had the pleasure of seeing Samantha Woods in follow-up in the neurology clinic on 01/31/2018.  The patient was last seen 3 months ago for new onset seizure last October 2018. She is again accompanied by her son who helps supplement the history today. She was discharged home on Keppra, her son reported a big change since hospitalization. Keppra dose was reduced to 250mg  in AM, 500mg  in PM. No further seizures since October 2018. Her son reports that she has real good days, but some days complains her head is hurting her. She has a lot of nasal congestion and expectorates and blows her nose a lot. She is much better after using a nasal spray. She feels the congestion in her head and ears makes her head "feel crazy." Sometimes she gets so weak that she cannot crochet. She has still not been the same since the seizure, she is slower. Her son is concerned that yesterday she did not look well. She seemed fine at 1am on the computer. Then at 4am, she called her son on his phone even if she was just one room away in the same house, she could hardly walk and needed help to the bathroom (at baseline she can go to bathroom independently). She called him again on the phone at [place], they went to the bathroom, then 20 minutes later she again said she needed to use the bathroom. He was not sure she could make this appointment, but she dressed herself. She denies any burning on urination, but has some hesitancy in addition to frequency. Her son denies any staring/unresponsive episodes, no focal numbness/tingling/weakness, no myoclonic jerks.  HPI 10/30/2017: This is a very pleasant 84 yo RH woman with a history of hypertension, hypothyroidism, with new onset seizure last 09/18/2017. She recalls she was crocheting, bent down to get something, then had no recollection of subsequent events until she was in the hospital. On  further questioning, she does not remember much of her hospital stay. Her son reports she does not recall much of being in a SNF afterwards either. Her son came home that day and found her on the floor. She was not talking for more than a day. [facility] records reviewed, she was lethargic, confused, non-verbal. CT perfusion did not show any evidence of stroke. She was started on empiric antibiotics and lumbar puncture was planned. Prior to LP being performed, she had a seizure and was given 1.5mg  Ativan. She had an MRI brain which I personally reviewed, no acute changes seen. EEG showed diffuse slowing, no epileptiform discharges seen. Her LP was overall unremarkable except for an elevated protein of 62. Urine culture positive for Klebsiella. It was felt that seizure was secondary to UTI, but at age 84, seizure predisposition is likely, and further seizures could occur with future infections, hence she was discharged on Keppra 500mg  BID.   There have been no further similar spells since October. No prior similar episodes in the past. She has been living with her son for the past 3 years, and he reports that prior to October, she had been doing pretty well, in charge of her own medications, pulling weeds in the yard. There has been a big change since return home, her son now puts her pills in a pillbox and sets a timer for her, which she does fine with. She cannot remember things for any length of time. She is still not  able to walk very far, she now has to use a walker at all times (previously only used prn cane). She did break her collarbone when she fell, but denies any pain. She feels her left side is weaker. She reports that this is chronic, she has always had to lift her left leg to get into a car. She has pain in her left knee and had an injection before her hospitalization. Her son reports she is not walking like she normally did.  She has occasional sharp pains behind the eyes. She has some dizziness upon  standing. There is occasional numbness in her fingertips and both feet. She used to crochet a lot, but not like before per son. He has to help her into the shower but she can bathe herself. She needs help lifting her left leg in and out of the shower. She is able to dress independently. She has some pain under the right side of her chest when combing her hair. She stopped driving at age 84. Her son is in charge of finances. She has urinary frequency, her son has been waking her up at 3am to use the bathroom, otherwise she would wet the bed. Since hospital discharge, she has been sleeping "all the time." Her son denies any staring/unresponsive episodes. She denies any olfactory/gustatory hallucinations, deja vu, rising epigastric sensation, myoclonic jerks.  Epilepsy Risk Factors:  Her youngest sister had a seizure while pregnant. Otherwise she had a normal birth and early development.  There is no history of febrile convulsions, CNS infections such as meningitis/encephalitis, significant traumatic brain injury, neurosurgical procedures.  PAST MEDICAL HISTORY: Past Medical History:  Diagnosis Date  . GERD (gastroesophageal reflux disease)   . Hypertension   . Thyroid disease     MEDICATIONS: Current Outpatient Medications on File Prior to Visit  Medication Sig Dispense Refill  . acetaminophen (TYLENOL) 325 MG tablet Take 650 mg by mouth every 4 (four) hours as needed.    Marland Kitchen aspirin EC 81 MG tablet Take 81 mg by mouth daily.    . Cholecalciferol (VITAMIN D) 2000 UNITS CAPS Take 1 capsule by mouth.    . esomeprazole (NEXIUM) 20 MG capsule Take 1 capsule (20 mg total) by mouth daily at 12 noon. 90 capsule 0  . fluticasone (FLONASE) 50 MCG/ACT nasal spray Place 1 spray into both nostrils 2 (two) times daily as needed for allergies or rhinitis. 48 g 3  . furosemide (LASIX) 20 MG tablet Take 0.5 tablets (10 mg total) by mouth daily. 45 tablet 0  . levETIRAcetam (KEPPRA) 500 MG tablet Take 1/2 tablet in  AM, 1 tablet in PM 45 tablet 6  . levothyroxine (SYNTHROID, LEVOTHROID) 75 MCG tablet Take 1 tablet (75 mcg total) by mouth daily before breakfast. 90 tablet 3  . meloxicam (MOBIC) 7.5 MG tablet TAKE 1 TABLET BY MOUTH ONCE DAILY AS NEEDED FOR  ARTHRITIC  PAIN  (USE  TYLENOL  FIRST) 30 tablet 1  . Menthol-Methyl Salicylate (MUSCLE RUB) 10-15 % CREA Apply 1 application topically 2 (two) times daily as needed for muscle pain.  0  . potassium chloride SA (K-DUR,KLOR-CON) 20 MEQ tablet Take 1 tablet (20 mEq total) by mouth daily. 90 tablet 0  . tizanidine (ZANAFLEX) 2 MG capsule Take 1 capsule (2 mg total) by mouth 2 (two) times daily as needed for muscle spasms. (AS NEEDED FOR BREAKTHROUGH PAIN) 60 capsule 1   No current facility-administered medications on file prior to visit.     ALLERGIES:  Allergies  Allergen Reactions  . Sulfa Antibiotics Shortness Of Breath  . Darvon [Propoxyphene] Hives    FAMILY HISTORY: Family History  Problem Relation Age of Onset  . Heart disease Mother     SOCIAL HISTORY: Social History   Socioeconomic History  . Marital status: Widowed    Spouse name: Not on file  . Number of children: Not on file  . Years of education: Not on file  . Highest education level: Not on file  Social Needs  . Financial resource strain: Not on file  . Food insecurity - worry: Not on file  . Food insecurity - inability: Not on file  . Transportation needs - medical: Not on file  . Transportation needs - non-medical: Not on file  Occupational History  . Not on file  Tobacco Use  . Smoking status: Never Smoker  . Smokeless tobacco: Never Used  Substance and Sexual Activity  . Alcohol use: Yes    Alcohol/week: 0.0 oz    Comment: occasional  . Drug use: No  . Sexual activity: Not on file  Other Topics Concern  . Not on file  Social History Narrative   Pt lives in a 1 story home with her son and his wife   Has 3 adult children   High School Graduate   Retired from  [employer]: Constitutional: No fevers, chills, or sweats, no generalized fatigue, change in appetite Eyes: No visual changes, double vision, eye pain Ear, nose and throat: No hearing loss, ear pain, nasal congestion, sore throat Cardiovascular: No chest pain, palpitations Respiratory:  No shortness of breath at rest or with exertion, wheezes GastrointestinaI: No nausea, vomiting, diarrhea, abdominal pain, fecal incontinence Genitourinary:  No dysuria, +urinary frequency Musculoskeletal:  No neck pain, back pain Integumentary: No rash, pruritus, skin lesions Neurological: as above Psychiatric: No depression, insomnia, anxiety Endocrine: No palpitations, fatigue, diaphoresis, mood swings, change in appetite, change in weight, increased thirst Hematologic/Lymphatic:  No anemia, purpura, petechiae. Allergic/Immunologic: no itchy/runny eyes, nasal congestion, recent allergic reactions, rashes  PHYSICAL EXAM: Vitals:   01/31/18 1325  BP: (!) 110/52  Pulse: 83  SpO2: 96%   General: No acute distress Head:  Normocephalic/atraumatic Neck: supple, no paraspinal tenderness, full range of motion Heart:  Regular rate and rhythm Lungs:  Clear to auscultation bilaterally Back: No paraspinal tenderness Skin/Extremities: No rash, no edema Neurological Exam: alert and oriented to person, place, and time. No aphasia or dysarthria. Fund of knowledge is appropriate.  Recent and remote memory are impaired.  Attention and concentration are normal.    Able to name objects and repeat phrases. Cranial nerves: Pupils equal, round, reactive to light. Extraocular movements intact with no nystagmus. Visual fields full. Facial sensation intact. No facial asymmetry. Tongue, uvula, palate midline.  Motor: Bulk and tone normal, muscle strength 5/5 throughout with no pronator drift.  Sensation to light touch intact.  No extinction to double simultaneous stimulation.  Deep tendon  reflexes 2+ throughout, toes downgoing.  Finger to nose testing intact.  Gait not tested, on wheelchair.  IMPRESSION: This is a 84 yo RH woman with a history of hypertension, hypothyroidism, with new onset seizure last 09/18/2017. She was confused at that time, in the setting of a UTI, then had a witnessed GTC in the hospital. MRI brain no acute changes, EEG showed diffuse slowing. LP unremarkable except for mildly elevated protein. Etiology of seizure unclear, potentially due to underlying infection, however it was felt  that she had a risk for recurrent seizures and was started on Keppra. No seizures since October 2018. Her son still feels she is slower since the seizure, we discussed reducing Keppra dose further to 250mg  BID. He is worried she is starting to have another UTI with the new onset urinary frequency and some hesitancy. A urinalysis with reflex culture will be sent today. They will call her PCP regarding nasal congestion. Continue 24/7 care. She does not drive. Follow-up in 6 months, they know to call for any changes.  Thank you for allowing me to participate in her care.  Please do not hesitate to call for any questions or concerns.  The duration of this appointment visit was 25 minutes of face-to-face time with the patient.  Greater than 50% of this time was spent in counseling, explanation of diagnosis, planning of further management, and coordination of care.   Ellouise Newer, M.D.   CC: Dr. Lajuana Ripple

## 2018-02-01 ENCOUNTER — Other Ambulatory Visit: Payer: Self-pay | Admitting: Neurology

## 2018-02-01 ENCOUNTER — Telehealth: Payer: Self-pay | Admitting: Neurology

## 2018-02-01 ENCOUNTER — Encounter: Payer: Self-pay | Admitting: Neurology

## 2018-02-01 MED ORDER — CEPHALEXIN 500 MG PO TABS: 500.0000 mg | ORAL_TABLET | Freq: Three times a day (TID) | ORAL | 0 refills | Status: AC

## 2018-02-01 NOTE — Telephone Encounter (Signed)
Reviewed urinalysis results, allergies, prior creatinine. Urine culture pending. Called son on both numbers listed that would start antibiotic for UTI, Cephalexin 500mg  TID x 7 days. Will forward message to his PCP if Dr. Lajuana Ripple would like to make any changes on Monday.

## 2018-02-02 LAB — URINALYSIS W MICROSCOPIC + REFLEX CULTURE
BILIRUBIN URINE: NEGATIVE
Bacteria, UA: NONE SEEN /HPF
GLUCOSE, UA: NEGATIVE
HGB URINE DIPSTICK: NEGATIVE
Hyaline Cast: NONE SEEN /LPF
Ketones, ur: NEGATIVE
NITRITES URINE, INITIAL: NEGATIVE
PROTEIN: NEGATIVE
Specific Gravity, Urine: 1.022 (ref 1.001–1.03)
pH: 5.5 (ref 5.0–8.0)

## 2018-02-02 LAB — URINE CULTURE
MICRO NUMBER:: 90304770
SPECIMEN QUALITY: ADEQUATE

## 2018-02-02 LAB — CULTURE INDICATED

## 2018-02-19 ENCOUNTER — Emergency Department (HOSPITAL_COMMUNITY): Payer: PPO

## 2018-02-19 ENCOUNTER — Inpatient Hospital Stay (HOSPITAL_COMMUNITY)
Admission: EM | Admit: 2018-02-19 | Discharge: 2018-02-24 | DRG: 100 | Disposition: A | Discharge status: Skilled Nursing Facility | Payer: PPO | Attending: Internal Medicine | Admitting: Internal Medicine

## 2018-02-19 ENCOUNTER — Other Ambulatory Visit: Payer: Self-pay

## 2018-02-19 ENCOUNTER — Encounter (HOSPITAL_COMMUNITY): Payer: Self-pay

## 2018-02-19 DIAGNOSIS — R569 Unspecified convulsions: Secondary | ICD-10-CM

## 2018-02-19 DIAGNOSIS — J690 Pneumonitis due to inhalation of food and vomit: Secondary | ICD-10-CM | POA: Diagnosis present

## 2018-02-19 DIAGNOSIS — G8384 Todd's paralysis (postepileptic): Secondary | ICD-10-CM | POA: Diagnosis present

## 2018-02-19 DIAGNOSIS — R918 Other nonspecific abnormal finding of lung field: Secondary | ICD-10-CM | POA: Diagnosis not present

## 2018-02-19 DIAGNOSIS — Z79899 Other long term (current) drug therapy: Secondary | ICD-10-CM | POA: Diagnosis not present

## 2018-02-19 DIAGNOSIS — Z888 Allergy status to other drugs, medicaments and biological substances status: Secondary | ICD-10-CM | POA: Diagnosis not present

## 2018-02-19 DIAGNOSIS — S0081XA Abrasion of other part of head, initial encounter: Secondary | ICD-10-CM | POA: Diagnosis not present

## 2018-02-19 DIAGNOSIS — Z791 Long term (current) use of non-steroidal anti-inflammatories (NSAID): Secondary | ICD-10-CM | POA: Diagnosis not present

## 2018-02-19 DIAGNOSIS — S79912A Unspecified injury of left hip, initial encounter: Secondary | ICD-10-CM | POA: Diagnosis not present

## 2018-02-19 DIAGNOSIS — R7303 Prediabetes: Secondary | ICD-10-CM | POA: Diagnosis present

## 2018-02-19 DIAGNOSIS — Z66 Do not resuscitate: Secondary | ICD-10-CM | POA: Diagnosis present

## 2018-02-19 DIAGNOSIS — W19XXXA Unspecified fall, initial encounter: Secondary | ICD-10-CM | POA: Diagnosis present

## 2018-02-19 DIAGNOSIS — I10 Essential (primary) hypertension: Secondary | ICD-10-CM | POA: Diagnosis present

## 2018-02-19 DIAGNOSIS — K219 Gastro-esophageal reflux disease without esophagitis: Secondary | ICD-10-CM | POA: Diagnosis present

## 2018-02-19 DIAGNOSIS — E876 Hypokalemia: Secondary | ICD-10-CM | POA: Diagnosis present

## 2018-02-19 DIAGNOSIS — G9340 Encephalopathy, unspecified: Secondary | ICD-10-CM

## 2018-02-19 DIAGNOSIS — R531 Weakness: Secondary | ICD-10-CM

## 2018-02-19 DIAGNOSIS — Z90710 Acquired absence of both cervix and uterus: Secondary | ICD-10-CM

## 2018-02-19 DIAGNOSIS — M25469 Effusion, unspecified knee: Secondary | ICD-10-CM

## 2018-02-19 DIAGNOSIS — Z885 Allergy status to narcotic agent status: Secondary | ICD-10-CM

## 2018-02-19 DIAGNOSIS — Z882 Allergy status to sulfonamides status: Secondary | ICD-10-CM

## 2018-02-19 DIAGNOSIS — Z7982 Long term (current) use of aspirin: Secondary | ICD-10-CM | POA: Diagnosis not present

## 2018-02-19 DIAGNOSIS — J9811 Atelectasis: Secondary | ICD-10-CM | POA: Diagnosis present

## 2018-02-19 DIAGNOSIS — E039 Hypothyroidism, unspecified: Secondary | ICD-10-CM | POA: Diagnosis present

## 2018-02-19 DIAGNOSIS — E860 Dehydration: Secondary | ICD-10-CM | POA: Diagnosis present

## 2018-02-19 DIAGNOSIS — R51 Headache: Secondary | ICD-10-CM | POA: Diagnosis not present

## 2018-02-19 DIAGNOSIS — G40901 Epilepsy, unspecified, not intractable, with status epilepticus: Secondary | ICD-10-CM | POA: Diagnosis not present

## 2018-02-19 DIAGNOSIS — G8194 Hemiplegia, unspecified affecting left nondominant side: Secondary | ICD-10-CM | POA: Diagnosis present

## 2018-02-19 DIAGNOSIS — R4020 Unspecified coma: Secondary | ICD-10-CM | POA: Diagnosis not present

## 2018-02-19 DIAGNOSIS — R296 Repeated falls: Secondary | ICD-10-CM | POA: Diagnosis present

## 2018-02-19 DIAGNOSIS — R4182 Altered mental status, unspecified: Secondary | ICD-10-CM | POA: Diagnosis not present

## 2018-02-19 DIAGNOSIS — S79911A Unspecified injury of right hip, initial encounter: Secondary | ICD-10-CM | POA: Diagnosis not present

## 2018-02-19 DIAGNOSIS — M1712 Unilateral primary osteoarthritis, left knee: Secondary | ICD-10-CM | POA: Diagnosis not present

## 2018-02-19 DIAGNOSIS — R29818 Other symptoms and signs involving the nervous system: Secondary | ICD-10-CM | POA: Diagnosis not present

## 2018-02-19 DIAGNOSIS — M542 Cervicalgia: Secondary | ICD-10-CM | POA: Diagnosis not present

## 2018-02-19 DIAGNOSIS — R402441 Other coma, without documented Glasgow coma scale score, or with partial score reported, in the field [EMT or ambulance]: Secondary | ICD-10-CM | POA: Diagnosis not present

## 2018-02-19 HISTORY — DX: Unspecified convulsions: R56.9

## 2018-02-19 LAB — URINALYSIS, ROUTINE W REFLEX MICROSCOPIC
BACTERIA UA: NONE SEEN
Bilirubin Urine: NEGATIVE
GLUCOSE, UA: 150 mg/dL — AB
KETONES UR: 5 mg/dL — AB
LEUKOCYTES UA: NEGATIVE
NITRITE: NEGATIVE
PROTEIN: 100 mg/dL — AB
SQUAMOUS EPITHELIAL / LPF: NONE SEEN
Specific Gravity, Urine: 1.011 (ref 1.005–1.030)
pH: 7 (ref 5.0–8.0)

## 2018-02-19 LAB — DIFFERENTIAL
BASOS PCT: 0 %
Basophils Absolute: 0 10*3/uL (ref 0.0–0.1)
EOS ABS: 0 10*3/uL (ref 0.0–0.7)
EOS PCT: 0 %
LYMPHS PCT: 11 %
Lymphs Abs: 1.4 10*3/uL (ref 0.7–4.0)
MONO ABS: 0.6 10*3/uL (ref 0.1–1.0)
Monocytes Relative: 5 %
NEUTROS PCT: 84 %
Neutro Abs: 10.1 10*3/uL — ABNORMAL HIGH (ref 1.7–7.7)

## 2018-02-19 LAB — ETHANOL: Alcohol, Ethyl (B): <10 mg/dL (ref <10)

## 2018-02-19 LAB — HEMOGLOBIN A1C
Hgb A1c MFr Bld: 5.4 % (ref 4.8–5.6)
Mean Plasma Glucose: 108.28 mg/dL

## 2018-02-19 LAB — CBC
HCT: 45.7 % (ref 36.0–46.0)
Hemoglobin: 15.2 g/dL — ABNORMAL HIGH (ref 12.0–15.0)
MCH: 31 pg (ref 26.0–34.0)
MCHC: 33.3 g/dL (ref 30.0–36.0)
MCV: 93.1 fL (ref 78.0–100.0)
Platelets: 271 10*3/uL (ref 150–400)
RBC: 4.91 MIL/uL (ref 3.87–5.11)
RDW: 14.6 % (ref 11.5–15.5)
WBC: 12.2 10*3/uL — ABNORMAL HIGH (ref 4.0–10.5)

## 2018-02-19 LAB — PROTIME-INR
INR: 1.05
Prothrombin Time: 13.6 seconds (ref 11.4–15.2)

## 2018-02-19 LAB — COMPREHENSIVE METABOLIC PANEL
ALK PHOS: 155 U/L — AB (ref 38–126)
ALT: 20 U/L (ref 14–54)
ANION GAP: 15 (ref 5–15)
AST: 41 U/L (ref 15–41)
Albumin: 4.1 g/dL (ref 3.5–5.0)
BUN: 14 mg/dL (ref 6–20)
CALCIUM: 9.6 mg/dL (ref 8.9–10.3)
CO2: 21 mmol/L — AB (ref 22–32)
Chloride: 103 mmol/L (ref 101–111)
Creatinine, Ser: 0.92 mg/dL (ref 0.44–1.00)
GFR calc non Af Amer: 51 mL/min — ABNORMAL LOW (ref >60)
GFR, EST AFRICAN AMERICAN: 59 mL/min — AB (ref >60)
Glucose, Bld: 199 mg/dL — ABNORMAL HIGH (ref 65–99)
POTASSIUM: 4 mmol/L (ref 3.5–5.1)
SODIUM: 139 mmol/L (ref 135–145)
TOTAL PROTEIN: 7.2 g/dL (ref 6.5–8.1)
Total Bilirubin: 1 mg/dL (ref 0.3–1.2)

## 2018-02-19 LAB — CK TOTAL AND CKMB (NOT AT ARMC)
CK TOTAL: 61 U/L (ref 38–234)
CK, MB: 2 ng/mL (ref 0.5–5.0)
Relative Index: INVALID (ref 0.0–2.5)

## 2018-02-19 LAB — I-STAT CHEM 8, ED
BUN: 19 mg/dL (ref 6–20)
CALCIUM ION: 1.09 mmol/L — AB (ref 1.15–1.40)
CREATININE: 0.8 mg/dL (ref 0.44–1.00)
Chloride: 102 mmol/L (ref 101–111)
GLUCOSE: 198 mg/dL — AB (ref 65–99)
HCT: 47 % — ABNORMAL HIGH (ref 36.0–46.0)
HEMOGLOBIN: 16 g/dL — AB (ref 12.0–15.0)
Potassium: 4 mmol/L (ref 3.5–5.1)
Sodium: 141 mmol/L (ref 135–145)
TCO2: 24 mmol/L (ref 22–32)

## 2018-02-19 LAB — RAPID URINE DRUG SCREEN, HOSP PERFORMED
Amphetamines: NOT DETECTED
Barbiturates: NOT DETECTED
Benzodiazepines: NOT DETECTED
COCAINE: NOT DETECTED
OPIATES: NOT DETECTED
Tetrahydrocannabinol: NOT DETECTED

## 2018-02-19 LAB — I-STAT TROPONIN, ED: Troponin i, poc: 0.01 ng/mL (ref 0.00–0.08)

## 2018-02-19 MED ORDER — LEVOTHYROXINE SODIUM 75 MCG PO TABS
75.0000 ug | ORAL_TABLET | Freq: Every day | ORAL | Status: DC
  Administered 2018-02-20 – 2018-02-24 (×5): 75 ug via ORAL
  Filled 2018-02-19 (×5): qty 1

## 2018-02-19 MED ORDER — ONDANSETRON HCL 4 MG/2ML IJ SOLN
INTRAMUSCULAR | Status: AC
  Administered 2018-02-19: 4 mg via INTRAVENOUS
  Filled 2018-02-19: qty 2

## 2018-02-19 MED ORDER — ACETAMINOPHEN 650 MG RE SUPP: 650.0000 mg | Freq: Four times a day (QID) | RECTAL | Status: DC | PRN

## 2018-02-19 MED ORDER — LORAZEPAM 2 MG/ML IJ SOLN
INTRAMUSCULAR | Status: AC
  Administered 2018-02-19: 1 mg
  Filled 2018-02-19: qty 1

## 2018-02-19 MED ORDER — SODIUM CHLORIDE 0.9 % IV SOLN
INTRAVENOUS | Status: DC
  Administered 2018-02-19 – 2018-02-23 (×6): via INTRAVENOUS

## 2018-02-19 MED ORDER — HALOPERIDOL LACTATE 5 MG/ML IJ SOLN
5.0000 mg | Freq: Four times a day (QID) | INTRAMUSCULAR | Status: DC | PRN
  Administered 2018-02-19: 5 mg via INTRAVENOUS
  Filled 2018-02-19: qty 1

## 2018-02-19 MED ORDER — ONDANSETRON HCL 4 MG/2ML IJ SOLN: 4.0000 mg | Freq: Four times a day (QID) | INTRAMUSCULAR | Status: DC | PRN

## 2018-02-19 MED ORDER — ONDANSETRON HCL 4 MG/2ML IJ SOLN
4.0000 mg | Freq: Once | INTRAMUSCULAR | Status: AC
  Administered 2018-02-19: 4 mg via INTRAVENOUS

## 2018-02-19 MED ORDER — ENOXAPARIN SODIUM 40 MG/0.4ML ~~LOC~~ SOLN
40.0000 mg | SUBCUTANEOUS | Status: DC
  Administered 2018-02-19 – 2018-02-23 (×5): 40 mg via SUBCUTANEOUS
  Filled 2018-02-19 (×5): qty 0.4

## 2018-02-19 MED ORDER — LEVETIRACETAM IN NACL 1000 MG/100ML IV SOLN
1000.0000 mg | Freq: Once | INTRAVENOUS | Status: AC
  Administered 2018-02-19: 1000 mg via INTRAVENOUS
  Filled 2018-02-19: qty 100

## 2018-02-19 MED ORDER — LEVETIRACETAM IN NACL 500 MG/100ML IV SOLN
500.0000 mg | Freq: Two times a day (BID) | INTRAVENOUS | Status: DC
  Administered 2018-02-19 – 2018-02-23 (×8): 500 mg via INTRAVENOUS
  Filled 2018-02-19 (×9): qty 100

## 2018-02-19 MED ORDER — ACETAMINOPHEN 325 MG PO TABS
650.0000 mg | ORAL_TABLET | Freq: Four times a day (QID) | ORAL | Status: DC | PRN
  Administered 2018-02-23: 650 mg via ORAL
  Filled 2018-02-19: qty 2

## 2018-02-19 MED ORDER — HALOPERIDOL LACTATE 5 MG/ML IJ SOLN
2.0000 mg | Freq: Four times a day (QID) | INTRAMUSCULAR | Status: DC | PRN
  Administered 2018-02-19: 2 mg via INTRAVENOUS
  Filled 2018-02-19: qty 1

## 2018-02-19 MED ORDER — ONDANSETRON HCL 4 MG PO TABS: 4.0000 mg | ORAL_TABLET | Freq: Four times a day (QID) | ORAL | Status: DC | PRN

## 2018-02-19 MED ORDER — IOPAMIDOL (ISOVUE-370) INJECTION 76%
INTRAVENOUS | Status: AC
  Filled 2018-02-19: qty 100

## 2018-02-19 MED ORDER — PANTOPRAZOLE SODIUM 40 MG PO TBEC
40.0000 mg | DELAYED_RELEASE_TABLET | Freq: Every day | ORAL | Status: DC
  Administered 2018-02-20 – 2018-02-24 (×5): 40 mg via ORAL
  Filled 2018-02-19 (×5): qty 1

## 2018-02-19 MED ORDER — POLYETHYLENE GLYCOL 3350 17 G PO PACK: 17.0000 g | PACK | Freq: Every day | ORAL | Status: DC | PRN

## 2018-02-19 NOTE — ED Notes (Signed)
Pt LVO positive on this RN assessment. EDP notified. Code Stroke LVO positive paged out. Pt actively vomiting, concerned about pt laying flat for ct, vo zofran IV administered, stroke team at bedside, pt taken to CT.

## 2018-02-19 NOTE — Progress Notes (Signed)
Samantha Woods [id number]  Code Status: DNR  Admission Data: 02/19/2018 8:10 PM  Attending Provider: Lorin Mercy  [id number], Koleen Distance, DO  Consults/ Treatment Team:   [name] is a 84 y.o. female patient admitted from ED. Responds to voice but unable to assess orientation status. Patient very restless, mittens in place. VSS - Blood pressure (!) 116/53, pulse 94, temperature (!) 97.5 F (36.4 C), resp. rate (!) 22, weight 64.9 kg (143 lb), SpO2 97 %. no c/o shortness of breath, no c/o chest pain. Cardiac tele # [id number], in place. Orientation to room, and floor completed with information packet given to patient/family. Admission INP armband ID verified and in place.  Call light within reach. Bed alarm on and side rails padded. Skin, clean-dry- intact without evidence of bruising, or skin tears.  No evidence of skin break down noted on exam.  ?  Will cont to eval and treat per MD orders.  Melonie Florida, RN  02/19/2018 8:10 PM

## 2018-02-19 NOTE — ED Notes (Signed)
Patient agitated and moving hands around. Patient redirected by family member however that only works for a minutes at a time.

## 2018-02-19 NOTE — ED Notes (Signed)
Pt not cooperating for CT, 1 mg IV Ativan administered per VO neurologist.

## 2018-02-19 NOTE — ED Notes (Signed)
While in CT pt began moving all extremities and verbally communicating with staff, pt remains deviated and confused.  Neurologist aware of change in condition.

## 2018-02-19 NOTE — ED Provider Notes (Signed)
[facility] EMERGENCY DEPARTMENT Provider Note   CSN: [id number] Arrival date & time: 02/19/18  3536     History   Chief Complaint Chief Complaint  Patient presents with  . Altered Mental Status    HPI Samantha Woods is a 84 y.o. female. 5 caveat secondary to patient being unable to give history and acuity of condition HPI 84 year old female presents via EMS from home.  EMS reports they were called out secondary to altered mental status.  They report there was a female and the home who appeared to be her son.  EMS reports last known normal at midnight last night.  They report there is a possible fall between 6 and 7 AM.  They report that she has had her eyes open but has not been responding to them in route.  Blood sugar was 214.  There is reports that she has recently had a seizure and has been started on Keppra. Past Medical History:  Diagnosis Date  . GERD (gastroesophageal reflux disease)   . Hypertension   . Thyroid disease     Patient Active Problem List   Diagnosis Date Noted  . Traumatic closed fracture of distal clavicle with minimal displacement, right, initial encounter 09/22/2017  . Unwitnessed fall 09/22/2017  . Seizure disorder ([place]) 09/18/2017  . Musculoskeletal pain 09/18/2017  . GERD (gastroesophageal reflux disease) 09/18/2017  . Cystocele, midline 08/30/2017  . Tricompartmental disease of knee 08/30/2017  . Hypothyroidism 01/23/2015  . Gastroesophageal reflux disease with esophagitis 01/23/2015  . Essential hypertension 01/23/2015  . Degenerative arthritis of spine 01/23/2015  . Prediabetes 01/23/2015    Past Surgical History:  Procedure Laterality Date  . ABDOMINAL HYSTERECTOMY    . GALLBLADDER SURGERY       OB History    Gravida  3   Para  3   Term      Preterm      AB      Living        SAB      TAB      Ectopic      Multiple      Live Births               Home Medications    Prior to Admission  medications   Medication Sig Start Date End Date Taking? Authorizing Provider  acetaminophen (TYLENOL) 325 MG tablet Take 650 mg by mouth every 4 (four) hours as needed.    [provider]  aspirin EC 81 MG tablet Take 81 mg by mouth daily.    [provider]  Cholecalciferol (VITAMIN D) 2000 UNITS CAPS Take 1 capsule by mouth.    [provider]  esomeprazole (NEXIUM) 20 MG capsule Take 1 capsule (20 mg total) by mouth daily at 12 noon. 12/11/17   Janora Norlander, DO  fluticasone (FLONASE) 50 MCG/ACT nasal spray Place 1 spray into both nostrils 2 (two) times daily as needed for allergies or rhinitis. 10/23/17   Janora Norlander, DO  furosemide (LASIX) 20 MG tablet Take 0.5 tablets (10 mg total) by mouth daily. 12/11/17   Janora Norlander, DO  levETIRAcetam (KEPPRA) 500 MG tablet Take 1/2 tablet twice a day 01/31/18   Cameron Sprang, MD  levothyroxine (SYNTHROID, LEVOTHROID) 75 MCG tablet Take 1 tablet (75 mcg total) by mouth daily before breakfast. 08/30/17   Ronnie Doss M, DO  meloxicam (MOBIC) 7.5 MG tablet TAKE 1 TABLET BY MOUTH ONCE DAILY AS NEEDED FOR  ARTHRITIC  PAIN  (USE  TYLENOL  FIRST) 01/09/18   Janora Norlander, DO  Menthol-Methyl Salicylate (MUSCLE RUB) 10-15 % CREA Apply 1 application topically 2 (two) times daily as needed for muscle pain. 09/24/17   Nita Sells, MD  potassium chloride SA (K-DUR,KLOR-CON) 20 MEQ tablet Take 1 tablet (20 mEq total) by mouth daily. 12/11/17   Janora Norlander, DO  tizanidine (ZANAFLEX) 2 MG capsule Take 1 capsule (2 mg total) by mouth 2 (two) times daily as needed for muscle spasms. (AS NEEDED FOR BREAKTHROUGH PAIN) 10/23/17   Janora Norlander, DO    Family History Family History  Problem Relation Age of Onset  . Heart disease Mother     Social History Social History   Tobacco Use  . Smoking status: Never Smoker  . Smokeless tobacco: Never Used  Substance Use Topics  . Alcohol use: Yes      Alcohol/week: 0.0 oz    Comment: occasional  . Drug use: No     Allergies   Sulfa antibiotics and Darvon [propoxyphene]   Review of Systems Review of Systems  Unable to perform ROS: Acuity of condition     Physical Exam Updated Vital Signs There were no vitals taken for this visit.  Physical Exam  Constitutional: She appears well-developed and well-nourished.  HENT:  Right Ear: External ear normal.  Left Ear: External ear normal.  Nose: Nose normal.  Abrasion to left face Unable to examine times patient will not open mouth  Eyes: Right eye exhibits no discharge.  Gaze deviated to right with right pupil slightly larger than left pupil and pupils are reactive  Pulmonary/Chest: Effort normal.  Abdominal: Soft.  Musculoskeletal:  Left arm is held flexed  Neurological:  Patient has eyes open, but continues to gaze to the right does not past midline and has intermittently seemed to follow commands but other times has not She appears to be able to move her right arm and right leg.  The left arm has tone and she does move some resistance although otherwise appears Right biceps tendon 3+ left biceps tendon 1+ Right patellar reflex 3+, left patellar reflex 2+ Patient withdraws right foot No movement left foot  Nursing note and vitals reviewed.    ED Treatments / Results  Labs (all labs ordered are listed, but only abnormal results are displayed) Labs Reviewed - No data to display  EKG None  Radiology No results found.  Procedures Procedures (including critical care time)  Medications Ordered in ED Medications - No data to display   Initial Impression / Assessment and Plan / ED Course  I have reviewed the triage vital signs and the nursing notes.  Pertinent labs & imaging results that were available during my care of the patient were reviewed by me and considered in my medical decision making (see chart for details).     8:54 AM RN reports patient now  moving left side 9:01 AM Stroke team at bedside.  Patient is moving left upper extremity and left lower extremity with some movement Patient has had nausea and vomited and is receiving Zofran prior to going to CT 10:36 AM Vitals:   02/19/18 0945 02/19/18 1000  BP: (!) 152/76 (!) 159/94  Pulse: 66 95  Resp: (!) 23 (!) 22  Temp:    SpO2: 96% 97%   Stroke vs seizure with paralysis and prolonged postictal phase Less likely- Infection  Patient given iv ativan for agitation in ct scanner and loaded with  keppra per neurology She appears to move all 4 extremities but remains sleepy and confused- unclear now if from original event or medication induced She will likely need step down bed  Discussed with Grafton Folk and she will see for admission Final Clinical Impressions(s) / ED Diagnoses   Final diagnoses:  Altered mental status, unspecified altered mental status type  Left-sided weakness    ED Discharge Orders    None       Pattricia Boss, MD 02/19/18 1129

## 2018-02-19 NOTE — Code Documentation (Signed)
84 yo female coming from home with [facility]. Pt was found down on the floor this morning with altered mental status. Water was noted to be running in the bathroom and patient was found face down. EMS was called and transported to ED. RN was evaluating patient and noted left sided weakness with right eye deviation and patient was unable to talk. Code Stroke LVO positive called, and Stroke Team at the bedside. Pt noted to drowsy, unable to follow commands or answer questions, partial gaze palsy to the right, Left arm weakness, right arm drift, left leg and right leg weakness, Mute, and severe dysarthria. Family arrived and reported hx of an episode similar where pt has hx of seizures. Pt had dose of seizure medication decreased recently. Pt at baseline is alert and oriented, walking, and talking. Outside of window for tPA. Handoff given to [place], Therapist, sports.

## 2018-02-19 NOTE — ED Notes (Signed)
Patient NPO, No Diet was ordered for Lunch.

## 2018-02-19 NOTE — ED Notes (Signed)
MD called re: Patient continues to pull off cables and attempts to get out of bed. Family member has been opne on one, sitter now with patient. Mitts applied.

## 2018-02-19 NOTE — H&P (Signed)
History and Physical    Samantha Woods:[id number] DOB: 1921/02/02 DOA: 02/19/2018  PCP: Janora Norlander, DO Consultants:  Neurology Patient coming from: Home - lives with son ; NOK: [name] [phone]  Chief Complaint: Unwitnessed fall  HPI: Samantha Woods is a 84 y.o. female with medical history significant of seizure newly dx in 09/2017 admission (she was found to have Klebsiella UTI at the time), hypertension, hypothyroidism, GERD. Pt presented to ED today after being found on bedroom floor by son. Unable to obtain any hx from pt as she received IV Ativan for being combative in CT on arrival. Son states he heard pt get up to use the restroom around 0400. At [place] his wife woke up to find water running in the bathroom and found pt on floor of bedroom, face down and with heavy breathing. Per ED notes, pt nonverbal on arrival with right sided gaze and left hemiparesis. These symptoms improved prior to receiving Ativan. On my assessment, she remains nonverbal and pushing me off when I attempt to examine. No focal deficits noted, but very difficult to assess. Of note, Keppra dose recently decreased on 01/31/18 by neurologist because son mentioned she might be more tired than usual since being on medication.    ED Course: CT with no acute findings and chronic changes r/t age. Again, IV Ativan given bc pt was combative in CT. Pt has been given IV Keppra loading dose per neurology recs  Review of Systems: As per HPI; otherwise review of systems reviewed and negative.   Ambulatory Status: ambulates with walker prior to admit  Past Medical History:  Diagnosis Date  . GERD (gastroesophageal reflux disease)   . Hypertension   . Seizure ([place])   . Thyroid disease     Past Surgical History:  Procedure Laterality Date  . ABDOMINAL HYSTERECTOMY    . GALLBLADDER SURGERY      Social History   Socioeconomic History  . Marital status: Widowed    Spouse name: Not on file  . Number  of children: Not on file  . Years of education: Not on file  . Highest education level: Not on file  Occupational History  . Not on file  Social Needs  . Financial resource strain: Not on file  . Food insecurity:    Worry: Not on file    Inability: Not on file  . Transportation needs:    Medical: Not on file    Non-medical: Not on file  Tobacco Use  . Smoking status: Never Smoker  . Smokeless tobacco: Never Used  Substance and Sexual Activity  . Alcohol use: Yes    Alcohol/week: 0.0 oz    Comment: occasional  . Drug use: No  . Sexual activity: Not on file  Lifestyle  . Physical activity:    Days per week: Not on file    Minutes per session: Not on file  . Stress: Not on file  Relationships  . Social connections:    Talks on phone: Not on file    Gets together: Not on file    Attends religious service: Not on file    Active member of club or organization: Not on file    Attends meetings of clubs or organizations: Not on file    Relationship status: Not on file  . Intimate partner violence:    Fear of current or ex partner: Not on file    Emotionally abused: Not on file    Physically abused: Not on file  Forced sexual activity: Not on file  Other Topics Concern  . Not on file  Social History Narrative   Pt lives in a 1 story home with her son and his wife   Has 3 adult children   Programmer, systems   Retired from [employer]  . Sulfa Antibiotics Shortness Of Breath  . Lorazepam Other (See Comments)    Restless, "fidgety"  . Darvon [Propoxyphene] Hives    Family History  Problem Relation Age of Onset  . Heart disease Mother     Prior to Admission medications   Medication Sig Start Date End Date Taking? Authorizing Provider  acetaminophen (TYLENOL) 325 MG tablet Take 650 mg by mouth every 4 (four) hours as needed for mild pain.    Yes [provider]  aspirin EC 81 MG tablet Take 81 mg by mouth  daily.   Yes [provider]  Cholecalciferol (VITAMIN D) 2000 UNITS CAPS Take 1 capsule by mouth.   Yes [provider]  docusate sodium (PHILLIPS STOOL SOFTENER) 100 MG capsule Take 100 mg by mouth daily as needed for mild constipation.   Yes [provider]  esomeprazole (NEXIUM) 20 MG capsule Take 1 capsule (20 mg total) by mouth daily at 12 noon. 12/11/17  Yes Gottschalk, Ashly M, DO  fluticasone (FLONASE) 50 MCG/ACT nasal spray Place 1 spray into both nostrils 2 (two) times daily as needed for allergies or rhinitis. 10/23/17  Yes Gottschalk, Leatrice Jewels M, DO  furosemide (LASIX) 20 MG tablet Take 0.5 tablets (10 mg total) by mouth daily. 12/11/17  Yes Gottschalk, Ashly M, DO  levETIRAcetam (KEPPRA) 500 MG tablet Take 1/2 tablet twice a day Patient taking differently: Take 250 mg by mouth 2 (two) times daily.  01/31/18  Yes Cameron Sprang, MD  levothyroxine (SYNTHROID, LEVOTHROID) 75 MCG tablet Take 1 tablet (75 mcg total) by mouth daily before breakfast. 08/30/17  Yes Gottschalk, Ashly M, DO  meloxicam (MOBIC) 7.5 MG tablet TAKE 1 TABLET BY MOUTH ONCE DAILY AS NEEDED FOR  ARTHRITIC  PAIN  (USE  TYLENOL  FIRST) Patient taking differently: 7.5mg  by mouth every morning 01/09/18  Yes Gottschalk, Ashly M, DO  Menthol-Methyl Salicylate (MUSCLE RUB) 10-15 % CREA Apply 1 application topically 2 (two) times daily as needed for muscle pain. 09/24/17  Yes Nita Sells, MD  potassium chloride SA (K-DUR,KLOR-CON) 20 MEQ tablet Take 1 tablet (20 mEq total) by mouth daily. 12/11/17  Yes Gottschalk, Leatrice Jewels M, DO  tizanidine (ZANAFLEX) 2 MG capsule Take 1 capsule (2 mg total) by mouth 2 (two) times daily as needed for muscle spasms. (AS NEEDED FOR BREAKTHROUGH PAIN) Patient taking differently: Take 2 mg by mouth at bedtime. (AS NEEDED FOR BREAKTHROUGH PAIN) 10/23/17  Yes Janora Norlander, DO    Physical Exam: Vitals:   02/19/18 1115 02/19/18 1130 02/19/18 1215 02/19/18 1230  BP: (!)  145/134 (!) 129/59 140/78 (!) 162/149  Pulse: 82 75 91 94  Resp: 19 (!) 22 (!) 24 (!) 21  Temp:      TempSrc:      SpO2: 100% 100% 99% 96%  Weight:        Very limited exam as pt has received ativan  General: frail, elderly female lying on side in NAD, but difficult to arouse. Pushing me away when attempting to assess Eyes: unable to assess ENT: unable to assess Neck: no LAD, masses or thyromegaly; no carotid bruits Cardiovascular: S1S2 RRR,  no m/r/g. No LE edema.  Respiratory:  Decreased effor, CTA bilaterally with no wheezes/rales/rhonchi.   Abdomen: soft, seemingly NT, ND, NABS Back:  normal alignment Skin: no rash or induration seen on limited exam Musculoskeletal:  no bony abnormality seen Lower extremity: No LE edema.  Limited foot exam with no ulcerations.  2+ distal pulses. Psychiatric: unable to assess Neurologic: unable to assess   Radiological Exams on Admission: Dg Chest Port 1 View  Result Date: 02/19/2018 CLINICAL DATA:  Altered mental status. EXAM: PORTABLE CHEST 1 VIEW COMPARISON:  Radiograph of September 18, 2017. FINDINGS: Stable cardiomegaly. Atherosclerosis of thoracic aorta is noted. No pneumothorax is noted. Right lung is clear. Left basilar atelectasis and pleural effusion is noted. Old distal right clavicular fracture is noted. IMPRESSION: Mild left basilar opacity is noted concerning for atelectasis with associated pleural effusion. Aortic Atherosclerosis (ICD10-I70.0). Electronically Signed   By: Marijo Conception, M.D.   On: 02/19/2018 10:38   Ct Head Code Stroke Wo Contrast  Result Date: 02/19/2018 CLINICAL DATA:  Code stroke.  Altered level of consciousness. EXAM: CT HEAD WITHOUT CONTRAST TECHNIQUE: Contiguous axial images were obtained from the base of the skull through the vertex without intravenous contrast. COMPARISON:  09/18/2017 FINDINGS: Brain: No evidence of acute infarction, hemorrhage, hydrocephalus, extra-axial collection or mass lesion/mass effect.  Mild atrophy and chronic small vessel ischemia for age. Vascular: Atherosclerotic calcification.  No hyperdense vessel. Skull: No acute or aggressive finding. Sinuses/Orbits: Bilateral cataract resection. No pathologic finding. Other: These results were communicated to Dr. Lorraine Lax at 9:27 [place] 3/27/2019by text page via the [facility] messaging system. ASPECTS [facility] Stroke Program Early CT Score) Not scored with this clinical history IMPRESSION: No acute finding or change from 2018. Electronically Signed   By: Monte Fantasia M.D.   On: 02/19/2018 09:28    EKG: Independently reviewed.  Sinus tach with rate 115; nonspecific ST changes with no evidence of acute ischemia. No significant change when compared to 08/2017 ekg   Labs on Admission: I have personally reviewed the available labs and imaging studies at the time of the admission.  Pertinent labs:  WBC 12.2 (ANC 10.1) Hgb 15.2 Glucose 200 Trop poc 0.01   Assessment/Plan Principal Problem:   Acute encephalopathy Active Problems:   Hypothyroidism   Essential hypertension   Prediabetes   GERD (gastroesophageal reflux disease)   Unwitnessed fall   Seizure ([facility])    Acute encephalopathy  -likely secondary to seizure with post-ictal state as hx and similar presentation with last seizure. No acute findings on head CT -WBC is slightly elevated which could very well be a result of seizure and fall. No evidence of UTI or pna on chest xray. Pt is afebrile. Will monitor trend -No further imagining planned  -Admit obs tele -IV Keppra per neurology recs -seizure precautions, frequent neuro checks, unknown time on floor. Will check CK  Seizures -IV Keppra loading dose given, continue 500 BID  Hyperglycemia -has been diagnosed with prediabetes in the past.  -will check A1C -monitor  Hx Hypertension -It appears she was put on Lasix in 09/2017 to help control BP -Will hold for now as pt appears somewhat dry on exam -Monitor need for  additional meds. Reasonable control of BP on admit  Hypothroidism -TSH normal 11/18 -continine Synthroid   DVT prophylaxis:  SQ Lovenox  Code Status: DNR - confirmed with patient/family Family Communication: son at bedside on admission evaluation  Disposition Plan: Home once clinically improved Consults called: Neurology Admission status: obs  Patrici Ranks, NP-C [facility] [place]  pgr [phone]   If note is complete, please contact covering daytime or nighttime physician. [email or web address] Password [facility]  02/19/2018, 12:44 PM

## 2018-02-19 NOTE — Consult Note (Addendum)
Requesting Physician: EDP    Chief Complaint: AMS, weakness   History obtained from: Chart  HPI:                                                                                                                                       Samantha Woods is an 84 y.o. female with a pmhx of HTN, hypothyroidism, and previous seizure presenting from home with weakness and AMS. Per report, patient's family found her unresponsive on the floor this morning with eyes deviated to the right. On arrival to the ED she was altered, non verbal, with left sided hemiparesis and right gaze preference. Code stroke was initiated. CT head was negative for bleed or acute change, similar to prior scan in 2018. On reassessment, patient's strength had begun to improve. Patient is now able to move left hand.  Date last known well: 3/27 Time last known well: Midnight tPA Given: No, outside window NIHSS: N/A Baseline MRS: 2   Past Medical History:  Diagnosis Date  . GERD (gastroesophageal reflux disease)   . Hypertension   . Thyroid disease     Past Surgical History:  Procedure Laterality Date  . ABDOMINAL HYSTERECTOMY    . GALLBLADDER SURGERY      Family History  Problem Relation Age of Onset  . Heart disease Mother    Social History:  reports that she has never smoked. She has never used smokeless tobacco. She reports that she drinks alcohol. She reports that she does not use drugs.  Allergies:  Allergies  Allergen Reactions  . Sulfa Antibiotics Shortness Of Breath  . Darvon [Propoxyphene] Hives    Medications:                                                                                                                        I reviewed home medications   ROS:  14 systems reviewed and negative except above    Examination:                                                                                                       General: Appears well-developed and well-nourished.  Psych: Affect appropriate to situation Eyes: No scleral injection HENT: No OP obstrucion Head: Normocephalic.  Cardiovascular: Normal rate and regular rhythm.  Respiratory: Breathing comfortably on RA GI: Soft.  No distension. There is no tenderness.  Skin: WDI   Neurological Examination Mental Status: Opens eyes to voice but altered and non verbal. Unable to follow commands.  Cranial Nerves: II: Visual fields grossly normal,  III,IV, VI: ptosis not present, right gaze preferance, pupils equal, round, reactive to light and accommodation V,VII: No gross deficits, unable to assess facial sensation VIII: hearing normal bilaterally IX,X: Unable to assess XI: Unable to assess XII: Unable to assess Motor: Right : Moving right extremities spontaneously against gravity, resists passive movement of limbs Left: Moving left hand spontaneously against gravity, strength decreased proximal left upper extremity but difficult to assess with AMS. Left leg resisting passive movement. Tone and bulk:normal tone throughout; no atrophy noted Sensory: Unable to assess Gait: Deferred    Lab Results: Basic Metabolic Panel: No results for input(s): NA, K, CL, CO2, GLUCOSE, BUN, CREATININE, CALCIUM, MG, PHOS in the last 168 hours.  CBC: No results for input(s): WBC, NEUTROABS, HGB, HCT, MCV, PLT in the last 168 hours.  Coagulation Studies: No results for input(s): LABPROT, INR in the last 72 hours.  Imaging: No results found.   ASSESSMENT: 84 yo F with pmhx of HTN, hypothyroidism, and prior seizure on keppra presenting with AMS, left arm weakness, and right gaze preference after being found down by family this morning. Last known normal was midnight. Since arriving to the ED deficits are improving. Of note, patient's keppra was recently decreased to 250 mg qAM and 500 mg qPM.  Stroke alert  was cancelled as patient not tpa candidate and clinically low suspicion this will be Large vessel stroke.  IMPRESSION: Concerning for seizure with todd's paralysis, likely post-ictal  Less likely CVA    PLAN: -- Load with IV Keppra 1.2 g now, then 500 BID -- Repeat CT head tomorrow if left side weakness not improved -- EEG ordered  -- Frequent neuro checks -- Seizure precautions  -- Admit for obs, tele  -- Neuro will follow  Velna Ochs, M.D. - PGY2 02/19/2018, 9:53 AM     NEUROHOSPITALIST ADDENDUM Seen and examined the patient today. I have reviewed the history, exam and formulated plan as documented above by PAC/Resident with some changes made.  Will follow.  Karena Addison Makaela Cando MD [facility] [phone]  If 7pm to 7am, please call on call as listed on AMION.

## 2018-02-19 NOTE — ED Notes (Signed)
Patient transported to floor on monitor by RN.

## 2018-02-19 NOTE — ED Triage Notes (Addendum)
Pt presents to the ed with ems unresponsive found on the floor by family. Pt has no movement on the left side and is deviated to the right and is aphasic.  LSN 0000. Pt has history of seizures.

## 2018-02-20 ENCOUNTER — Inpatient Hospital Stay (HOSPITAL_COMMUNITY): Payer: PPO

## 2018-02-20 DIAGNOSIS — Z791 Long term (current) use of non-steroidal anti-inflammatories (NSAID): Secondary | ICD-10-CM | POA: Diagnosis not present

## 2018-02-20 DIAGNOSIS — K219 Gastro-esophageal reflux disease without esophagitis: Secondary | ICD-10-CM | POA: Diagnosis present

## 2018-02-20 DIAGNOSIS — E860 Dehydration: Secondary | ICD-10-CM | POA: Diagnosis present

## 2018-02-20 DIAGNOSIS — Z888 Allergy status to other drugs, medicaments and biological substances status: Secondary | ICD-10-CM | POA: Diagnosis not present

## 2018-02-20 DIAGNOSIS — R569 Unspecified convulsions: Secondary | ICD-10-CM | POA: Diagnosis present

## 2018-02-20 DIAGNOSIS — Z7982 Long term (current) use of aspirin: Secondary | ICD-10-CM | POA: Diagnosis not present

## 2018-02-20 DIAGNOSIS — Z885 Allergy status to narcotic agent status: Secondary | ICD-10-CM | POA: Diagnosis not present

## 2018-02-20 DIAGNOSIS — Z90710 Acquired absence of both cervix and uterus: Secondary | ICD-10-CM | POA: Diagnosis not present

## 2018-02-20 DIAGNOSIS — R7303 Prediabetes: Secondary | ICD-10-CM | POA: Diagnosis present

## 2018-02-20 DIAGNOSIS — I10 Essential (primary) hypertension: Secondary | ICD-10-CM | POA: Diagnosis present

## 2018-02-20 DIAGNOSIS — R531 Weakness: Secondary | ICD-10-CM | POA: Diagnosis present

## 2018-02-20 DIAGNOSIS — E876 Hypokalemia: Secondary | ICD-10-CM | POA: Diagnosis present

## 2018-02-20 DIAGNOSIS — W19XXXA Unspecified fall, initial encounter: Secondary | ICD-10-CM | POA: Diagnosis present

## 2018-02-20 DIAGNOSIS — Z882 Allergy status to sulfonamides status: Secondary | ICD-10-CM | POA: Diagnosis not present

## 2018-02-20 DIAGNOSIS — J690 Pneumonitis due to inhalation of food and vomit: Secondary | ICD-10-CM | POA: Diagnosis present

## 2018-02-20 DIAGNOSIS — G8194 Hemiplegia, unspecified affecting left nondominant side: Secondary | ICD-10-CM | POA: Diagnosis present

## 2018-02-20 DIAGNOSIS — G8384 Todd's paralysis (postepileptic): Secondary | ICD-10-CM | POA: Diagnosis present

## 2018-02-20 DIAGNOSIS — G9340 Encephalopathy, unspecified: Secondary | ICD-10-CM | POA: Diagnosis not present

## 2018-02-20 DIAGNOSIS — Z79899 Other long term (current) drug therapy: Secondary | ICD-10-CM | POA: Diagnosis not present

## 2018-02-20 DIAGNOSIS — E039 Hypothyroidism, unspecified: Secondary | ICD-10-CM | POA: Diagnosis present

## 2018-02-20 DIAGNOSIS — Z66 Do not resuscitate: Secondary | ICD-10-CM | POA: Diagnosis present

## 2018-02-20 DIAGNOSIS — J9811 Atelectasis: Secondary | ICD-10-CM | POA: Diagnosis present

## 2018-02-20 LAB — CBC
HEMATOCRIT: 41.8 % (ref 36.0–46.0)
Hemoglobin: 13.1 g/dL (ref 12.0–15.0)
MCH: 30 pg (ref 26.0–34.0)
MCHC: 31.3 g/dL (ref 30.0–36.0)
MCV: 95.7 fL (ref 78.0–100.0)
Platelets: 242 10*3/uL (ref 150–400)
RBC: 4.37 MIL/uL (ref 3.87–5.11)
RDW: 14.7 % (ref 11.5–15.5)
WBC: 11.9 10*3/uL — ABNORMAL HIGH (ref 4.0–10.5)

## 2018-02-20 LAB — BASIC METABOLIC PANEL
Anion gap: 11 (ref 5–15)
BUN: 18 mg/dL (ref 6–20)
CALCIUM: 9.4 mg/dL (ref 8.9–10.3)
CHLORIDE: 104 mmol/L (ref 101–111)
CO2: 26 mmol/L (ref 22–32)
CREATININE: 0.84 mg/dL (ref 0.44–1.00)
GFR calc Af Amer: >60 mL/min (ref >60)
GFR calc non Af Amer: 57 mL/min — ABNORMAL LOW (ref >60)
GLUCOSE: 96 mg/dL (ref 65–99)
Potassium: 3.9 mmol/L (ref 3.5–5.1)
Sodium: 141 mmol/L (ref 135–145)

## 2018-02-20 LAB — GLUCOSE, CAPILLARY
GLUCOSE-CAPILLARY: 95 mg/dL (ref 65–99)
Glucose-Capillary: 93 mg/dL (ref 65–99)

## 2018-02-20 LAB — MRSA PCR SCREENING: MRSA BY PCR: NEGATIVE

## 2018-02-20 MED ORDER — SODIUM CHLORIDE 0.9 % IV SOLN
1.5000 g | Freq: Four times a day (QID) | INTRAVENOUS | Status: DC
  Administered 2018-02-20 – 2018-02-22 (×9): 1.5 g via INTRAVENOUS
  Filled 2018-02-20 (×11): qty 1.5

## 2018-02-20 MED ORDER — HALOPERIDOL LACTATE 5 MG/ML IJ SOLN: 0.5000 mg | Freq: Four times a day (QID) | INTRAMUSCULAR | Status: DC | PRN

## 2018-02-20 NOTE — Evaluation (Signed)
Clinical/Bedside Swallow Evaluation Patient Details  Name: Samantha Woods MRN: [id number] Date of Birth: Sep 05, 1921  Today's Date: 02/20/2018 Time: SLP Start Time (ACUTE ONLY): 1018 SLP Stop Time (ACUTE ONLY): 1043 SLP Time Calculation (min) (ACUTE ONLY): 25 min  Past Medical History:  Past Medical History:  Diagnosis Date  . GERD (gastroesophageal reflux disease)   . Hypertension   . Seizure ([place])   . Thyroid disease    Past Surgical History:  Past Surgical History:  Procedure Laterality Date  . ABDOMINAL HYSTERECTOMY    . GALLBLADDER SURGERY     HPI:  Pt is a41 yo F with pmhx of HTN, hypothyroidism, and prior seizure on keppra presenting with AMS, left arm weakness, and right gaze preference after being found down by family. CT Head showed no acute findings. Pt's keppra drose was decreased recently, giving conern for likely seizure with Todd's paralysis and prolonged post-ictal state.   Assessment / Plan / Recommendation Clinical Impression  Pt seems primarily impacted by lethargy, needing Mod cues for arousal but then maintaining alertness well to consume POs. Her oral movements are slow and her mastication is likely already impacted by minimal dentition at baseline. Her son says that she has partial dentures at home but that she has not been wearing them lately as they're ill-fitting. Small bites of soft solids are only partially masticated and moderate lingual residue remains post-swallow. SLP provided Min cues for use of subsequent bites of puree to clear her oral cavity. Pt quickly drifted back to sleep upon completion of trials. No overt signs of aspiration are noted but her mentation and current oral function does put her at a higher risk. For today would start with Dys 1 textures and thin liquids, but would anticipate good prognosis for advancement as her alertness continues to improve. Will f/u for readiness to advance. SLP Visit Diagnosis: Dysphagia, oral phase (R13.11)     Aspiration Risk  Moderate aspiration risk    Diet Recommendation Dysphagia 1 (Puree);Thin liquid   Liquid Administration via: Cup;Straw Medication Administration: Whole meds with puree Supervision: Staff to assist with self feeding;Full supervision/cueing for compensatory strategies Compensations: Slow rate;Small sips/bites;Minimize environmental distractions Postural Changes: Seated upright at 90 degrees    Other  Recommendations Oral Care Recommendations: Oral care BID   Follow up Recommendations (tba)      Frequency and Duration min 2x/week  2 weeks       Prognosis Prognosis for Safe Diet Advancement: Good      Swallow Study   General HPI: Pt is a82 yo F with pmhx of HTN, hypothyroidism, and prior seizure on keppra presenting with AMS, left arm weakness, and right gaze preference after being found down by family. CT Head showed no acute findings. Pt's keppra drose was decreased recently, giving conern for likely seizure with Todd's paralysis and prolonged post-ictal state. Type of Study: Bedside Swallow Evaluation Previous Swallow Assessment: none in chart Diet Prior to this Study: NPO Temperature Spikes Noted: No Respiratory Status: Room air History of Recent Intubation: No Behavior/Cognition: Lethargic/Drowsy;Cooperative Oral Cavity Assessment: Dry Oral Care Completed by SLP: No Oral Cavity - Dentition: Missing dentition;Dentures, not available(son says she hasn't been wearing her partials - not fitting) Vision: Functional for self-feeding Self-Feeding Abilities: Needs assist Patient Positioning: Upright in bed Baseline Vocal Quality: Normal Volitional Cough: Strong(with cues for increased effort) Volitional Swallow: Able to elicit    Oral/Motor/Sensory Function Overall Oral Motor/Sensory Function: (not following commands for testing)   Ice Chips Ice chips: Within functional  limits Presentation: Spoon   Thin Liquid Thin Liquid: Within functional  limits Presentation: Cup;Self Fed;Straw    Nectar Thick Nectar Thick Liquid: Not tested   Honey Thick Honey Thick Liquid: Not tested   Puree Puree: Impaired Presentation: Spoon;Self Fed Oral Phase Functional Implications: Prolonged oral transit   Solid   GO   Solid: Impaired Presentation: Self Fed Oral Phase Impairments: Impaired mastication Oral Phase Functional Implications: Oral residue;Prolonged oral transit        Germain Osgood 02/20/2018,12:32 PM  Germain Osgood, M.A. CCC-SLP [phone]

## 2018-02-20 NOTE — Progress Notes (Signed)
ANTIBIOTIC CONSULT NOTE - INITIAL  Pharmacy Consult for Ampicillin/Sulbactam  Indication: Aspiration PNA  Allergies  Allergen Reactions  . Sulfa Antibiotics Shortness Of Breath  . Lorazepam Other (See Comments)    Restless, "fidgety"  . Darvon [Propoxyphene] Hives    Patient Measurements: Weight: 143 lb (64.9 kg) Adjusted Body Weight: 64.9 kg  Vital Signs: Temp: 97.9 F (36.6 C) (03/28 0521) Temp Source: Oral (03/28 0521) BP: 139/73 (03/28 0521) Pulse Rate: 77 (03/28 0521) Intake/Output from previous day: 03/27 0701 - 03/28 0700 In: 728.3 [I.V.:628.3; IV Piggyback:100] Out: 50 [Urine:50] Intake/Output from this shift: No intake/output data recorded.  Labs: Recent Labs    02/19/18 0847 02/19/18 0903 02/20/18 0438  WBC 12.2*  --  11.9*  HGB 15.2* 16.0* 13.1  PLT 271  --  242  CREATININE 0.92 0.80 0.84   Estimated Creatinine Clearance: 32.8 mL/min (by C-G formula based on SCr of 0.84 mg/dL). No results for input(s): VANCOTROUGH, VANCOPEAK, VANCORANDOM, GENTTROUGH, GENTPEAK, GENTRANDOM, TOBRATROUGH, TOBRAPEAK, TOBRARND, AMIKACINPEAK, AMIKACINTROU, AMIKACIN in the last 72 hours.   Microbiology: Recent Results (from the past 720 hour(s))  Urine Culture     Status: None   Collection Time: 01/31/18  2:30 PM  Result Value Ref Range Status   MICRO NUMBER: [id number]  Final   SPECIMEN QUALITY: ADEQUATE  Final   Sample Source URINE  Final   STATUS: FINAL  Final   Result:   Final    Three or more organisms present, each greater than 10,000 cu/mL. May represent normal flora contamination from external genitalia. No further testing is required.  MRSA PCR Screening     Status: None   Collection Time: 02/19/18 10:01 PM  Result Value Ref Range Status   MRSA by PCR NEGATIVE NEGATIVE Final    Comment:        The GeneXpert MRSA Assay (FDA approved for NASAL specimens only), is one component of a comprehensive MRSA colonization surveillance program. It is not intended to  diagnose MRSA infection nor to guide or monitor treatment for MRSA infections. Performed at [facility], [place] [address], [place],  [address]     Medical History: Past Medical History:  Diagnosis Date  . GERD (gastroesophageal reflux disease)   . Hypertension   . Seizure ([place])   . Thyroid disease     Medications:  Infusions:  . sodium chloride 50 mL/hr at 02/19/18 1726  . ampicillin-sulbactam (UNASYN) IV    . levETIRAcetam Stopped (02/19/18 2220)   Assessment: 84 yo female with PMH of hypothyroidism, seizure d/o, HTN and GERD who presented with apparent seizure and prolonged post ictal state. MD has asked pharmacy to dose ampicillin/sulbactam for aspiration PNA. CrCl is stable at 32 ml/min per CG equation. WBC elevated at 11.9, and afebrile.    Plan:  Ampicillin/sulbactam 1.5gm IV q6h adjusted for CrCl Will monitor for WBC, fever, and renal function changes Pharmacy will follow.   [place] Pharmacist Pager: [phone] Phone: [phone] 02/20/2018,10:19 AM

## 2018-02-20 NOTE — Progress Notes (Signed)
PROGRESS NOTE    Samantha Woods  HQI:[id number] DOB: [birth year] DOA: 02/19/2018 PCP: Janora Norlander, DO    Brief Narrative: Samantha Woods is a 84 y.o. female with medical history significant of seizure newly dx in 09/2017 admission (she was found to have Klebsiella UTI at the time), hypertension, hypothyroidism, GERD. Pt presented to ED today after being found on bedroom floor by son. Unable to obtain any hx from pt as she received IV Ativan for being combative in CT on arrival. Son states he heard pt get up to use the restroom around 0400. At [place] his wife woke up to find water running in the bathroom and found pt on floor of bedroom, face down and with heavy breathing. Per ED notes, pt nonverbal on arrival with right sided gaze and left hemiparesis. These symptoms improved prior to receiving Ativan. On my assessment, she remains nonverbal and pushing me off when I attempt to examine. No focal deficits noted, but very difficult to assess. Of note, Keppra dose recently decreased on 01/31/18 by neurologist because son mentioned she might be more tired than usual since being on medication.    ED Course: CT with no acute findings and chronic changes r/t age. Again, IV Ativan given bc pt was combative in CT. Pt has been given IV Keppra loading dose per neurology recs   Assessment & Plan:   Principal Problem:   Acute encephalopathy Active Problems:   Hypothyroidism   Essential hypertension   Prediabetes   GERD (gastroesophageal reflux disease)   Unwitnessed fall   Seizure ([place])  1-Acute encephalopathy; suspect multifactorial related to medication , haldol vs infection vs recent seizure.  -she is lethargic, but would open eyes to voice, follows command and answer few questions.  -avoid sedatives as possible. Decreased haldol to 0.5 PRN.  -treat for aspiration PNA>  -CT head negative.   2-Seizure; Loaded with Keppra. Keppra home dose increase to 500 mg BID.  No focal weakness on exam  today.   3-PNA, aspiration;  Some cough and increase upper airway secretion.  Chest X-ray ; Mild left basilar opacity is noted concerning for atelectasis with associated pleural effusion. Mild leukocytosis.  Start Unasyn IV.   4-Dehydration; decrease urine out put. Poor oral intake. Increase IV fluids.     DVT prophylaxis: Lovenox Code Status: DNR Family Communication: Son Who was at bedside.  Disposition Plan: remain in the hospital with IV fluids, patient with AMS, not eating.   Consultants:   Neurology    Procedures:  EEG  Antimicrobials:  Unasyn 3-28  Subjective: patient is lethargic, open eyes, answer few questions, follows some command. Denies pain.   Objective: Vitals:   02/19/18 1700 02/19/18 1723 02/19/18 2044 02/20/18 0521  BP:  (!) 116/53 (!) 109/32 139/73  Pulse: (!) 101 94 89 77  Resp: (!) 22  17 18   Temp: (!) 97.5 F (36.4 C)  98.8 F (37.1 C) 97.9 F (36.6 C)  TempSrc:   Oral Oral  SpO2:   97% 95%  Weight:        Intake/Output Summary (Last 24 hours) at 02/20/2018 0902 Last data filed at 02/20/2018 0600 Gross per 24 hour  Intake 728.33 ml  Output 50 ml  Net 678.33 ml   Filed Weights   02/19/18 0843  Weight: 64.9 kg (143 lb)    Examination:  General exam: Lethargic Respiratory system: Clear to auscultation. Respiratory effort normal. Cardiovascular system: S1 & S2 heard, RRR. No JVD, murmurs, rubs, gallops or clicks.  No pedal edema. Gastrointestinal system: Abdomen is nondistended, soft and nontender. No organomegaly or masses felt. Normal bowel sounds heard. Central nervous system: sleepy, follow some commands.  Extremities: Symmetric 5 x 5 power. Skin: No rashes, lesions or ulcers   Data Reviewed: I have personally reviewed following labs and imaging studies  CBC: Recent Labs  Lab 02/19/18 0847 02/19/18 0903 02/20/18 0438  WBC 12.2*  --  11.9*  NEUTROABS 10.1*  --   --   HGB 15.2* 16.0* 13.1  HCT 45.7 47.0* 41.8  MCV 93.1   --  95.7  PLT 271  --  409   Basic Metabolic Panel: Recent Labs  Lab 02/19/18 0847 02/19/18 0903 02/20/18 0438  NA 139 141 141  K 4.0 4.0 3.9  CL 103 102 104  CO2 21*  --  26  GLUCOSE 199* 198* 96  BUN 14 19 18   CREATININE 0.92 0.80 0.84  CALCIUM 9.6  --  9.4   GFR: Estimated Creatinine Clearance: 32.8 mL/min (by C-G formula based on SCr of 0.84 mg/dL). Liver Function Tests: Recent Labs  Lab 02/19/18 0847  AST 41  ALT 20  ALKPHOS 155*  BILITOT 1.0  PROT 7.2  ALBUMIN 4.1   No results for input(s): LIPASE, AMYLASE in the last 168 hours. No results for input(s): AMMONIA in the last 168 hours. Coagulation Profile: Recent Labs  Lab 02/19/18 1042  INR 1.05   Cardiac Enzymes: Recent Labs  Lab 02/19/18 0847  CKTOTAL 61  CKMB 2.0   BNP (last 3 results) No results for input(s): PROBNP in the last 8760 hours. HbA1C: Recent Labs    02/19/18 1906  HGBA1C 5.4   CBG: Recent Labs  Lab 02/20/18 0747  GLUCAP 95   Lipid Profile: No results for input(s): CHOL, HDL, LDLCALC, TRIG, CHOLHDL, LDLDIRECT in the last 72 hours. Thyroid Function Tests: No results for input(s): TSH, T4TOTAL, FREET4, T3FREE, THYROIDAB in the last 72 hours. Anemia Panel: No results for input(s): VITAMINB12, FOLATE, FERRITIN, TIBC, IRON, RETICCTPCT in the last 72 hours. Sepsis Labs: No results for input(s): PROCALCITON, LATICACIDVEN in the last 168 hours.  Recent Results (from the past 240 hour(s))  MRSA PCR Screening     Status: None   Collection Time: 02/19/18 10:01 PM  Result Value Ref Range Status   MRSA by PCR NEGATIVE NEGATIVE Final    Comment:        The GeneXpert MRSA Assay (FDA approved for NASAL specimens only), is one component of a comprehensive MRSA colonization surveillance program. It is not intended to diagnose MRSA infection nor to guide or monitor treatment for MRSA infections. Performed at [facility], [place] [address], [place], [place] [address]           Radiology Studies: Dg Chest Port 1 View  Result Date: 02/19/2018 CLINICAL DATA:  Altered mental status. EXAM: PORTABLE CHEST 1 VIEW COMPARISON:  Radiograph of September 18, 2017. FINDINGS: Stable cardiomegaly. Atherosclerosis of thoracic aorta is noted. No pneumothorax is noted. Right lung is clear. Left basilar atelectasis and pleural effusion is noted. Old distal right clavicular fracture is noted. IMPRESSION: Mild left basilar opacity is noted concerning for atelectasis with associated pleural effusion. Aortic Atherosclerosis (ICD10-I70.0). Electronically Signed   By: Marijo Conception, M.D.   On: 02/19/2018 10:38   Ct Head Code Stroke Wo Contrast  Result Date: 02/19/2018 CLINICAL DATA:  Code stroke.  Altered level of consciousness. EXAM: CT HEAD WITHOUT CONTRAST TECHNIQUE: Contiguous axial images were obtained  from the base of the skull through the vertex without intravenous contrast. COMPARISON:  09/18/2017 FINDINGS: Brain: No evidence of acute infarction, hemorrhage, hydrocephalus, extra-axial collection or mass lesion/mass effect. Mild atrophy and chronic small vessel ischemia for age. Vascular: Atherosclerotic calcification.  No hyperdense vessel. Skull: No acute or aggressive finding. Sinuses/Orbits: Bilateral cataract resection. No pathologic finding. Other: These results were communicated to Dr. Lorraine Lax at 9:27 [place] 3/27/2019by text page via the [facility] messaging system. ASPECTS [facility] Stroke Program Early CT Score) Not scored with this clinical history IMPRESSION: No acute finding or change from 2018. Electronically Signed   By: Monte Fantasia M.D.   On: 02/19/2018 09:28        Scheduled Meds: . enoxaparin (LOVENOX) injection  40 mg Subcutaneous Q24H  . levothyroxine  75 mcg Oral QAC breakfast  . pantoprazole  40 mg Oral Daily   Continuous Infusions: . sodium chloride 50 mL/hr at 02/19/18 1726  . levETIRAcetam Stopped (02/19/18 2220)     LOS: 0 days    Time spent: 35  minutes.     Elmarie Shiley, MD [facility] Pager [phone]  If 7PM-7AM, please contact night-coverage [email or web address] Password [email or web address] 02/20/2018, 9:02 AM

## 2018-02-20 NOTE — Progress Notes (Signed)
EEG complete - results pending 

## 2018-02-20 NOTE — Plan of Care (Signed)
  Problem: Clinical Measurements: Goal: Ability to maintain clinical measurements within normal limits will improve Outcome: Progressing   Problem: Elimination: Goal: Will not experience complications related to urinary retention Outcome: Progressing Note:  Placed foley catheter due to acute retention, bladder scan post void over 4109ml Family made aware of foley placement   Problem: Safety: Goal: Ability to remain free from injury will improve Outcome: Progressing Note:  Patient placed on low bed, floor mats, and bed alarm Mitts placed on hands to prevent pulling at lines

## 2018-02-20 NOTE — Progress Notes (Addendum)
Reason for consult:   Subjective: Husband reports delirium overnight requiring haldol. Patient is sleepy this morning but alert to voice and following commands.   ROS: Unable to obtain due to poor mental status  Examination  Vital signs in last 24 hours: Temp:  [97.5 F (36.4 C)-98.8 F (37.1 C)] 97.9 F (36.6 C) (03/28 0521) Pulse Rate:  [75-112] 77 (03/28 0521) Resp:  [11-24] 18 (03/28 0521) BP: (109-218)/(30-204) 139/73 (03/28 0521) SpO2:  [90 %-100 %] 95 % (03/28 0521)  General: Not in distress, cooperative RS: breathing comfortably on RA Extremities: normal   Neuro: MS: Alert to voice, following commands CN: pupils equal and reactive,  EOMI, face symmetric Motor: Moves all extremities spontaneously against gravity  Sensation: Intact to light touch  Gait: not tested  Basic Metabolic Panel: Recent Labs  Lab 02/19/18 0847 02/19/18 0903 02/20/18 0438  NA 139 141 141  K 4.0 4.0 3.9  CL 103 102 104  CO2 21*  --  26  GLUCOSE 199* 198* 96  BUN 14 19 18   CREATININE 0.92 0.80 0.84  CALCIUM 9.6  --  9.4    CBC: Recent Labs  Lab 02/19/18 0847 02/19/18 0903 02/20/18 0438  WBC 12.2*  --  11.9*  NEUTROABS 10.1*  --   --   HGB 15.2* 16.0* 13.1  HCT 45.7 47.0* 41.8  MCV 93.1  --  95.7  PLT 271  --  242     Coagulation Studies: Recent Labs    02/19/18 1042  LABPROT 13.6  INR 1.05    Imaging Reviewed:   ASSESSMENT: 84 yo F with pmhx of HTN, hypothyroidism, and prior seizure on keppra presenting with AMS, left arm weakness, and right gaze preference after being found down by family yesterday morning. Last known normal was midnight. Deficits began improving shortly after arriving to the ED. Of note, patient's keppra was recently decreased to 250 mg BID.  Stroke alert was cancelled as patient not tpa candidate and clinically low suspicion this would be Large vessel stroke.   IMPRESSION: Seizure with todd's paralysis - resolved Post-ictal state -  resolved  PLAN: -- Continue Keppra 500 mg BID now and on discharge -- Continue seizure precautions, neuro checks -- PT/OT -- Neurology will sign off, please call with any questions  Per [facility] statutes, patients with seizures are not allowed to drive until they have been seizure-free for six months.   Use caution when using heavy equipment or power tools. Avoid working on ladders or at heights. Take showers instead of baths. Ensure the water temperature is not too high on the home water heater. Do not go swimming alone. Do not lock yourself in a room alone (i.e. bathroom). When caring for infants or small children, sit down when holding, feeding, or changing them to minimize risk of injury to the child in the event you have a seizure. Maintain good sleep hygiene. Avoid alcohol.    If patient has another seizure, call 911 and bring them back to the ED if: A.  The seizure lasts longer than 5 minutes.      B.  The patient doesn't wake shortly after the seizure or has new problems such as difficulty seeing, speaking or moving following the seizure C.  The patient was injured during the seizure D.  The patient has a temperature over 102 F (39C) E.  The patient vomited during the seizure and now is having trouble breathing   Velna Ochs, M.D. - PGY2 02/20/2018,  10:14 AM    NEUROHOSPITALIST ADDENDUM Seen and examined the patient today. I reviewed the note and formulated plan as documented above by PAC/Resident.  Will sign off. Thanks for the consult.  Karena Addison Sota Hetz MD [facility] [phone]  If 7pm to 7am, please call on call as listed on AMION.

## 2018-02-21 ENCOUNTER — Inpatient Hospital Stay (HOSPITAL_COMMUNITY): Payer: PPO

## 2018-02-21 DIAGNOSIS — G9340 Encephalopathy, unspecified: Secondary | ICD-10-CM

## 2018-02-21 LAB — TSH: TSH: 2.814 u[IU]/mL (ref 0.350–4.500)

## 2018-02-21 LAB — GLUCOSE, CAPILLARY: Glucose-Capillary: 79 mg/dL (ref 65–99)

## 2018-02-21 LAB — CK: Total CK: 172 U/L (ref 38–234)

## 2018-02-21 LAB — URINE CULTURE

## 2018-02-21 MED ORDER — ACETAMINOPHEN 325 MG PO TABS
650.0000 mg | ORAL_TABLET | Freq: Two times a day (BID) | ORAL | Status: DC
  Administered 2018-02-21 – 2018-02-24 (×7): 650 mg via ORAL
  Filled 2018-02-21 (×7): qty 2

## 2018-02-21 NOTE — Progress Notes (Signed)
PROGRESS NOTE    [name]  HYQ:[id number] DOB: 10/24/1921 DOA: 02/19/2018 PCP: Janora Norlander, DO    Brief Narrative: Samantha Woods is a 84 y.o. female with medical history significant of seizure newly dx in 09/2017 admission (she was found to have Klebsiella UTI at the time), hypertension, hypothyroidism, GERD. Pt presented to ED today after being found on bedroom floor by son. Unable to obtain any hx from pt as she received IV Ativan for being combative in CT on arrival. Son states he heard pt get up to use the restroom around 0400. At [place] his wife woke up to find water running in the bathroom and found pt on floor of bedroom, face down and with heavy breathing. Per ED notes, pt nonverbal on arrival with right sided gaze and left hemiparesis. These symptoms improved prior to receiving Ativan. On my assessment, she remains nonverbal and pushing me off when I attempt to examine. No focal deficits noted, but very difficult to assess. Of note, Keppra dose recently decreased on 01/31/18 by neurologist because son mentioned she might be more tired than usual since being on medication.    ED Course: CT with no acute findings and chronic changes r/t age. Again, IV Ativan given bc pt was combative in CT. Pt has been given IV Keppra loading dose per neurology recs   Assessment & Plan:   Principal Problem:   Acute encephalopathy Active Problems:   Hypothyroidism   Essential hypertension   Prediabetes   GERD (gastroesophageal reflux disease)   Unwitnessed fall   Seizure ([place])  1-Acute encephalopathy; suspect multifactorial related to medication , haldol vs infection vs recent seizure.  -she is lethargic, but would open eyes to voice, follows command and answer few questions.  -avoid sedatives as possible. Decreased haldol to 0.5 PRN.  -treat for aspiration PNA>  -CT head negative.  -improved today, alert, follows command.   2-Seizure; Loaded with Keppra. Keppra home dose increase  to 500 mg BID.  No focal weakness on exam today.  EEG; 1. Mild to moderate diffuse slowing of the waking background. Additional focal slowing over the right hemisphere    3-PNA, aspiration;  Some cough and increase upper airway secretion.  Chest X-ray ; Mild left basilar opacity is noted concerning for atelectasis with associated pleural effusion. Mild leukocytosis.  Continue with  Unasyn IV.   4-Dehydration; decrease urine out put. Poor oral intake.continue with IV fluids.   Muscle pain, weakness. Check TSH, CK, Vitamin D.    DVT prophylaxis: Lovenox Code Status: DNR Family Communication: Son Who was at bedside.  Disposition Plan: remain in the hospital with IV fluids, patient with AMS, not eating.   Consultants:   Neurology    Procedures:  EEG  Antimicrobials:  Unasyn 3-28  Subjective: She is alert, follows command.  She is complaining of mild headaches, has history of headaches at home. "head doesn't feel right ". Complaining of muscle pain upper arm. Also legs pain.  Also feels legs are heavy difficult to move them.  Also hip pain.   Objective: Vitals:   02/20/18 1457 02/20/18 2135 02/21/18 0622 02/21/18 1341  BP: 126/62 (!) 135/59 (!) 155/63 116/66  Pulse: 88 64 75 76  Resp: 18 18 16 16   Temp: 98.4 F (36.9 C) 98.2 F (36.8 C) 97.7 F (36.5 C) 97.8 F (36.6 C)  TempSrc: Oral Oral Oral Oral  SpO2: 94% 100% 99% 100%  Weight:        Intake/Output Summary (Last 24  hours) at 02/21/2018 1606 Last data filed at 02/21/2018 1500 Gross per 24 hour  Intake 2900 ml  Output 1900 ml  Net 1000 ml   Filed Weights   02/19/18 0843  Weight: 64.9 kg (143 lb)    Examination:  General exam: Alert, follows command Respiratory system: CTA Cardiovascular system: S 1, S 2 RRR Gastrointestinal system: BS present,. Soft, nt Central nervous system: alert follows command, no focal deficit upper extremity. Lower extremity was able to move both leg slowly  Extremities:  Symmetric 5 x 5 power. Skin: No rashes, lesions or ulcers   Data Reviewed: I have personally reviewed following labs and imaging studies  CBC: Recent Labs  Lab 02/19/18 0847 02/19/18 0903 02/20/18 0438  WBC 12.2*  --  11.9*  NEUTROABS 10.1*  --   --   HGB 15.2* 16.0* 13.1  HCT 45.7 47.0* 41.8  MCV 93.1  --  95.7  PLT 271  --  016   Basic Metabolic Panel: Recent Labs  Lab 02/19/18 0847 02/19/18 0903 02/20/18 0438  NA 139 141 141  K 4.0 4.0 3.9  CL 103 102 104  CO2 21*  --  26  GLUCOSE 199* 198* 96  BUN 14 19 18   CREATININE 0.92 0.80 0.84  CALCIUM 9.6  --  9.4   GFR: Estimated Creatinine Clearance: 32.8 mL/min (by C-G formula based on SCr of 0.84 mg/dL). Liver Function Tests: Recent Labs  Lab 02/19/18 0847  AST 41  ALT 20  ALKPHOS 155*  BILITOT 1.0  PROT 7.2  ALBUMIN 4.1   No results for input(s): LIPASE, AMYLASE in the last 168 hours. No results for input(s): AMMONIA in the last 168 hours. Coagulation Profile: Recent Labs  Lab 02/19/18 1042  INR 1.05   Cardiac Enzymes: Recent Labs  Lab 02/19/18 0847 02/21/18 1433  CKTOTAL 61 172  CKMB 2.0  --    BNP (last 3 results) No results for input(s): PROBNP in the last 8760 hours. HbA1C: Recent Labs    02/19/18 1906  HGBA1C 5.4   CBG: Recent Labs  Lab 02/20/18 0747 02/20/18 0852 02/21/18 0805  GLUCAP 95 93 79   Lipid Profile: No results for input(s): CHOL, HDL, LDLCALC, TRIG, CHOLHDL, LDLDIRECT in the last 72 hours. Thyroid Function Tests: Recent Labs    02/21/18 1433  TSH 2.814   Anemia Panel: No results for input(s): VITAMINB12, FOLATE, FERRITIN, TIBC, IRON, RETICCTPCT in the last 72 hours. Sepsis Labs: No results for input(s): PROCALCITON, LATICACIDVEN in the last 168 hours.  Recent Results (from the past 240 hour(s))  MRSA PCR Screening     Status: None   Collection Time: 02/19/18 10:01 PM  Result Value Ref Range Status   MRSA by PCR NEGATIVE NEGATIVE Final    Comment:          The GeneXpert MRSA Assay (FDA approved for NASAL specimens only), is one component of a comprehensive MRSA colonization surveillance program. It is not intended to diagnose MRSA infection nor to guide or monitor treatment for MRSA infections. Performed at [facility], [place] [address], [place], [place] [address]   Urine culture     Status: Abnormal   Collection Time: 02/19/18 11:46 PM  Result Value Ref Range Status   Specimen Description URINE, CATHETERIZED  Final   Special Requests NONE  Final   Culture (A)  Final    <10,000 COLONIES/mL INSIGNIFICANT GROWTH Performed at [facility], [place] [address], [place], [place] [address]  Report Status 02/21/2018 FINAL  Final         Radiology Studies: Dg Hips Bilat With Pelvis 3-4 Views  Result Date: 02/21/2018 CLINICAL DATA:  Fall after seizure EXAM: DG HIP (WITH OR WITHOUT PELVIS) 3-4V BILAT COMPARISON:  09/23/2017 FINDINGS: Pubic symphysis and rami are intact. Multiple calcified phleboliths. SI joint degenerative changes. No acute displaced fracture or malalignment. IMPRESSION: No acute osseous abnormality. Electronically Signed   By: Donavan Foil M.D.   On: 02/21/2018 15:22        Scheduled Meds: . acetaminophen  650 mg Oral BID  . enoxaparin (LOVENOX) injection  40 mg Subcutaneous Q24H  . levothyroxine  75 mcg Oral QAC breakfast  . pantoprazole  40 mg Oral Daily   Continuous Infusions: . sodium chloride 100 mL/hr at 02/21/18 0530  . ampicillin-sulbactam (UNASYN) IV Stopped (02/21/18 1154)  . levETIRAcetam Stopped (02/21/18 1014)     LOS: 1 day    Time spent: 35 minutes.     Elmarie Shiley, MD [facility] Pager [phone]  If 7PM-7AM, please contact night-coverage [email or web address] Password [facility] 02/21/2018, 4:06 PM

## 2018-02-21 NOTE — Procedures (Addendum)
ELECTROENCEPHALOGRAM REPORT  Date of Study: 02/20/2018 (assigned to this provider on 02/21/2018)  Patient's Name: Samantha Woods MRN: [id number] Date of Birth: 1921-09-03  Referring Provider: Dr. Karena Addison Aroor  Clinical History: This is a 84 year old woman with seizure, altered mental status  Medications: Keppra, Ativan  Technical Summary: A multichannel digital EEG recording measured by the international 10-20 system with electrodes applied with paste and impedances below 5000 ohms performed as portable with EKG monitoring in an awake and asleep patient.  Hyperventilation was not performed. Photic stimulation was performed.  The digital EEG was referentially recorded, reformatted, and digitally filtered in a variety of bipolar and referential montages for optimal display.   Description: The patient is awake and asleep during the recording.  During maximal wakefulness, there is an asymmetric, medium voltage 7 Hz posterior dominant rhythm that attenuates with eye opening, better formed over the left occipital region. This is admixed with a small amount of diffuse 4-5 Hz theta and occasional diffuse 2-3 Hz delta slowing of the waking background. There is additional occasional focal delta slowing over the right hemisphere with loss of faster frequencies in the right hemisphere.  During drowsiness and sleep, there is an increase in theta and delta slowing of the background with poorly formed vertex waves and sleep spindles seen. Photic stimulation did not elicit any abnormalities.  There were no epileptiform discharges or electrographic seizures seen.    EKG lead showed irregular rhythm.  Impression: This awake and asleep EEG is abnormal due to the presence of: 1. Mild to moderate diffuse slowing of the waking background 2. Additional focal slowing over the right hemisphere  Clinical Correlation of the above findings indicates diffuse cerebral dysfunction that is non-specific in etiology and  can be seen with hypoxic/ischemic injury, toxic/metabolic encephalopathies, neurodegenerative disorders, or medication effect. Focal slowing over the right hemisphere indicates focal cerebral dysfunction in this region suggestive of underlying structural or physiologic abnormality, and may be post-ictal change as well. The absence of epileptiform discharges does not rule out a clinical diagnosis of epilepsy.  Clinical correlation is advised.   Ellouise Newer, M.D.

## 2018-02-21 NOTE — Progress Notes (Signed)
OT Cancellation Note  Patient Details Name: Carrieanne Kleen MRN: [id number] DOB: 1920/12/02   Cancelled Treatment:    Reason Eval/Treat Not Completed: Patient at procedure or test/ unavailable(pt in radiology). Will follow.  Malka So 02/21/2018, 3:11 PM  02/21/2018 Nestor Lewandowsky, OTR/L Pager: [phone]

## 2018-02-21 NOTE — Progress Notes (Signed)
  Speech Language Pathology Treatment: Dysphagia  Patient Details Name: Samantha Woods MRN: [id number] DOB: 1921-01-03 Today's Date: 02/21/2018 Time: 8016-5537 SLP Time Calculation (min) (ACUTE ONLY): 17 min  Assessment / Plan / Recommendation Clinical Impression  Pt is much more alert and conversational today, and denies any difficulties with meals so far. She consumed advanced trials of soft solids with mild lingual residue, which cleared easily with Min cues from SLP for use of a liquid wash. Pt did have occasional, delayed throat clearing across intake, particularly with solids, but her vocal quality remained clear and no coughing was elicited even with large, consecutive sips. Recommend to advance to baseline diet of soft solids (Dys 3), continuing thin liquids, with brief SLP f/u for tolerance.   HPI HPI: Pt is a83 yo F with pmhx of HTN, hypothyroidism, and prior seizure on keppra presenting with AMS, left arm weakness, and right gaze preference after being found down by family. CT Head showed no acute findings. Pt's keppra drose was decreased recently, giving conern for likely seizure with Todd's paralysis and prolonged post-ictal state.      SLP Plan  Continue with current plan of care       Recommendations  Diet recommendations: Dysphagia 3 (mechanical soft);Thin liquid Liquids provided via: Cup;Straw Medication Administration: Whole meds with puree Supervision: Patient able to self feed;Intermittent supervision to cue for compensatory strategies Compensations: Slow rate;Small sips/bites;Minimize environmental distractions Postural Changes and/or Swallow Maneuvers: Seated upright 90 degrees                Oral Care Recommendations: Oral care BID Follow up Recommendations: 24 hour supervision/assistance SLP Visit Diagnosis: Dysphagia, oral phase (R13.11) Plan: Continue with current plan of care       GO                Germain Osgood 02/21/2018, 4:08  PM  Germain Osgood, M.A. CCC-SLP [phone]

## 2018-02-21 NOTE — Evaluation (Signed)
Physical Therapy Evaluation Patient Details Name: Samantha Woods MRN: [id number] DOB: [birth year] Today's Date: 02/21/2018   History of Present Illness  84 yo female with onset of pleural effusion and AMS with seizure precautions after being found down by family, recent SNF stay from clavicular fracture.  Had a new seizure and now prolonged weakness from the post ictal state.  PMHx:  HTN, hypothyroidism, seizures,   Clinical Impression  Pt is much weaker than described by son at home after a recent rehab admission from a fall and fracture of clavicle.  Her plan is to request another rehab stay as she is quite weak to stand esp from the height of the commode.  Given that she was able to walk mult times a day alone, she is not going to be nearly as independent for this new medical change without significant rehab.  Follow acutely for strengthening and balance training and will see how she progresses to see if return directly home becomes more feasible.    Follow Up Recommendations SNF    Equipment Recommendations  None recommended by PT    Recommendations for Other Services       Precautions / Restrictions Precautions Precautions: Fall(telemtry) Precaution Comments: confusion and safety awareness is down Restrictions Weight Bearing Restrictions: No      Mobility  Bed Mobility               General bed mobility comments: up when PT arrived  Transfers Overall transfer level: Needs assistance Equipment used: Rolling walker (2 wheeled);1 person hand held assist Transfers: Sit to/from Stand Sit to Stand: Max assist;Mod assist         General transfer comment: max from commode and mod from chair  Ambulation/Gait Ambulation/Gait assistance: Min assist Ambulation Distance (Feet): 40 Feet Assistive device: Rolling walker (2 wheeled);1 person hand held assist   Gait velocity: reduced Gait velocity interpretation: Below normal speed for age/gender General Gait Details: Pt  walks slowly with dense cues for direction and safety but is able to support with UE's on walker to take steps  Stairs            Wheelchair Mobility    Modified Rankin (Stroke Patients Only)       Balance Overall balance assessment: Needs assistance;History of Falls Sitting-balance support: Feet supported;Bilateral upper extremity supported Sitting balance-Leahy Scale: Fair     Standing balance support: Bilateral upper extremity supported;During functional activity Standing balance-Leahy Scale: Poor                               Pertinent Vitals/Pain Pain Assessment: No/denies pain    Home Living Family/patient expects to be discharged to:: Private residence Living Arrangements: Children Available Help at Discharge: Family;Available PRN/intermittently Type of Home: House Home Access: Ramped entrance     Home Layout: One level Home Equipment: Cane - single point;Shower seat - built in;Walker - 2 wheels Additional Comments: son is with pt but leaves for up to 2 hours at a time, but pt was walking independently on her RW    Prior Function Level of Independence: Independent with assistive device(s)         Comments: Pt reports she performed ADLs mod I, and ambulated with [facility] when out of the house      Hand Dominance   Dominant Hand: Right    Extremity/Trunk Assessment   Upper Extremity Assessment Upper Extremity Assessment: Generalized weakness    Lower Extremity  Assessment Lower Extremity Assessment: Generalized weakness    Cervical / Trunk Assessment Cervical / Trunk Assessment: Kyphotic  Communication   Communication: HOH  Cognition Arousal/Alertness: Awake/alert Behavior During Therapy: WFL for tasks assessed/performed Overall Cognitive Status: Within Functional Limits for tasks assessed                                 General Comments: pt follows directions with PT      General Comments      Exercises      Assessment/Plan    PT Assessment Patient needs continued PT services  PT Problem List Decreased strength;Decreased range of motion;Decreased activity tolerance;Decreased balance;Decreased mobility;Decreased coordination;Decreased knowledge of use of DME;Decreased safety awareness;Cardiopulmonary status limiting activity       PT Treatment Interventions DME instruction;Gait training;Functional mobility training;Therapeutic activities;Therapeutic exercise;Balance training;Neuromuscular re-education;Patient/family education    PT Goals (Current goals can be found in the Care Plan section)  Acute Rehab PT Goals Patient Stated Goal: to get stronger and get home PT Goal Formulation: With patient/family Time For Goal Achievement: 03/07/18 Potential to Achieve Goals: Good    Frequency Min 2X/week   Barriers to discharge Decreased caregiver support has times with being home alone    Co-evaluation               AM-PAC PT "6 Clicks" Daily Activity  Outcome Measure Difficulty turning over in bed (including adjusting bedclothes, sheets and blankets)?: Unable Difficulty moving from lying on back to sitting on the side of the bed? : Unable Difficulty sitting down on and standing up from a chair with arms (e.g., wheelchair, bedside commode, etc,.)?: Unable Help needed moving to and from a bed to chair (including a wheelchair)?: A Lot Help needed walking in hospital room?: A Lot Help needed climbing 3-5 steps with a railing? : Total 6 Click Score: 8    End of Session Equipment Utilized During Treatment: Gait belt Activity Tolerance: Patient limited by fatigue;Treatment limited secondary to medical complications (Comment)(weakness in LE's) Patient left: in chair;with call bell/phone within reach;with nursing/sitter in room;with family/visitor present Nurse Communication: Mobility status PT Visit Diagnosis: Unsteadiness on feet (R26.81);Muscle weakness (generalized) (M62.81);Difficulty  in walking, not elsewhere classified (R26.2);History of falling (Z91.81);Adult, failure to thrive (R62.7)    Time: 1425-1450 PT Time Calculation (min) (ACUTE ONLY): 25 min   Charges:   PT Evaluation $PT Eval Moderate Complexity: 1 Mod PT Treatments $Gait Training: 8-22 mins   PT G Codes:   PT G-Codes **NOT FOR INPATIENT CLASS** Functional Assessment Tool Used: AM-PAC 6 Clicks Basic Mobility    Ramond Dial 02/21/2018, 7:36 PM   Mee Hives, PT MS Acute Rehab Dept. Number: [place]

## 2018-02-22 ENCOUNTER — Inpatient Hospital Stay (HOSPITAL_COMMUNITY): Payer: PPO

## 2018-02-22 LAB — GLUCOSE, CAPILLARY: Glucose-Capillary: 100 mg/dL — ABNORMAL HIGH (ref 65–99)

## 2018-02-22 LAB — VITAMIN D 25 HYDROXY (VIT D DEFICIENCY, FRACTURES): Vit D, 25-Hydroxy: 44.7 ng/mL (ref 30.0–100.0)

## 2018-02-22 MED ORDER — AMOXICILLIN-POT CLAVULANATE 875-125 MG PO TABS
1.0000 | ORAL_TABLET | Freq: Two times a day (BID) | ORAL | Status: DC
  Administered 2018-02-22 – 2018-02-24 (×4): 1 via ORAL
  Filled 2018-02-22 (×4): qty 1

## 2018-02-22 MED ORDER — HYDRALAZINE HCL 20 MG/ML IJ SOLN
10.0000 mg | Freq: Three times a day (TID) | INTRAMUSCULAR | Status: DC | PRN
  Administered 2018-02-23: 10 mg via INTRAVENOUS
  Filled 2018-02-22: qty 1

## 2018-02-22 MED ORDER — AMLODIPINE BESYLATE 5 MG PO TABS
5.0000 mg | ORAL_TABLET | Freq: Every day | ORAL | Status: DC
  Administered 2018-02-22 – 2018-02-24 (×3): 5 mg via ORAL
  Filled 2018-02-22 (×3): qty 1

## 2018-02-22 MED ORDER — VITAMIN D (ERGOCALCIFEROL) 1.25 MG (50000 UNIT) PO CAPS
50000.0000 [IU] | ORAL_CAPSULE | ORAL | Status: DC
  Administered 2018-02-22: 50000 [IU] via ORAL
  Filled 2018-02-22: qty 1

## 2018-02-22 MED ORDER — SACCHAROMYCES BOULARDII 250 MG PO CAPS
250.0000 mg | ORAL_CAPSULE | Freq: Two times a day (BID) | ORAL | Status: DC
  Administered 2018-02-22 – 2018-02-24 (×5): 250 mg via ORAL
  Filled 2018-02-22 (×5): qty 1

## 2018-02-22 NOTE — Progress Notes (Signed)
Pt BP 170/80, MAP 110, Pulse 78. Paged [place] NP. Awaiting new orders, will continue to monitor.

## 2018-02-22 NOTE — Progress Notes (Signed)
Patient oriented and following commands. Safety sitter no longer needed and discontinued.

## 2018-02-22 NOTE — Progress Notes (Signed)
PROGRESS NOTE    Samantha Woods  JEH:[id number] DOB: [birth year] DOA: 02/19/2018 PCP: Janora Norlander, DO    Brief Narrative: Samantha Woods is a 84 y.o. female with medical history significant of seizure newly dx in 09/2017 admission (she was found to have Klebsiella UTI at the time), hypertension, hypothyroidism, GERD. Pt presented to ED today after being found on bedroom floor by son. Unable to obtain any hx from pt as she received IV Ativan for being combative in CT on arrival. Son states he heard pt get up to use the restroom around 0400. At [place] his wife woke up to find water running in the bathroom and found pt on floor of bedroom, face down and with heavy breathing. Per ED notes, pt nonverbal on arrival with right sided gaze and left hemiparesis. These symptoms improved prior to receiving Ativan. On my assessment, she remains nonverbal and pushing me off when I attempt to examine. No focal deficits noted, but very difficult to assess. Of note, Keppra dose recently decreased on 01/31/18 by neurologist because son mentioned she might be more tired than usual since being on medication.    ED Course: CT with no acute findings and chronic changes r/t age. Again, IV Ativan given bc pt was combative in CT. Pt has been given IV Keppra loading dose per neurology recs   Assessment & Plan:   Principal Problem:   Acute encephalopathy Active Problems:   Hypothyroidism   Essential hypertension   Prediabetes   GERD (gastroesophageal reflux disease)   Unwitnessed fall   Seizure ([place])  1-Acute encephalopathy; suspect multifactorial related to medication , haldol vs infection vs recent seizure.  -she is lethargic, but would open eyes to voice, follows command and answer few questions.  -avoid sedatives as possible. Decreased haldol to 0.5 PRN.  -treat for aspiration PNA>  -CT head negative.  -patient is alert, in no acute distress.   2-Seizure; Loaded with Keppra. Keppra home dose increase  to 500 mg BID.  No focal weakness on exam today.  EEG; 1. Mild to moderate diffuse slowing of the waking background. Additional focal slowing over the right hemisphere She was still complaining of headaches. MRI was ordered and it was negative for stroke.     3-PNA, aspiration;  Some cough and increase upper airway secretion.  Chest X-ray ; Mild left basilar opacity is noted concerning for atelectasis with associated pleural effusion. Mild leukocytosis.  Received Unasyn for 3 days. Will provide 2 days of Augmentin.   4-Dehydration; decrease urine out put. Poor oral intake.continue with IV fluids.  Improved.   Muscle pain, weakness.  TSH  2.8, CK 172, Vitamin D 44. Will give vitamin D supplement. .  Suspect deconditioning.  Needs PT, rehab.  Tylenol for pain.   HTN; will start Norvasc.    DVT prophylaxis: Lovenox Code Status: DNR Family Communication: Son Who was at bedside.  Disposition Plan: needs SNF>    Consultants:   Neurology    Procedures:  EEG  Antimicrobials:  Unasyn 3-28  Subjective: She is alert, in no distress. Still complaining of headaches.  Some muscle pain.  Has had 2 BM. Added florastore.   Objective: Vitals:   02/22/18 0543 02/22/18 0600 02/22/18 1141 02/22/18 1234  BP: (!) 170/80 (!) 172/85 (!) 124/99 137/68  Pulse: 78     Resp: 18     Temp: 98.2 F (36.8 C)     TempSrc: Oral     SpO2: 98%     Weight:  Intake/Output Summary (Last 24 hours) at 02/22/2018 1615 Last data filed at 02/22/2018 1609 Gross per 24 hour  Intake 480 ml  Output 4775 ml  Net -4295 ml   Filed Weights   02/19/18 0843  Weight: 64.9 kg (143 lb)    Examination:  General exam: NAD Respiratory system: CTA Cardiovascular system: S 1, S 2 RRR Gastrointestinal system: BS present, soft,nt.  Central nervous system: alert , non focal.  Extremities: symmetric power.  Skin: No rash    Data Reviewed: I have personally reviewed following labs and imaging  studies  CBC: Recent Labs  Lab 02/19/18 0847 02/19/18 0903 02/20/18 0438  WBC 12.2*  --  11.9*  NEUTROABS 10.1*  --   --   HGB 15.2* 16.0* 13.1  HCT 45.7 47.0* 41.8  MCV 93.1  --  95.7  PLT 271  --  502   Basic Metabolic Panel: Recent Labs  Lab 02/19/18 0847 02/19/18 0903 02/20/18 0438  NA 139 141 141  K 4.0 4.0 3.9  CL 103 102 104  CO2 21*  --  26  GLUCOSE 199* 198* 96  BUN 14 19 18   CREATININE 0.92 0.80 0.84  CALCIUM 9.6  --  9.4   GFR: Estimated Creatinine Clearance: 32.8 mL/min (by C-G formula based on SCr of 0.84 mg/dL). Liver Function Tests: Recent Labs  Lab 02/19/18 0847  AST 41  ALT 20  ALKPHOS 155*  BILITOT 1.0  PROT 7.2  ALBUMIN 4.1   No results for input(s): LIPASE, AMYLASE in the last 168 hours. No results for input(s): AMMONIA in the last 168 hours. Coagulation Profile: Recent Labs  Lab 02/19/18 1042  INR 1.05   Cardiac Enzymes: Recent Labs  Lab 02/19/18 0847 02/21/18 1433  CKTOTAL 61 172  CKMB 2.0  --    BNP (last 3 results) No results for input(s): PROBNP in the last 8760 hours. HbA1C: Recent Labs    02/19/18 1906  HGBA1C 5.4   CBG: Recent Labs  Lab 02/20/18 0747 02/20/18 0852 02/21/18 0805 02/22/18 0816  GLUCAP 95 93 79 100*   Lipid Profile: No results for input(s): CHOL, HDL, LDLCALC, TRIG, CHOLHDL, LDLDIRECT in the last 72 hours. Thyroid Function Tests: Recent Labs    02/21/18 1433  TSH 2.814   Anemia Panel: No results for input(s): VITAMINB12, FOLATE, FERRITIN, TIBC, IRON, RETICCTPCT in the last 72 hours. Sepsis Labs: No results for input(s): PROCALCITON, LATICACIDVEN in the last 168 hours.  Recent Results (from the past 240 hour(s))  MRSA PCR Screening     Status: None   Collection Time: 02/19/18 10:01 PM  Result Value Ref Range Status   MRSA by PCR NEGATIVE NEGATIVE Final    Comment:        The GeneXpert MRSA Assay (FDA approved for NASAL specimens only), is one component of a comprehensive MRSA  colonization surveillance program. It is not intended to diagnose MRSA infection nor to guide or monitor treatment for MRSA infections. Performed at [facility], [place] [address] [place], [place] [address]   Urine culture     Status: Abnormal   Collection Time: 02/19/18 11:46 PM  Result Value Ref Range Status   Specimen Description URINE, CATHETERIZED  Final   Special Requests NONE  Final   Culture (A)  Final    <10,000 COLONIES/mL INSIGNIFICANT GROWTH Performed at [facility], [place] [address], [place], [place] [address]    Report Status 02/21/2018 FINAL  Final  Radiology Studies: Mr Brain Wo Contrast  Result Date: 02/22/2018 CLINICAL DATA:  Headache, acute. Normal neuro exam. Found on bedroom floor by son. EXAM: MRI HEAD WITHOUT CONTRAST TECHNIQUE: Multiplanar, multiecho pulse sequences of the brain and surrounding structures were obtained without intravenous contrast. COMPARISON:  Head CT from 3 days ago.  Brain MRI 09/19/2017 FINDINGS: Brain: No acute infarction, hemorrhage, hydrocephalus, extra-axial collection or mass lesion. Chronic small vessel ischemia with mild confluent periventricular FLAIR hyperintensity. Age normal brain volume. Vascular: Major flow voids are preserved Skull and upper cervical spine: No evidence of marrow lesion Sinuses/Orbits: Bilateral cataract resection.  No acute finding. IMPRESSION: Senescent changes without acute finding or change from 2018. Electronically Signed   By: Monte Fantasia M.D.   On: 02/22/2018 13:40   Dg Hips Bilat With Pelvis 3-4 Views  Result Date: 02/21/2018 CLINICAL DATA:  Fall after seizure EXAM: DG HIP (WITH OR WITHOUT PELVIS) 3-4V BILAT COMPARISON:  09/23/2017 FINDINGS: Pubic symphysis and rami are intact. Multiple calcified phleboliths. SI joint degenerative changes. No acute displaced fracture or malalignment. IMPRESSION: No acute osseous abnormality. Electronically Signed   By: Donavan Foil M.D.   On:  02/21/2018 15:22        Scheduled Meds: . acetaminophen  650 mg Oral BID  . amLODipine  5 mg Oral Daily  . amoxicillin-clavulanate  1 tablet Oral Q12H  . enoxaparin (LOVENOX) injection  40 mg Subcutaneous Q24H  . levothyroxine  75 mcg Oral QAC breakfast  . pantoprazole  40 mg Oral Daily  . saccharomyces boulardii  250 mg Oral BID  . Vitamin D (Ergocalciferol)  50,000 Units Oral Q7 days   Continuous Infusions: . sodium chloride 100 mL/hr at 02/22/18 0756  . levETIRAcetam Stopped (02/22/18 0946)     LOS: 2 days    Time spent: 35 minutes.     Elmarie Shiley, MD [facility] Pager [phone]  If 7PM-7AM, please contact night-coverage [email or web address] Password [email or web address] 02/22/2018, 4:15 PM

## 2018-02-23 ENCOUNTER — Inpatient Hospital Stay (HOSPITAL_COMMUNITY): Payer: PPO

## 2018-02-23 LAB — CBC
HCT: 42.6 % (ref 36.0–46.0)
Hemoglobin: 13.7 g/dL (ref 12.0–15.0)
MCH: 29.7 pg (ref 26.0–34.0)
MCHC: 32.2 g/dL (ref 30.0–36.0)
MCV: 92.4 fL (ref 78.0–100.0)
PLATELETS: 274 10*3/uL (ref 150–400)
RBC: 4.61 MIL/uL (ref 3.87–5.11)
RDW: 14.1 % (ref 11.5–15.5)
WBC: 10.6 10*3/uL — ABNORMAL HIGH (ref 4.0–10.5)

## 2018-02-23 LAB — BASIC METABOLIC PANEL
Anion gap: 11 (ref 5–15)
BUN: 5 mg/dL — AB (ref 6–20)
CALCIUM: 9 mg/dL (ref 8.9–10.3)
CO2: 27 mmol/L (ref 22–32)
CREATININE: 0.78 mg/dL (ref 0.44–1.00)
Chloride: 101 mmol/L (ref 101–111)
GFR calc non Af Amer: >60 mL/min (ref >60)
Glucose, Bld: 92 mg/dL (ref 65–99)
Potassium: 2.8 mmol/L — ABNORMAL LOW (ref 3.5–5.1)
SODIUM: 139 mmol/L (ref 135–145)

## 2018-02-23 LAB — GLUCOSE, CAPILLARY: Glucose-Capillary: 101 mg/dL — ABNORMAL HIGH (ref 65–99)

## 2018-02-23 MED ORDER — DICLOFENAC SODIUM 1 % TD GEL
2.0000 g | Freq: Four times a day (QID) | TRANSDERMAL | Status: DC
  Administered 2018-02-23 – 2018-02-24 (×5): 2 g via TOPICAL
  Filled 2018-02-23: qty 100

## 2018-02-23 MED ORDER — LEVETIRACETAM 500 MG PO TABS
500.0000 mg | ORAL_TABLET | Freq: Two times a day (BID) | ORAL | Status: DC
  Administered 2018-02-23 – 2018-02-24 (×2): 500 mg via ORAL
  Filled 2018-02-23 (×2): qty 1

## 2018-02-23 MED ORDER — POTASSIUM CHLORIDE 10 MEQ/100ML IV SOLN: 10.0000 meq | INTRAVENOUS | Status: DC

## 2018-02-23 MED ORDER — HYDRALAZINE HCL 20 MG/ML IJ SOLN: 10.0000 mg | Freq: Three times a day (TID) | INTRAMUSCULAR | Status: DC | PRN

## 2018-02-23 MED ORDER — POTASSIUM CHLORIDE CRYS ER 20 MEQ PO TBCR
40.0000 meq | EXTENDED_RELEASE_TABLET | Freq: Once | ORAL | Status: AC
  Administered 2018-02-23: 40 meq via ORAL
  Filled 2018-02-23: qty 2

## 2018-02-23 MED ORDER — POTASSIUM CHLORIDE 10 MEQ/100ML IV SOLN
10.0000 meq | INTRAVENOUS | Status: AC
  Administered 2018-02-23 (×2): 10 meq via INTRAVENOUS
  Filled 2018-02-23 (×2): qty 100

## 2018-02-23 MED ORDER — BACITRACIN-NEOMYCIN-POLYMYXIN OINTMENT TUBE
TOPICAL_OINTMENT | Freq: Two times a day (BID) | CUTANEOUS | Status: DC
  Administered 2018-02-23 – 2018-02-24 (×3): via TOPICAL
  Filled 2018-02-23 (×2): qty 14.17

## 2018-02-23 MED ORDER — POTASSIUM CHLORIDE CRYS ER 20 MEQ PO TBCR: 20.0000 meq | EXTENDED_RELEASE_TABLET | Freq: Once | ORAL | Status: DC

## 2018-02-23 NOTE — Clinical Social Work Note (Signed)
Clinical Social Work Assessment  Patient Details  Name: Samantha Woods MRN: [id number] Date of Birth: August 11, 1921  Date of referral:  02/23/18               Reason for consult:  Facility Placement                Permission sought to share information with:  Facility Sport and exercise psychologist, Family Supports Permission granted to share information::  Yes, Verbal Permission Granted  Name::     Pensions consultant::  yes  Relationship::  yes  Contact Information:  yes  Housing/Transportation Living arrangements for the past 2 months:  [place] of Information:  Patient, Adult Children Patient Interpreter Needed:  None Criminal Activity/Legal Involvement Pertinent to Current Situation/Hospitalization:  No - Comment as needed Significant Relationships:  Adult Children, Other Family Members Lives with:  Adult Children Do you feel safe going back to the place where you live?  Yes Need for family participation in patient care:  Yes (Comment)  Care giving concerns:  CSW received consult regarding SNF placement.  CSW spoke with patient and son.  Patient resides at home with her son and daughter in law. Patient and Patient's son would like for patient to reside at [facility] until patient completes physical therapy.   Social Worker assessment / plan:  CSW spoke with patient and patient's son regarding SNF placement. Patient and patient's son are agreeable for placement.  Employment status:  Retired Nurse, adult PT Recommendations:  [place] / Referral to community resources:  (NA)  Patient/Family's Response to care:  Patient and patient's son reports agreement with discharge plan.  Patient/Family's Understanding of and Emotional Response to Diagnosis, Current Treatment, and Prognosis: Patient/family is realistic regarding therapy needs.  Patient and patient's son expressed understanding of CSW role and discharge process as  well as medical condition.  No questions/concerning about plan or treatment.  Emotional Assessment Appearance:  Appears stated age Attitude/Demeanor/Rapport:  Charismatic, Engaged, Gracious Affect (typically observed):  Accepting, Appropriate, Calm Orientation:  Oriented to Self, Oriented to Place, Oriented to  Time, Oriented to Situation Alcohol / Substance use:  Not Applicable Psych involvement (Current and /or in the community):  No (Comment)  Discharge Needs  Concerns to be addressed:  No discharge needs identified Readmission within the last 30 days:  No Current discharge risk:  None Barriers to Discharge:  No Barriers Identified   Carolin Sicks, [place] 02/23/2018, 12:41 PM

## 2018-02-23 NOTE — Progress Notes (Signed)
Patient refused to recheck Blood pressure after given hydralazine.

## 2018-02-23 NOTE — Progress Notes (Signed)
PROGRESS NOTE    Samantha Woods  OIN:[id number] DOB: 07/14/1921 DOA: 02/19/2018 PCP: Janora Norlander, DO    Brief Narrative: Samantha Woods is a 84 y.o. female with medical history significant of seizure newly dx in 09/2017 admission (she was found to have Klebsiella UTI at the time), hypertension, hypothyroidism, GERD. Pt presented to ED today after being found on bedroom floor by son. Unable to obtain any hx from pt as she received IV Ativan for being combative in CT on arrival. Son states he heard pt get up to use the restroom around 0400. At [place] his wife woke up to find water running in the bathroom and found pt on floor of bedroom, face down and with heavy breathing. Per ED notes, pt nonverbal on arrival with right sided gaze and left hemiparesis. These symptoms improved prior to receiving Ativan. On my assessment, she remains nonverbal and pushing me off when I attempt to examine. No focal deficits noted, but very difficult to assess. Of note, Keppra dose recently decreased on 01/31/18 by neurologist because son mentioned she might be more tired than usual since being on medication.    ED Course: CT with no acute findings and chronic changes r/t age. Again, IV Ativan given bc pt was combative in CT. Pt has been given IV Keppra loading dose per neurology recs   Assessment & Plan:   Principal Problem:   Acute encephalopathy Active Problems:   Hypothyroidism   Essential hypertension   Prediabetes   GERD (gastroesophageal reflux disease)   Unwitnessed fall   Seizure ([place])  1-Acute encephalopathy; suspect multifactorial related to medication , haldol vs infection vs recent seizure.  -she was  Lethargic on admission , but would open eyes to voice, follows command and answer few questions.  -Avoid sedatives as possible. Decreased haldol to 0.5 PRN.  -treat for aspiration PNA>  -CT head negative.  -Patient had some confusion earlier this am, and some hallucination. And complaints  of some headaches. Her neuro exam is non focal. Her BP was elevated earlier today. Might be related to that. She was started on norvasc and has PRN hydralazine. She was alert and oriented on my evaluation, she was able to tell me about seen water in the floor. She had MRI yesterday negative for stroke. I will check CBC, and electrolytes. Will continue to do neuro check. No hypoxic.   2-Seizure; Loaded with Keppra. Keppra home dose increase to 500 mg BID.  No focal weakness on exam today.  EEG; 1. Mild to moderate diffuse slowing of the waking background. Additional focal slowing over the right hemisphere She was still complaining of headaches. MRI was ordered and it was negative for stroke.    3-PNA, aspiration;  Some cough and increase upper airway secretion.  Chest X-ray ; Mild left basilar opacity is noted concerning for atelectasis with associated pleural effusion. Mild leukocytosis.  Received Unasyn for 3 days. Will provide 2 days of Augmentin.  Follow CBC   4-Dehydration; Decrease urine out put. Poor oral intake.continue with IV fluids.  Improved.   Muscle pain, weakness.  TSH  2.8, CK 172, Vitamin D 44. Will give vitamin D supplement. .  Suspect deconditioning.  Needs PT, rehab.  Tylenol for pain.  Not on statins.   HTN; will start Norvasc.   Left knee swelling;  Will check x ray.  Voltaren gel.   DVT prophylaxis: Lovenox Code Status: DNR Family Communication: Son Who was at bedside.  Disposition Plan: needs SNF>  Consultants:   Neurology    Procedures:  EEG  Antimicrobials:  Unasyn 3-28  Subjective: Mild headaches, and left eye. No vision changes.  Was confuse earlier today.  Still complaining of muscle pain.  Son relates, patient yesterday morning wake up and said she was in heaven.   Objective: Vitals:   02/22/18 2216 02/23/18 0524 02/23/18 0543 02/23/18 0943  BP: 136/73 (!) 184/110 (!) 180/73 (!) 132/54  Pulse: 77 81    Resp:  20    Temp:  98  F (36.7 C)    TempSrc:      SpO2:  99%    Weight:        Intake/Output Summary (Last 24 hours) at 02/23/2018 1203 Last data filed at 02/23/2018 4481 Gross per 24 hour  Intake 150 ml  Output 4125 ml  Net -3975 ml   Filed Weights   02/19/18 0843  Weight: 64.9 kg (143 lb)    Examination:  General exam: NAD Respiratory system: CTA Cardiovascular system: S 1, S 2 RRR Gastrointestinal system: BS present, soft, nt Central nervous system: Alert non focal.  Extremities: Symmetric power.  Skin: No rash    Data Reviewed: I have personally reviewed following labs and imaging studies  CBC: Recent Labs  Lab 02/19/18 0847 02/19/18 0903 02/20/18 0438  WBC 12.2*  --  11.9*  NEUTROABS 10.1*  --   --   HGB 15.2* 16.0* 13.1  HCT 45.7 47.0* 41.8  MCV 93.1  --  95.7  PLT 271  --  856   Basic Metabolic Panel: Recent Labs  Lab 02/19/18 0847 02/19/18 0903 02/20/18 0438  NA 139 141 141  K 4.0 4.0 3.9  CL 103 102 104  CO2 21*  --  26  GLUCOSE 199* 198* 96  BUN 14 19 18   CREATININE 0.92 0.80 0.84  CALCIUM 9.6  --  9.4   GFR: Estimated Creatinine Clearance: 32.8 mL/min (by C-G formula based on SCr of 0.84 mg/dL). Liver Function Tests: Recent Labs  Lab 02/19/18 0847  AST 41  ALT 20  ALKPHOS 155*  BILITOT 1.0  PROT 7.2  ALBUMIN 4.1   No results for input(s): LIPASE, AMYLASE in the last 168 hours. No results for input(s): AMMONIA in the last 168 hours. Coagulation Profile: Recent Labs  Lab 02/19/18 1042  INR 1.05   Cardiac Enzymes: Recent Labs  Lab 02/19/18 0847 02/21/18 1433  CKTOTAL 61 172  CKMB 2.0  --    BNP (last 3 results) No results for input(s): PROBNP in the last 8760 hours. HbA1C: No results for input(s): HGBA1C in the last 72 hours. CBG: Recent Labs  Lab 02/20/18 0747 02/20/18 0852 02/21/18 0805 02/22/18 0816 02/23/18 0745  GLUCAP 95 93 79 100* 101*   Lipid Profile: No results for input(s): CHOL, HDL, LDLCALC, TRIG, CHOLHDL, LDLDIRECT  in the last 72 hours. Thyroid Function Tests: Recent Labs    02/21/18 1433  TSH 2.814   Anemia Panel: No results for input(s): VITAMINB12, FOLATE, FERRITIN, TIBC, IRON, RETICCTPCT in the last 72 hours. Sepsis Labs: No results for input(s): PROCALCITON, LATICACIDVEN in the last 168 hours.  Recent Results (from the past 240 hour(s))  MRSA PCR Screening     Status: None   Collection Time: 02/19/18 10:01 PM  Result Value Ref Range Status   MRSA by PCR NEGATIVE NEGATIVE Final    Comment:        The GeneXpert MRSA Assay (FDA approved for NASAL specimens only), is one component  of a comprehensive MRSA colonization surveillance program. It is not intended to diagnose MRSA infection nor to guide or monitor treatment for MRSA infections. Performed at [facility], [place] [address] [place], [place] [address]   Urine culture     Status: Abnormal   Collection Time: 02/19/18 11:46 PM  Result Value Ref Range Status   Specimen Description URINE, CATHETERIZED  Final   Special Requests NONE  Final   Culture (A)  Final    <10,000 COLONIES/mL INSIGNIFICANT GROWTH Performed at [facility], [place] [address], [place], [place] [address]    Report Status 02/21/2018 FINAL  Final         Radiology Studies: Mr Brain Wo Contrast  Result Date: 02/22/2018 CLINICAL DATA:  Headache, acute. Normal neuro exam. Found on bedroom floor by son. EXAM: MRI HEAD WITHOUT CONTRAST TECHNIQUE: Multiplanar, multiecho pulse sequences of the brain and surrounding structures were obtained without intravenous contrast. COMPARISON:  Head CT from 3 days ago.  Brain MRI 09/19/2017 FINDINGS: Brain: No acute infarction, hemorrhage, hydrocephalus, extra-axial collection or mass lesion. Chronic small vessel ischemia with mild confluent periventricular FLAIR hyperintensity. Age normal brain volume. Vascular: Major flow voids are preserved Skull and upper cervical spine: No evidence of marrow lesion Sinuses/Orbits:  Bilateral cataract resection.  No acute finding. IMPRESSION: Senescent changes without acute finding or change from 2018. Electronically Signed   By: Monte Fantasia M.D.   On: 02/22/2018 13:40   Dg Hips Bilat With Pelvis 3-4 Views  Result Date: 02/21/2018 CLINICAL DATA:  Fall after seizure EXAM: DG HIP (WITH OR WITHOUT PELVIS) 3-4V BILAT COMPARISON:  09/23/2017 FINDINGS: Pubic symphysis and rami are intact. Multiple calcified phleboliths. SI joint degenerative changes. No acute displaced fracture or malalignment. IMPRESSION: No acute osseous abnormality. Electronically Signed   By: Donavan Foil M.D.   On: 02/21/2018 15:22        Scheduled Meds: . acetaminophen  650 mg Oral BID  . amLODipine  5 mg Oral Daily  . amoxicillin-clavulanate  1 tablet Oral Q12H  . diclofenac sodium  2 g Topical QID  . enoxaparin (LOVENOX) injection  40 mg Subcutaneous Q24H  . levothyroxine  75 mcg Oral QAC breakfast  . pantoprazole  40 mg Oral Daily  . saccharomyces boulardii  250 mg Oral BID  . Vitamin D (Ergocalciferol)  50,000 Units Oral Q7 days   Continuous Infusions: . levETIRAcetam Stopped (02/23/18 1000)     LOS: 3 days    Time spent: 35 minutes.     Elmarie Shiley, MD [facility] Pager [phone]  If 7PM-7AM, please contact night-coverage [email or web address] Password [facility] 02/23/2018, 12:03 PM

## 2018-02-23 NOTE — Plan of Care (Signed)
Progressing

## 2018-02-23 NOTE — Evaluation (Signed)
Occupational Therapy Evaluation Patient Details Name: Samantha Woods MRN: [id number] DOB: 09/07/1921 Today's Date: 02/23/2018    History of Present Illness 84 yo female with onset of pleural effusion and AMS with seizure precautions after being found down by family, recent SNF stay from clavicular fracture.  Had a new seizure and now prolonged weakness from the post ictal state.  PMHx:  HTN, hypothyroidism, seizures,    Clinical Impression   PTA, pt was living with her son and was independent in ADLs and light IADLs including cook and cleaning tasks. Pt currently requiring Min A for LB ADLs, Min A for functional mobility with RW, and Mod A for toilet transfer. Pt presenting with generalized weakness and decreased activity tolerance. Pt reporting she feels fatigued after toileting. Pt with several LOB due to posterior lean during functional mobility and required Min A for correction of balance. Pt would benefit from further acute OT to facilitate safe dc. Recommend dc to SNF for further OT to optimize safety, independence with ADLs, and return to PLOF.      Follow Up Recommendations  SNF;Supervision/Assistance - 24 hour    Equipment Recommendations  Other (comment)(Defer to next venue)    Recommendations for Other Services PT consult     Precautions / Restrictions Precautions Precautions: Fall(telemtry) Precaution Comments: confusion and safety awareness is down Restrictions Weight Bearing Restrictions: No      Mobility Bed Mobility               General bed mobility comments: OOB in recliner upon arrival  Transfers Overall transfer level: Needs assistance Equipment used: Rolling walker (2 wheeled);1 person hand held assist Transfers: Sit to/from Stand Sit to Stand: Max assist;Mod assist         General transfer comment: max from commode and mod from chair    Balance Overall balance assessment: Needs assistance;History of Falls Sitting-balance support: Feet  supported;Bilateral upper extremity supported Sitting balance-Leahy Scale: Fair     Standing balance support: Bilateral upper extremity supported;During functional activity Standing balance-Leahy Scale: Poor                             ADL either performed or assessed with clinical judgement   ADL Overall ADL's : Needs assistance/impaired Eating/Feeding: Sitting;Set up Eating/Feeding Details (indicate cue type and reason): Upon arrival, pt in recliner eating lunch Grooming: Min guard;Wash/dry hands;Standing Grooming Details (indicate cue type and reason): Min Guard for safety due to posteior lean in standing Upper Body Bathing: Set up;Sitting;Supervision/ safety   Lower Body Bathing: Minimal assistance;Sit to/from stand Lower Body Bathing Details (indicate cue type and reason): Min A to correct posterior lean.  Upper Body Dressing : Set up;Sitting;Supervision/safety   Lower Body Dressing: Minimal assistance;Sit to/from stand Lower Body Dressing Details (indicate cue type and reason): Min A for balance in standing Toilet Transfer: Moderate assistance;Regular Toilet;Ambulation;RW;Grab bars Toilet Transfer Details (indicate cue type and reason): Pt requiring Mod A for safe descent and then Mod A to power up from regular height toilet. Pt reporting that she has BSC over toilet at hoem and doesnt have difficulty Toileting- Clothing Manipulation and Hygiene: Minimal assistance;Sit to/from stand Toileting - Clothing Manipulation Details (indicate cue type and reason): Min A to manage clothing while pt performed peri care     Functional mobility during ADLs: Minimal assistance;Rolling walker(several LOB requiring Min A to correct) General ADL Comments: Pt presenting with decreased fucntional performance. Pt with posterior lean during functional  mobility and had several LOB which required Min A to correction.      Vision Baseline Vision/History: Wears glasses Wears Glasses: At  all times Patient Visual Report: No change from baseline       Perception     Praxis      Pertinent Vitals/Pain Pain Assessment: No/denies pain Faces Pain Scale: No hurt     Hand Dominance Right   Extremity/Trunk Assessment Upper Extremity Assessment Upper Extremity Assessment: Generalized weakness   Lower Extremity Assessment Lower Extremity Assessment: Generalized weakness;LLE deficits/detail LLE Deficits / Details: Recent edema in left knee.   Cervical / Trunk Assessment Cervical / Trunk Assessment: Kyphotic   Communication Communication Communication: HOH   Cognition Arousal/Alertness: Awake/alert Behavior During Therapy: WFL for tasks assessed/performed Overall Cognitive Status: Within Functional Limits for tasks assessed                                 General Comments: Able to follow cues and discuss dc plan with OT and son. Very pleasant   General Comments  Son present during session    Exercises Exercises: Other exercises(LE strength is 4 to 4+ BLE's )   Shoulder Instructions      Home Living Family/patient expects to be discharged to:: Skilled nursing facility Living Arrangements: Children(Son) Available Help at Discharge: Family;Available PRN/intermittently Type of Home: House Home Access: Ramped entrance     Home Layout: One level     Bathroom Shower/Tub: Occupational psychologist: Standard(BSC over toilet)     Home Equipment: Cane - single point;Shower seat - built in;Walker - 2 wheels;Bedside commode   Additional Comments: son is with pt but leaves for up to 2 hours at a time, but pt was walking independently on her RW      Prior Functioning/Environment Level of Independence: Independent with assistive device(s)        Comments: Ambulating with SPC. Performing ADLs and IADLs. States "I love to be busy and do chores"        OT Problem List: Decreased strength;Decreased range of motion;Decreased activity  tolerance;Impaired balance (sitting and/or standing);Decreased safety awareness;Decreased knowledge of use of DME or AE;Decreased knowledge of precautions      OT Treatment/Interventions: Self-care/ADL training;Therapeutic exercise;Energy conservation;DME and/or AE instruction;Therapeutic activities;Patient/family education    OT Goals(Current goals can be found in the care plan section) Acute Rehab OT Goals Patient Stated Goal: to get stronger and get home OT Goal Formulation: With patient Time For Goal Achievement: 03/09/18 Potential to Achieve Goals: Good ADL Goals Pt Will Perform Grooming: with set-up;with supervision;standing Pt Will Perform Upper Body Dressing: with set-up;with supervision;sitting Pt Will Perform Lower Body Dressing: with set-up;with supervision;sit to/from stand Pt Will Transfer to Toilet: with set-up;with supervision;ambulating;bedside commode Pt Will Perform Toileting - Clothing Manipulation and hygiene: with set-up;with supervision;sit to/from stand  OT Frequency: Min 2X/week   Barriers to D/C:            Co-evaluation              AM-PAC PT "6 Clicks" Daily Activity     Outcome Measure Help from another person eating meals?: None Help from another person taking care of personal grooming?: A Little Help from another person toileting, which includes using toliet, bedpan, or urinal?: A Little Help from another person bathing (including washing, rinsing, drying)?: A Little Help from another person to put on and taking off regular upper body clothing?:  A Little Help from another person to put on and taking off regular lower body clothing?: A Little 6 Click Score: 19   End of Session Equipment Utilized During Treatment: Gait belt;Rolling walker Nurse Communication: Mobility status  Activity Tolerance: Patient tolerated treatment well Patient left: in chair;with call bell/phone within reach;with chair alarm set  OT Visit Diagnosis: Unsteadiness on  feet (R26.81);Other abnormalities of gait and mobility (R26.89);Muscle weakness (generalized) (M62.81)                Time: 9470-9628 OT Time Calculation (min): 28 min Charges:  OT General Charges $OT Visit: 1 Visit OT Evaluation $OT Eval Moderate Complexity: 1 Mod OT Treatments $Self Care/Home Management : 8-22 mins G-Codes:     Suzann Lazaro MSOT, OTR/L Acute Rehab Pager: [phone] Office: [place] 02/23/2018, 2:12 PM

## 2018-02-23 NOTE — NC FL2 (Addendum)
[place] MEDICAID FL2 LEVEL OF CARE SCREENING TOOL     IDENTIFICATION  Patient Name: Samantha Woods Birthdate: 1921-02-06 Sex: female Admission Date (Current Location): 02/19/2018  [facility] and [place] Number:  Herbalist and Address:  [facility], [place] [address] [place], [place] [address]      Provider Number: [id number]  Attending Physician Name and Address:  Elmarie Shiley, MD  Relative Name and Phone Number:  [name] [phone]    Current Level of Care: SNF Recommended Level of Care:   Prior Approval Number:    Date Approved/Denied:   PASRR Number:   [id number]  Discharge Plan: SNF    Current Diagnoses: Patient Active Problem List   Diagnosis Date Noted  . Seizure ([place]) 02/19/2018  . Traumatic closed fracture of distal clavicle with minimal displacement, right, initial encounter 09/22/2017  . Unwitnessed fall 09/22/2017  . Seizure disorder ([place]) 09/18/2017  . Acute encephalopathy 09/18/2017  . Musculoskeletal pain 09/18/2017  . GERD (gastroesophageal reflux disease) 09/18/2017  . Cystocele, midline 08/30/2017  . Tricompartmental disease of knee 08/30/2017  . Hypothyroidism 01/23/2015  . Gastroesophageal reflux disease with esophagitis 01/23/2015  . Essential hypertension 01/23/2015  . Degenerative arthritis of spine 01/23/2015  . Prediabetes 01/23/2015    Orientation RESPIRATION BLADDER Height & Weight     Self, Time, Situation, Place  Normal Continent Weight: 143 lb (64.9 kg) Height:     BEHAVIORAL SYMPTOMS/MOOD NEUROLOGICAL BOWEL NUTRITION STATUS    Convulsions/Seizures Continent Diet(dys 3 mechanical soft)  AMBULATORY STATUS COMMUNICATION OF NEEDS Skin   Extensive Assist Verbally Normal                       Personal Care Assistance Level of Assistance  Bathing, Dressing Bathing Assistance: Limited assistance   Dressing Assistance: Limited assistance     Functional Limitations Info  Hearing    Hearing Info: Impaired(both ears)      SPECIAL CARE FACTORS FREQUENCY  PT (By licensed PT)     PT Frequency: 2x week              Contractures Contractures Info: Not present    Additional Factors Info  Code Status, Allergies Code Status Info: DNR Allergies Info: sulfa anibiotics,lorazepan,darvon           Current Medications (02/23/2018):  This is the current hospital active medication list Current Facility-Administered Medications  Medication Dose Route Frequency Provider Last Rate Last Dose  . acetaminophen (TYLENOL) tablet 650 mg  650 mg Oral Q6H PRN Dionne Milo, NP   650 mg at 02/23/18 0602   Or  . acetaminophen (TYLENOL) suppository 650 mg  650 mg Rectal Q6H PRN Dionne Milo, NP      . acetaminophen (TYLENOL) tablet 650 mg  650 mg Oral BID Regalado, Belkys A, MD   650 mg at 02/23/18 0943  . amLODipine (NORVASC) tablet 5 mg  5 mg Oral Daily Regalado, Belkys A, MD   5 mg at 02/23/18 0943  . amoxicillin-clavulanate (AUGMENTIN) 875-125 MG per tablet 1 tablet  1 tablet Oral Q12H Regalado, Belkys A, MD   1 tablet at 02/23/18 0943  . diclofenac sodium (VOLTAREN) 1 % transdermal gel 2 g  2 g Topical QID Regalado, Belkys A, MD      . enoxaparin (LOVENOX) injection 40 mg  40 mg Subcutaneous Q24H Dionne Milo, NP   40 mg at 02/22/18 1705  . haloperidol lactate (HALDOL) injection 0.5  mg  0.5 mg Intravenous Q6H PRN Regalado, Belkys A, MD      . hydrALAZINE (APRESOLINE) injection 10 mg  10 mg Intravenous Q8H PRN Regalado, Belkys A, MD      . levETIRAcetam (KEPPRA) IVPB 500 mg/100 mL premix  500 mg Intravenous Q12H Dionne Milo, NP   Stopped at 02/23/18 1000  . levothyroxine (SYNTHROID, LEVOTHROID) tablet 75 mcg  75 mcg Oral QAC breakfast Dionne Milo, NP   75 mcg at 02/23/18 0736  . ondansetron (ZOFRAN) tablet 4 mg  4 mg Oral Q6H PRN Dionne Milo, NP       Or  . ondansetron [facility]) injection 4 mg  4 mg Intravenous Q6H PRN Dionne Milo, NP      . pantoprazole (PROTONIX) EC tablet 40 mg  40 mg Oral Daily Dionne Milo, NP   40 mg at 02/23/18 0943  . polyethylene glycol (MIRALAX / GLYCOLAX) packet 17 g  17 g Oral Daily PRN Dionne Milo, NP      . saccharomyces boulardii (FLORASTOR) capsule 250 mg  250 mg Oral BID Regalado, Belkys A, MD   250 mg at 02/23/18 0943  . Vitamin D (Ergocalciferol) (DRISDOL) capsule 50,000 Units  50,000 Units Oral Q7 days Niel Hummer A, MD   50,000 Units at 02/22/18 2876     Discharge Medications: Please see discharge summary for a list of discharge medications.  Relevant Imaging Results:  Relevant Lab Results:   Additional Information ss# [id number]  Carolin Sicks, [place]

## 2018-02-24 LAB — BASIC METABOLIC PANEL
Anion gap: 7 (ref 5–15)
BUN: 6 mg/dL (ref 6–20)
CALCIUM: 9.1 mg/dL (ref 8.9–10.3)
CHLORIDE: 107 mmol/L (ref 101–111)
CO2: 26 mmol/L (ref 22–32)
CREATININE: 0.76 mg/dL (ref 0.44–1.00)
GFR calc non Af Amer: >60 mL/min (ref >60)
Glucose, Bld: 96 mg/dL (ref 65–99)
Potassium: 3.9 mmol/L (ref 3.5–5.1)
SODIUM: 140 mmol/L (ref 135–145)

## 2018-02-24 LAB — GLUCOSE, CAPILLARY: Glucose-Capillary: 93 mg/dL (ref 65–99)

## 2018-02-24 LAB — PHOSPHORUS: PHOSPHORUS: 3.9 mg/dL (ref 2.5–4.6)

## 2018-02-24 LAB — MAGNESIUM: MAGNESIUM: 1.6 mg/dL — AB (ref 1.7–2.4)

## 2018-02-24 MED ORDER — AMOXICILLIN-POT CLAVULANATE 875-125 MG PO TABS: 1.0000 | ORAL_TABLET | Freq: Two times a day (BID) | ORAL | 0 refills | Status: AC

## 2018-02-24 MED ORDER — LEVETIRACETAM 500 MG PO TABS: 500.0000 mg | ORAL_TABLET | Freq: Two times a day (BID) | ORAL | 0 refills | Status: DC

## 2018-02-24 MED ORDER — VITAMIN D (ERGOCALCIFEROL) 1.25 MG (50000 UNIT) PO CAPS: 50000.0000 [IU] | ORAL_CAPSULE | ORAL | 0 refills | Status: DC

## 2018-02-24 MED ORDER — ACETAMINOPHEN 325 MG PO TABS: 650.0000 mg | ORAL_TABLET | Freq: Two times a day (BID) | ORAL | 0 refills | Status: DC

## 2018-02-24 MED ORDER — MAGNESIUM 200 MG PO TABS: 200.0000 mg | ORAL_TABLET | Freq: Two times a day (BID) | ORAL | 0 refills | Status: DC

## 2018-02-24 MED ORDER — DICLOFENAC SODIUM 1 % TD GEL: 2.0000 g | Freq: Four times a day (QID) | TRANSDERMAL | 0 refills | Status: DC

## 2018-02-24 MED ORDER — SACCHAROMYCES BOULARDII 250 MG PO CAPS: 250.0000 mg | ORAL_CAPSULE | Freq: Two times a day (BID) | ORAL | 0 refills | Status: DC

## 2018-02-24 MED ORDER — AMLODIPINE BESYLATE 5 MG PO TABS: 5.0000 mg | ORAL_TABLET | Freq: Every day | ORAL | 0 refills | Status: DC

## 2018-02-24 MED ORDER — MAGNESIUM SULFATE 2 GM/50ML IV SOLN
2.0000 g | Freq: Once | INTRAVENOUS | Status: AC
  Administered 2018-02-24: 2 g via INTRAVENOUS
  Filled 2018-02-24: qty 50

## 2018-02-24 NOTE — Progress Notes (Signed)
[employer] provided authorization.   Percell Locus Adarsh Mundorf LCSW [phone]

## 2018-02-24 NOTE — Progress Notes (Signed)
Samantha Woods to be D/C'd to [facility] per MD order. Report called to Caryl Asp, Therapist, sports. and intact without evidence of skin break down, no evidence of skin tears noted.  IV catheters discontinued intact. Site without signs and symptoms of complications. Dressing and pressure applied.  An After Visit Summary and prescriptions were printed and given to [facility].  Patient escorted via stretcher, and D/C to [facility] via [facility].  Melonie [place]  02/24/2018 3:26 PM

## 2018-02-24 NOTE — Progress Notes (Signed)
Patient will DC to: [facility] Anticipated DC date: 02/24/18 Family notified: Son Transport by: Corey Harold   Per MD patient ready for DC to [facility]. RN, patient, patient's family, and facility notified of DC. Discharge Summary sent to facility. RN given number for report [phone]). DC packet on chart. Ambulance transport requested for patient.   CSW signing off.  Cedric Fishman, LCSW Clinical Social Worker [phone]

## 2018-02-24 NOTE — Consult Note (Addendum)
   [facility] CM Inpatient Consult   02/24/2018  Samantha Woods Apr 09, 1921 [id number]  Patient screened for potential [place] Management services. Patient is in the [place] [facility] [place] [facility] services under patient's [employer] plan.  Patient admitted with Seizure and pneumonia.  Patient was found down at home. Spoke with inpatient RNCM, Levada Dy regarding patient's disposition and currently she states that they are planning for skilled nursing facility.  Chart review reveals the patient/family awaiting for [facility] per CSW notes, at this time. Spoke with the patient and son, [name], at the bedside.  Son confirms that the patient's primary care provider is Ronnie Doss, DO at [facility].  Explained [facility] Management services for post hospital follow up. He did accept the brochure, 24 hour nurse advise line and contact information. He states, "I will call if we need additional help if she returns home."    For questions contact:   Natividad Brood, RN BSN [facility] Liaison  [phone] business mobile phone Toll free office [phone]

## 2018-02-24 NOTE — Discharge Summary (Signed)
Physician Discharge Summary  Samantha Woods HUD:[id number] DOB: 1921-05-12 DOA: 02/19/2018  PCP: Janora Norlander, DO  Admit date: 02/19/2018 Discharge date: 02/24/2018  Admitted From: Home  Disposition:  SNF  Recommendations for Outpatient Follow-up:  1. Follow up with PCP in 1-2 weeks 2. Please obtain BMP/CBC in 3 days.  3. Needs assistance with ambulation, and need to work with PT>  4. Check Mg level/      Discharge Condition: Stable.  CODE STATUS: DNR Diet recommendation: Heart Healthy  Brief/Interim Summary:  Brief Narrative: Samantha Woods a 84 y.o.femalewithwith medical history significant ofseizure newly dx in 09/2017 admission (she was found to have Klebsiella UTI at the time), hypertension, hypothyroidism, GERD. Pt presented to ED today after being found on bedroom floor by son. Unable to obtain any hx from pt as she received IV Ativan for being combative in CT on arrival. Son states he heard pt get up to use the restroom around 0400. At [place] his wife woke up to find water running in the bathroom and found pt on floor of bedroom, face down and with heavy breathing. Per ED notes, pt nonverbal on arrival with right sided gaze and left hemiparesis. These symptoms improved prior to receiving Ativan. On my assessment, she remains nonverbal and pushing me off when I attempt to examine. No focal deficits noted, but very difficult to assess.Of note, Keppra dose recently decreased on 01/31/18 by neurologist because son mentioned she might be more tired than usual since being on medication.   ED Course:CT with no acute findings and chronic changes r/t age. Again, IV Ativan given bc pt was combative in CT. Pt has been given IV Keppra loading dose per neurology recs   Assessment & Plan:   Principal Problem:   Acute encephalopathy Active Problems:   Hypothyroidism   Essential hypertension   Prediabetes   GERD (gastroesophageal reflux disease)   Unwitnessed fall   Seizure  ([place])  1-Acute encephalopathy; suspect multifactorial related to medication , haldol vs infection vs recent seizure.  -she was  Lethargic on admission , but would open eyes to voice, follows command and answer few questions.  -Avoid sedatives as possible. Decreased haldol to 0.5 PRN.  -treat for aspiration PNA>  -CT head negative.  - She had MRI which was  negative for stroke.  -confusion improved. BP improved.   2-Seizure; Loaded with Keppra. Keppra home dose increase to 500 mg BID.  No focal weakness on exam today.  EEG; 1. Mild to moderatediffuse slowing of the waking background. Additional focal slowing over the right hemisphere She was still complaining of headaches. MRI was ordered and it was negative for stroke.  Stable.   3-PNA, aspiration;  Some cough and increase upper airway secretion.  Chest X-ray ; Mild left basilar opacity is noted concerning for atelectasis with associated pleural effusion. Mild leukocytosis. Improving.  Received Unasyn for 3 days.  Day 2 augmentin. One more day of antibiotics.    4-Dehydration; Decrease urine out put. Poor oral intake.continue with IV fluids.  Improved.   Muscle pain, weakness.  TSH  2.8, CK 172, Vitamin D 44. Will give vitamin D supplement. .  Suspect deconditioning.  Needs PT, rehab.  Tylenol for pain.  Not on statins.  May be some effect form hypokalemia. She is feeling better after K supplementation. Monitor K level.   HTN; continue with  Norvasc. Resume low dose lasix. Supplement potassium. Check B-met in 2-3 days.   Left knee swelling;  X-Ray negative for  fracture, Osteoarthritis.  Voltaren gel.   Hypokalemia; replaced with IV and  Oral supplement.  Hypomagnesemia; replaced IV prior to discharge. And will provide oral supplementation at discharge.   Discharge Diagnoses:  Principal Problem:   Acute encephalopathy Active Problems:   Hypothyroidism   Essential hypertension   Prediabetes   GERD  (gastroesophageal reflux disease)   Unwitnessed fall   Seizure [facility])    Discharge Instructions  Discharge Instructions    Diet - low sodium heart healthy   Complete by:  As directed    Increase activity slowly   Complete by:  As directed      Allergies as of 02/24/2018      Reactions   Sulfa Antibiotics Shortness Of Breath   Lorazepam Other (See Comments)   Restless, "fidgety"   Darvon [propoxyphene] Hives      Medication List    STOP taking these medications   meloxicam 7.5 MG tablet Commonly known as:  MOBIC   tizanidine 2 MG capsule Commonly known as:  ZANAFLEX     TAKE these medications   acetaminophen 325 MG tablet Commonly known as:  TYLENOL Take 2 tablets (650 mg total) by mouth 2 (two) times daily. What changed:    when to take this  reasons to take this   amLODipine 5 MG tablet Commonly known as:  NORVASC Take 1 tablet (5 mg total) by mouth daily. Start taking on:  02/25/2018   amoxicillin-clavulanate 875-125 MG tablet Commonly known as:  AUGMENTIN Take 1 tablet by mouth every 12 (twelve) hours for 1 day.   aspirin EC 81 MG tablet Take 81 mg by mouth daily.   diclofenac sodium 1 % Gel Commonly known as:  VOLTAREN Apply 2 g topically 4 (four) times daily.   esomeprazole 20 MG capsule Commonly known as:  NEXIUM Take 1 capsule (20 mg total) by mouth daily at 12 noon.   fluticasone 50 MCG/ACT nasal spray Commonly known as:  FLONASE Place 1 spray into both nostrils 2 (two) times daily as needed for allergies or rhinitis.   furosemide 20 MG tablet Commonly known as:  LASIX Take 0.5 tablets (10 mg total) by mouth daily.   levETIRAcetam 500 MG tablet Commonly known as:  KEPPRA Take 1 tablet (500 mg total) by mouth 2 (two) times daily. What changed:    how much to take  how to take this  when to take this  additional instructions   levothyroxine 75 MCG tablet Commonly known as:  SYNTHROID, LEVOTHROID Take 1 tablet (75 mcg total) by  mouth daily before breakfast.   Magnesium 200 MG Tabs Take 1 tablet (200 mg total) by mouth 2 (two) times daily.   MUSCLE RUB 10-15 % Crea Apply 1 application topically 2 (two) times daily as needed for muscle pain.   PHILLIPS STOOL SOFTENER 100 MG capsule Generic drug:  docusate sodium Take 100 mg by mouth daily as needed for mild constipation.   potassium chloride SA 20 MEQ tablet Commonly known as:  K-DUR,KLOR-CON Take 1 tablet (20 mEq total) by mouth daily.   saccharomyces boulardii 250 MG capsule Commonly known as:  FLORASTOR Take 1 capsule (250 mg total) by mouth 2 (two) times daily.   Vitamin D (Ergocalciferol) 50000 units Caps capsule Commonly known as:  DRISDOL Take 1 capsule (50,000 Units total) by mouth every 7 (seven) days. Start taking on:  03/01/2018   Vitamin D 2000 units Caps Take 1 capsule by mouth.       Allergies  Allergen Reactions  . Sulfa Antibiotics Shortness Of Breath  . Lorazepam Other (See Comments)    Restless, "fidgety"  . Darvon [Propoxyphene] Hives    Consultations:  Neurology    Procedures/Studies: Mr Brain Wo Contrast  Result Date: 02/22/2018 CLINICAL DATA:  Headache, acute. Normal neuro exam. Found on bedroom floor by son. EXAM: MRI HEAD WITHOUT CONTRAST TECHNIQUE: Multiplanar, multiecho pulse sequences of the brain and surrounding structures were obtained without intravenous contrast. COMPARISON:  Head CT from 3 days ago.  Brain MRI 09/19/2017 FINDINGS: Brain: No acute infarction, hemorrhage, hydrocephalus, extra-axial collection or mass lesion. Chronic small vessel ischemia with mild confluent periventricular FLAIR hyperintensity. Age normal brain volume. Vascular: Major flow voids are preserved Skull and upper cervical spine: No evidence of marrow lesion Sinuses/Orbits: Bilateral cataract resection.  No acute finding. IMPRESSION: Senescent changes without acute finding or change from 2018. Electronically Signed   By: Monte Fantasia  M.D.   On: 02/22/2018 13:40   Dg Chest Port 1 View  Result Date: 02/19/2018 CLINICAL DATA:  Altered mental status. EXAM: PORTABLE CHEST 1 VIEW COMPARISON:  Radiograph of September 18, 2017. FINDINGS: Stable cardiomegaly. Atherosclerosis of thoracic aorta is noted. No pneumothorax is noted. Right lung is clear. Left basilar atelectasis and pleural effusion is noted. Old distal right clavicular fracture is noted. IMPRESSION: Mild left basilar opacity is noted concerning for atelectasis with associated pleural effusion. Aortic Atherosclerosis (ICD10-I70.0). Electronically Signed   By: Marijo Conception, M.D.   On: 02/19/2018 10:38   Dg Hips Bilat With Pelvis 3-4 Views  Result Date: 02/21/2018 CLINICAL DATA:  Fall after seizure EXAM: DG HIP (WITH OR WITHOUT PELVIS) 3-4V BILAT COMPARISON:  09/23/2017 FINDINGS: Pubic symphysis and rami are intact. Multiple calcified phleboliths. SI joint degenerative changes. No acute displaced fracture or malalignment. IMPRESSION: No acute osseous abnormality. Electronically Signed   By: Donavan Foil M.D.   On: 02/21/2018 15:22   Ct Head Code Stroke Wo Contrast  Result Date: 02/19/2018 CLINICAL DATA:  Code stroke.  Altered level of consciousness. EXAM: CT HEAD WITHOUT CONTRAST TECHNIQUE: Contiguous axial images were obtained from the base of the skull through the vertex without intravenous contrast. COMPARISON:  09/18/2017 FINDINGS: Brain: No evidence of acute infarction, hemorrhage, hydrocephalus, extra-axial collection or mass lesion/mass effect. Mild atrophy and chronic small vessel ischemia for age. Vascular: Atherosclerotic calcification.  No hyperdense vessel. Skull: No acute or aggressive finding. Sinuses/Orbits: Bilateral cataract resection. No pathologic finding. Other: These results were communicated to Dr. Lorraine Lax at 9:27 [place] 3/27/2019by text page via the [facility] messaging system. ASPECTS [facility] Stroke Program Early CT Score) Not scored with this clinical history  IMPRESSION: No acute finding or change from 2018. Electronically Signed   By: Monte Fantasia M.D.   On: 02/19/2018 09:28   Dg Knee 3 Views Left  Result Date: 02/23/2018 CLINICAL DATA:  Left knee stiffness, swelling, pain EXAM: LEFT KNEE - 3 VIEW COMPARISON:  None. FINDINGS: Generalized osteopenia. No acute fracture or dislocation. Moderate left lateral femorotibial compartment joint space narrowing with marginal osteophytes. Mild left medial femorotibial compartment joint space narrowing. Mild patellofemoral compartment joint space narrowing. Small joint effusion. IMPRESSION: 1.  No acute osseous injury of the left knee. 2. Tricompartmental osteoarthritis of the left knee most severe in the lateral femorotibial compartment. Electronically Signed   By: Kathreen Devoid   On: 02/23/2018 14:07       Subjective: She report improvement of muscle pain and headaches.   Discharge Exam: Vitals:   02/23/18  2130 02/24/18 0534  BP: (!) 148/63 (!) 147/68  Pulse: 79 76  Resp: 18 (!) 21  Temp: 98 F (36.7 C) 98.1 F (36.7 C)  SpO2: 96% 99%   Vitals:   02/23/18 1320 02/23/18 1433 02/23/18 2130 02/24/18 0534  BP: (!) 142/62 (!) 144/71 (!) 148/63 (!) 147/68  Pulse:  86 79 76  Resp:  16 18 (!) 21  Temp:  (!) 97.5 F (36.4 C) 98 F (36.7 C) 98.1 F (36.7 C)  TempSrc:  Oral    SpO2: 98% 97% 96% 99%  Weight:        General: Pt is alert, awake, not in acute distress Cardiovascular: RRR, S1/S2 +, no rubs, no gallops Respiratory: CTA bilaterally, no wheezing, no rhonchi Abdominal: Soft, NT, ND, bowel sounds + Extremities: no edema, no cyanosis    The results of significant diagnostics from this hospitalization (including imaging, microbiology, ancillary and laboratory) are listed below for reference.     Microbiology: Recent Results (from the past 240 hour(s))  MRSA PCR Screening     Status: None   Collection Time: 02/19/18 10:01 PM  Result Value Ref Range Status   MRSA by PCR NEGATIVE  NEGATIVE Final    Comment:        The GeneXpert MRSA Assay (FDA approved for NASAL specimens only), is one component of a comprehensive MRSA colonization surveillance program. It is not intended to diagnose MRSA infection nor to guide or monitor treatment for MRSA infections. Performed at [facility], [place] [address], [place], [place] [address]   Urine culture     Status: Abnormal   Collection Time: 02/19/18 11:46 PM  Result Value Ref Range Status   Specimen Description URINE, CATHETERIZED  Final   Special Requests NONE  Final   Culture (A)  Final    <10,000 COLONIES/mL INSIGNIFICANT GROWTH Performed at [facility], [place] [address] [place], [place] [address]    Report Status 02/21/2018 FINAL  Final     Labs: BNP (last 3 results) No results for input(s): BNP in the last 8760 hours. Basic Metabolic Panel: Recent Labs  Lab 02/19/18 0847 02/19/18 0903 02/20/18 0438 02/23/18 1329 02/24/18 0706  NA 139 141 141 139 140  K 4.0 4.0 3.9 2.8* 3.9  CL 103 102 104 101 107  CO2 21*  --  26 27 26   GLUCOSE 199* 198* 96 92 96  BUN 14 19 18  5* 6  CREATININE 0.92 0.80 0.84 0.78 0.76  CALCIUM 9.6  --  9.4 9.0 9.1  MG  --   --   --   --  1.6*  PHOS  --   --   --   --  3.9   Liver Function Tests: Recent Labs  Lab 02/19/18 0847  AST 41  ALT 20  ALKPHOS 155*  BILITOT 1.0  PROT 7.2  ALBUMIN 4.1   No results for input(s): LIPASE, AMYLASE in the last 168 hours. No results for input(s): AMMONIA in the last 168 hours. CBC: Recent Labs  Lab 02/19/18 0847 02/19/18 0903 02/20/18 0438 02/23/18 1329  WBC 12.2*  --  11.9* 10.6*  NEUTROABS 10.1*  --   --   --   HGB 15.2* 16.0* 13.1 13.7  HCT 45.7 47.0* 41.8 42.6  MCV 93.1  --  95.7 92.4  PLT 271  --  242 274   Cardiac Enzymes: Recent Labs  Lab 02/19/18 0847 02/21/18 1433  CKTOTAL 61 172  CKMB 2.0  --  BNP: Invalid input(s): POCBNP CBG: Recent Labs  Lab 02/20/18 0852 02/21/18 0805 02/22/18 0816  02/23/18 0745 02/24/18 0750  GLUCAP 93 79 100* 101* 93   D-Dimer No results for input(s): DDIMER in the last 72 hours. Hgb A1c No results for input(s): HGBA1C in the last 72 hours. Lipid Profile No results for input(s): CHOL, HDL, LDLCALC, TRIG, CHOLHDL, LDLDIRECT in the last 72 hours. Thyroid function studies Recent Labs    02/21/18 1433  TSH 2.814   Anemia work up No results for input(s): VITAMINB12, FOLATE, FERRITIN, TIBC, IRON, RETICCTPCT in the last 72 hours. Urinalysis    Component Value Date/Time   COLORURINE STRAW (A) 02/19/2018 0847   APPEARANCEUR CLEAR 02/19/2018 0847   LABSPEC 1.011 02/19/2018 0847   PHURINE 7.0 02/19/2018 0847   GLUCOSEU 150 (A) 02/19/2018 0847   HGBUR MODERATE (A) 02/19/2018 0847   BILIRUBINUR NEGATIVE 02/19/2018 0847   KETONESUR 5 (A) 02/19/2018 0847   PROTEINUR 100 (A) 02/19/2018 0847   NITRITE NEGATIVE 02/19/2018 0847   LEUKOCYTESUR NEGATIVE 02/19/2018 0847   Sepsis Labs Invalid input(s): PROCALCITONIN,  WBC,  LACTICIDVEN Microbiology Recent Results (from the past 240 hour(s))  MRSA PCR Screening     Status: None   Collection Time: 02/19/18 10:01 PM  Result Value Ref Range Status   MRSA by PCR NEGATIVE NEGATIVE Final    Comment:        The GeneXpert MRSA Assay (FDA approved for NASAL specimens only), is one component of a comprehensive MRSA colonization surveillance program. It is not intended to diagnose MRSA infection nor to guide or monitor treatment for MRSA infections. Performed at [facility], [place] [address], [place], [place] [address]   Urine culture     Status: Abnormal   Collection Time: 02/19/18 11:46 PM  Result Value Ref Range Status   Specimen Description URINE, CATHETERIZED  Final   Special Requests NONE  Final   Culture (A)  Final    <10,000 COLONIES/mL INSIGNIFICANT GROWTH Performed at [facility], [place] [address], [place], [place] [address]    Report Status 02/21/2018 FINAL  Final     Time  coordinating discharge: Over 30 minutes  SIGNED:   Elmarie Shiley, MD  [facility] 02/24/2018, 10:11 AM Pager [phone]  If 7PM-7AM, please contact night-coverage [email or web address] Password [email or web address]

## 2018-02-24 NOTE — Clinical Social Work Placement (Signed)
   CLINICAL SOCIAL WORK PLACEMENT  NOTE  Date:  02/24/2018  Patient Details  Name: Samantha Woods MRN: [id number] Date of Birth: 1921-05-18  Clinical Social Work is seeking post-discharge placement for this patient at the [place] level of care (*CSW will initial, date and re-position this form in  chart as items are completed):  Yes   Patient/family provided with [place] Work Department's list of facilities offering this level of care within the geographic area requested by the patient (or if unable, by the patient's family).  Yes   Patient/family informed of their freedom to choose among providers that offer the needed level of care, that participate in Medicare, Medicaid or managed care program needed by the patient, have an available bed and are willing to accept the patient.  Yes   Patient/family informed of [facility] ownership interest in [facility] and [facility], as well as of the fact that they are under no obligation to receive care at these facilities.  PASRR submitted to EDS on       PASRR number received on       Existing PASRR number confirmed on 02/24/18     FL2 transmitted to all facilities in geographic area requested by pt/family on 02/24/18     FL2 transmitted to all facilities within larger geographic area on       Patient informed that his/her managed care company has contracts with or will negotiate with certain facilities, including the following:        Yes   Patient/family informed of bed offers received.  Patient chooses bed at [facility]     Physician recommends and patient chooses bed at      Patient to be transferred to [facility] on 02/24/18.  Patient to be transferred to facility by [facility]     Patient family notified on 02/24/18 of transfer.  Name of family member notified:  Son     PHYSICIAN       Additional Comment:    _______________________________________________ Benard Halsted, [place] 02/24/2018, 10:32 AM

## 2018-02-24 NOTE — Care Management Note (Signed)
Case Management Note  Patient Details  Name: Samantha Woods MRN: [id number] Date of Birth: 1921-09-06  Subjective/Objective:      Acute Encephalopathy ,Seizure             Action/Plan: Transition to SNF today. CSW managing disposition to facility.  Expected Discharge Date:  02/24/18               Expected Discharge Plan:  [place]  In-House Referral:  Clinical Social Work  Discharge planning Services  CM Consult   Status of Service:  Completed, signed off  If discussed at [employer] of Stay Meetings, dates discussed:    Additional Comments:  Sharin Mons, RN 02/24/2018, 10:51 AM

## 2018-02-24 NOTE — Progress Notes (Signed)
CSW initiated insurance authorization. [facility] able to accept patient when [place] received.   Percell Locus Kandance Yano LCSW [phone]

## 2018-02-24 NOTE — Care Management Important Message (Signed)
Important Message  Patient Details  Name: Samantha Woods MRN: [id number] Date of Birth: Apr 16, 1921   Medicare Important Message Given:  Yes    Orbie Pyo 02/24/2018, 3:08 PM

## 2018-02-25 DIAGNOSIS — M6281 Muscle weakness (generalized): Secondary | ICD-10-CM | POA: Diagnosis not present

## 2018-02-25 DIAGNOSIS — M159 Polyosteoarthritis, unspecified: Secondary | ICD-10-CM | POA: Diagnosis not present

## 2018-02-25 DIAGNOSIS — Z9181 History of falling: Secondary | ICD-10-CM | POA: Diagnosis not present

## 2018-02-25 DIAGNOSIS — G40909 Epilepsy, unspecified, not intractable, without status epilepticus: Secondary | ICD-10-CM | POA: Diagnosis not present

## 2018-03-04 ENCOUNTER — Encounter (INDEPENDENT_AMBULATORY_CARE_PROVIDER_SITE_OTHER): Payer: Self-pay

## 2018-03-04 ENCOUNTER — Telehealth: Payer: Self-pay | Admitting: Family Medicine

## 2018-03-06 DIAGNOSIS — Z9181 History of falling: Secondary | ICD-10-CM | POA: Diagnosis not present

## 2018-03-06 DIAGNOSIS — J189 Pneumonia, unspecified organism: Secondary | ICD-10-CM | POA: Diagnosis not present

## 2018-03-06 DIAGNOSIS — G40909 Epilepsy, unspecified, not intractable, without status epilepticus: Secondary | ICD-10-CM | POA: Diagnosis not present

## 2018-03-06 DIAGNOSIS — E669 Obesity, unspecified: Secondary | ICD-10-CM | POA: Diagnosis not present

## 2018-03-11 ENCOUNTER — Telehealth: Payer: Self-pay | Admitting: Family Medicine

## 2018-03-11 NOTE — Telephone Encounter (Signed)
Son in office to talk to Dr. Synthia Innocent nurse.

## 2018-03-12 ENCOUNTER — Encounter: Payer: Self-pay | Admitting: Family Medicine

## 2018-03-12 ENCOUNTER — Ambulatory Visit (INDEPENDENT_AMBULATORY_CARE_PROVIDER_SITE_OTHER): Payer: PPO | Admitting: Family Medicine

## 2018-03-12 VITALS — BP 115/62 | HR 71 | Temp 96.7°F | Ht 59.0 in | Wt 149.0 lb

## 2018-03-12 DIAGNOSIS — R296 Repeated falls: Secondary | ICD-10-CM | POA: Diagnosis not present

## 2018-03-12 DIAGNOSIS — G40909 Epilepsy, unspecified, not intractable, without status epilepticus: Secondary | ICD-10-CM

## 2018-03-12 DIAGNOSIS — R54 Age-related physical debility: Secondary | ICD-10-CM | POA: Diagnosis not present

## 2018-03-12 DIAGNOSIS — Z7409 Other reduced mobility: Secondary | ICD-10-CM

## 2018-03-12 NOTE — Progress Notes (Signed)
Subjective: CC: Impairment of mobility/ Face To Face Encounter for nursing home placement. PCP: Janora Norlander, DO NID:POEUM Ridings is a 84 y.o. female presenting to clinic today for:  1. Impairment of mobility Patient is brought to the office by her son, who is working to get her into a nursing facility.  She is currently undergoing physical rehabilitation at a short-term nursing facility after she had a second seizure and was found down at her home.  Her son notes that he does not feel confident that he can continue to provide the care that she needs.  He notes that she has had frequent falls and is physically impaired.  He is unable to provide 24/7 care.  Patient notes that she has to have assistance in order to walk.  She notes that her left lower extremity is her weak side.  She has had significant osteoarthritis within the left knee.  While she is able to self toilet, she requires assistance getting to and from the bathroom.  She needs assistance with grooming and bathing.  She does not cook for herself or prepare her own meals.  She notes some difficulty holding utensils.  There is questionable dysphasia, she reports frequent regurgitation of food despite food being chopped up quite well.  Her son goes on to note that she was actually in a [place] another state, where his brother lives.  He moved her to [place] to be closer to her and keep a better eye on her.  Her son is her power of attorney.  He comes today with an FL to form to be completed in efforts to get her into the new assisted living/nursing facility.   ROS: Per HPI  Allergies  Allergen Reactions  . Sulfa Antibiotics Shortness Of Breath  . Lorazepam Other (See Comments)    Restless, "fidgety"  . Darvon [Propoxyphene] Hives   Past Medical History:  Diagnosis Date  . GERD (gastroesophageal reflux disease)   . Hypertension   . Seizure ([place])   . Thyroid disease     Current Outpatient Medications:  .   acetaminophen (TYLENOL) 325 MG tablet, Take 2 tablets (650 mg total) by mouth 2 (two) times daily., Disp: 20 tablet, Rfl: 0 .  amLODipine (NORVASC) 5 MG tablet, Take 1 tablet (5 mg total) by mouth daily., Disp: 30 tablet, Rfl: 0 .  aspirin EC 81 MG tablet, Take 81 mg by mouth daily., Disp: , Rfl:  .  diclofenac sodium (VOLTAREN) 1 % GEL, Apply 2 g topically 4 (four) times daily., Disp: 100 g, Rfl: 0 .  docusate sodium (PHILLIPS STOOL SOFTENER) 100 MG capsule, Take 100 mg by mouth daily as needed for mild constipation., Disp: , Rfl:  .  esomeprazole (NEXIUM) 20 MG capsule, Take 1 capsule (20 mg total) by mouth daily at 12 noon., Disp: 90 capsule, Rfl: 0 .  furosemide (LASIX) 20 MG tablet, Take 0.5 tablets (10 mg total) by mouth daily., Disp: 45 tablet, Rfl: 0 .  levETIRAcetam (KEPPRA) 500 MG tablet, Take 1 tablet (500 mg total) by mouth 2 (two) times daily., Disp: 60 tablet, Rfl: 0 .  levothyroxine (SYNTHROID, LEVOTHROID) 75 MCG tablet, Take 1 tablet (75 mcg total) by mouth daily before breakfast., Disp: 90 tablet, Rfl: 3 .  Magnesium 200 MG TABS, Take 1 tablet (200 mg total) by mouth 2 (two) times daily., Disp: 6 each, Rfl: 0 .  potassium chloride SA (K-DUR,KLOR-CON) 20 MEQ tablet, Take 1 tablet (20 mEq total) by  mouth daily., Disp: 90 tablet, Rfl: 0 .  saccharomyces boulardii (FLORASTOR) 250 MG capsule, Take 1 capsule (250 mg total) by mouth 2 (two) times daily., Disp: 10 capsule, Rfl: 0 .  Vitamin D, Ergocalciferol, (DRISDOL) 50000 units CAPS capsule, Take 1 capsule (50,000 Units total) by mouth every 7 (seven) days., Disp: 1 capsule, Rfl: 0 .  fluticasone (FLONASE) 50 MCG/ACT nasal spray, Place 1 spray into both nostrils 2 (two) times daily as needed for allergies or rhinitis. (Patient not taking: Reported on 03/12/2018), Disp: 48 g, Rfl: 3 .  Menthol-Methyl Salicylate (MUSCLE RUB) 10-15 % CREA, Apply 1 application topically 2 (two) times daily as needed for muscle pain. (Patient not taking:  Reported on 03/12/2018), Disp: , Rfl: 0 Social History   Socioeconomic History  . Marital status: Widowed    Spouse name: Not on file  . Number of children: Not on file  . Years of education: Not on file  . Highest education level: Not on file  Occupational History  . Not on file  Social Needs  . Financial resource strain: Not on file  . Food insecurity:    Worry: Not on file    Inability: Not on file  . Transportation needs:    Medical: Not on file    Non-medical: Not on file  Tobacco Use  . Smoking status: Never Smoker  . Smokeless tobacco: Never Used  Substance and Sexual Activity  . Alcohol use: Yes    Alcohol/week: 0.0 oz    Comment: occasional  . Drug use: No  . Sexual activity: Not on file  Lifestyle  . Physical activity:    Days per week: Not on file    Minutes per session: Not on file  . Stress: Not on file  Relationships  . Social connections:    Talks on phone: Not on file    Gets together: Not on file    Attends religious service: Not on file    Active member of club or organization: Not on file    Attends meetings of clubs or organizations: Not on file    Relationship status: Not on file  . Intimate partner violence:    Fear of current or ex partner: Not on file    Emotionally abused: Not on file    Physically abused: Not on file    Forced sexual activity: Not on file  Other Topics Concern  . Not on file  Social History Narrative   Pt lives in a 1 story home with her son and his wife   Has 3 adult children   High School Graduate   Retired from [employer]    Family History  Problem Relation Age of Onset  . Heart disease Mother     Objective: Office vital signs reviewed. BP 115/62   Pulse 71   Temp (!) 96.7 F (35.9 C) (Oral)   Ht 4\' 11"  (1.499 m)   Wt 149 lb (67.6 kg)   BMI 30.09 kg/m   Physical Examination:  General: Awake, alert, nontoxic appearing, elderly, frail female, No acute distress HEENT: MMM, sclera  white Cardio: regular rate and rhythm, +1 DP Pulm: no wheeze; normal work of breathing on room air Extremities: warm,  No edema, cyanosis or clubbing; +1 pulses bilaterally MSK: arrives in wheelchair.  Gait not assessed. Patient able to move all extremities independently.  Lower extremities: She is able to elevate her left lower extremity but with some difficulty.  She easily moves her left lower  extremity.  She has quite a bit of arthritic change within the left knee with associated soft tissue swelling.  No increased warmth. Skin: dry; intact; no rashes or lesions Neuro: Light touch sensation grossly intact.  Patient follows all commands.  She is interactive with provider.  She is hard of hearing.  Assessment/ Plan: 84 y.o. female   1. Impaired mobility and ADLs From our conversation today, it appears that patient has significant impairment in her ADLs and IADLs.  Mobility is impaired, requiring 24/7 assistance.  Given her new onset seizure disorder and increasing dependence for activities of daily living, I agree that she would likely be best served by a care facility that can provide 24/7 assistance.  I have completed the FL2 form.  She would most certainly benefit from continued physical therapy.  She may warrant swallowing eval.  Though, her symptoms may also be related to underlying GERD.  2. Frequent falls Physical therapy as above  3. Seizure disorder ([place]) Continue to follow with neurology  4. Frailty As above.  Total time spent with patient 27 minutes.  Greater than 50% of encounter spent in coordination of care/counseling.   Janora Norlander, DO [place] [phone]

## 2018-03-17 ENCOUNTER — Other Ambulatory Visit: Payer: Self-pay | Admitting: *Deleted

## 2018-03-17 NOTE — Patient Outreach (Addendum)
[facility]) Care Management  03/17/2018  Samantha Woods Jul 05, 1921 [id number]   CSW received referral from [place], Christinia Gully 03/10/18. Patient is 84 year old presented to the ED with EMS unresponsive found on the floor by family. Patient has no movement on the left side and is deviated to the right and is aphasic. Left arm weakness, right arm drift, left & right leg weakness. Mute and severe dysarthria. PMH: HTN, hypothyroidism, and prior seizure on keppra. Patient lives with her son and will return home with son. Admitted 02/24/18 to [facility]. CSW called & spoke with patient's son, [name] who informed CSW that patient was discharged home from [facility] on 03/13/18 though he is working with Larene Beach, admissions coordinator at [facility] to get patient admitted there for long term care. Patient's son has also been working with Sheppard Evens with DSS to complete Medicaid application & hoping that patient will be admitted to [facility] within a week. CSW encouraged patient's son to call if any concerns or assistance is needed, but CSW will check back within a week to ensure move to [facility] has been secured.    Raynaldo Opitz, LCSW [facility]  Clinical Social Worker cell #: [phone]

## 2018-03-18 DIAGNOSIS — I10 Essential (primary) hypertension: Secondary | ICD-10-CM | POA: Diagnosis not present

## 2018-03-18 DIAGNOSIS — E039 Hypothyroidism, unspecified: Secondary | ICD-10-CM | POA: Diagnosis not present

## 2018-03-18 DIAGNOSIS — K219 Gastro-esophageal reflux disease without esophagitis: Secondary | ICD-10-CM | POA: Diagnosis not present

## 2018-03-18 DIAGNOSIS — R1311 Dysphagia, oral phase: Secondary | ICD-10-CM | POA: Diagnosis not present

## 2018-03-19 DIAGNOSIS — M6281 Muscle weakness (generalized): Secondary | ICD-10-CM | POA: Diagnosis not present

## 2018-03-19 DIAGNOSIS — R1311 Dysphagia, oral phase: Secondary | ICD-10-CM | POA: Diagnosis not present

## 2018-03-19 DIAGNOSIS — G40909 Epilepsy, unspecified, not intractable, without status epilepticus: Secondary | ICD-10-CM | POA: Diagnosis not present

## 2018-03-25 DIAGNOSIS — Z736 Limitation of activities due to disability: Secondary | ICD-10-CM | POA: Diagnosis not present

## 2018-03-25 DIAGNOSIS — Z79899 Other long term (current) drug therapy: Secondary | ICD-10-CM | POA: Diagnosis not present

## 2018-03-25 DIAGNOSIS — K219 Gastro-esophageal reflux disease without esophagitis: Secondary | ICD-10-CM | POA: Diagnosis not present

## 2018-03-25 DIAGNOSIS — D649 Anemia, unspecified: Secondary | ICD-10-CM | POA: Diagnosis not present

## 2018-03-25 DIAGNOSIS — E7800 Pure hypercholesterolemia, unspecified: Secondary | ICD-10-CM | POA: Diagnosis not present

## 2018-03-25 DIAGNOSIS — E559 Vitamin D deficiency, unspecified: Secondary | ICD-10-CM | POA: Diagnosis not present

## 2018-03-25 DIAGNOSIS — E119 Type 2 diabetes mellitus without complications: Secondary | ICD-10-CM | POA: Diagnosis not present

## 2018-03-25 DIAGNOSIS — E782 Mixed hyperlipidemia: Secondary | ICD-10-CM | POA: Diagnosis not present

## 2018-03-25 DIAGNOSIS — E039 Hypothyroidism, unspecified: Secondary | ICD-10-CM | POA: Diagnosis not present

## 2018-03-25 DIAGNOSIS — D518 Other vitamin B12 deficiency anemias: Secondary | ICD-10-CM | POA: Diagnosis not present

## 2018-03-26 DIAGNOSIS — G40909 Epilepsy, unspecified, not intractable, without status epilepticus: Secondary | ICD-10-CM | POA: Diagnosis not present

## 2018-03-26 DIAGNOSIS — M6281 Muscle weakness (generalized): Secondary | ICD-10-CM | POA: Diagnosis not present

## 2018-03-27 ENCOUNTER — Other Ambulatory Visit: Payer: Self-pay | Admitting: *Deleted

## 2018-03-27 NOTE — Patient Outreach (Signed)
[facility]) Care Management  03/27/2018  Samantha Woods 09/03/1921 [id number]   CSW called & confirmed with patient's son, Samantha Woods that patient was admitted to [facility] on 03/18/18 for long term care. CSW will close case as no further CSW needs identified.    Raynaldo Opitz, LCSW [facility]  Clinical Social Worker cell #: [phone]

## 2018-04-08 DIAGNOSIS — R600 Localized edema: Secondary | ICD-10-CM | POA: Diagnosis not present

## 2018-04-08 DIAGNOSIS — K219 Gastro-esophageal reflux disease without esophagitis: Secondary | ICD-10-CM | POA: Diagnosis not present

## 2018-04-08 DIAGNOSIS — E559 Vitamin D deficiency, unspecified: Secondary | ICD-10-CM | POA: Diagnosis not present

## 2018-04-08 DIAGNOSIS — E039 Hypothyroidism, unspecified: Secondary | ICD-10-CM | POA: Diagnosis not present

## 2018-04-14 DIAGNOSIS — Z79899 Other long term (current) drug therapy: Secondary | ICD-10-CM | POA: Diagnosis not present

## 2018-04-17 DIAGNOSIS — Z79899 Other long term (current) drug therapy: Secondary | ICD-10-CM | POA: Diagnosis not present

## 2018-08-13 ENCOUNTER — Ambulatory Visit: Payer: Self-pay | Admitting: Neurology

## 2018-09-12 IMAGING — DX DG HIP (WITH OR WITHOUT PELVIS) 3-4V BILAT
5 series · 5 of 5 positions shown · non-contrast
Comparison: 09/23/2017

CLINICAL DATA: Fall after seizure

EXAM:
DG HIP (WITH OR WITHOUT PELVIS) 3-4V BILAT

[pelvis ap]
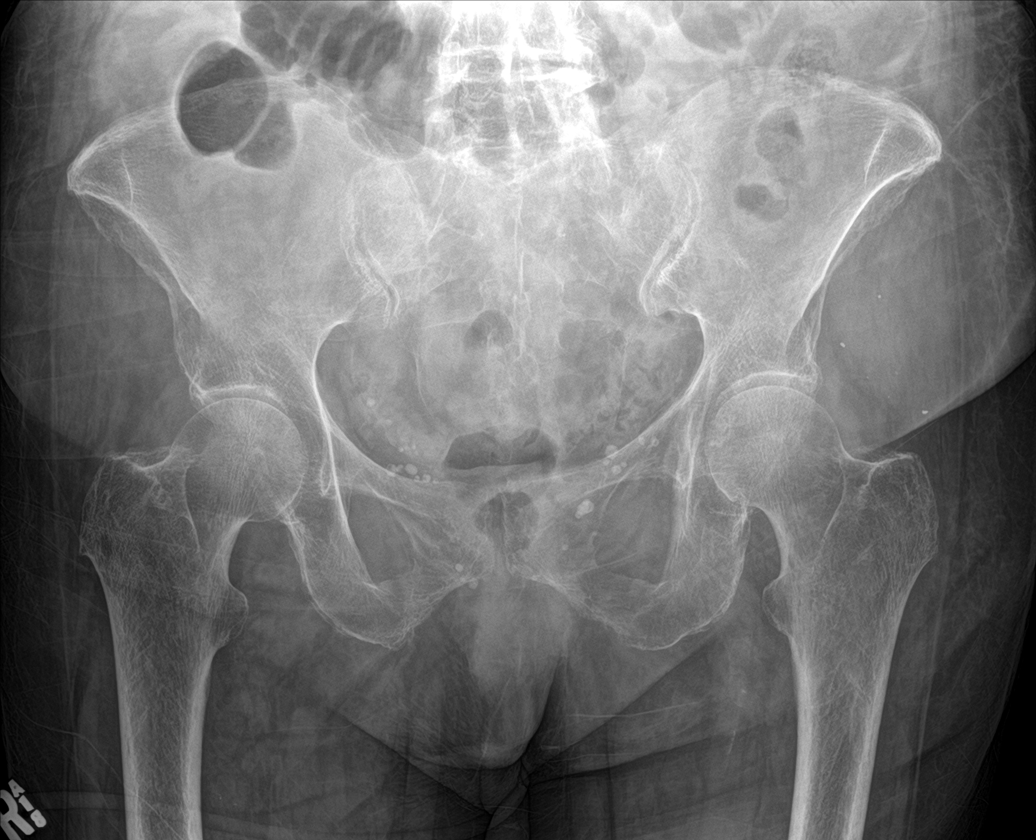

[hip ap (1 of 2)]
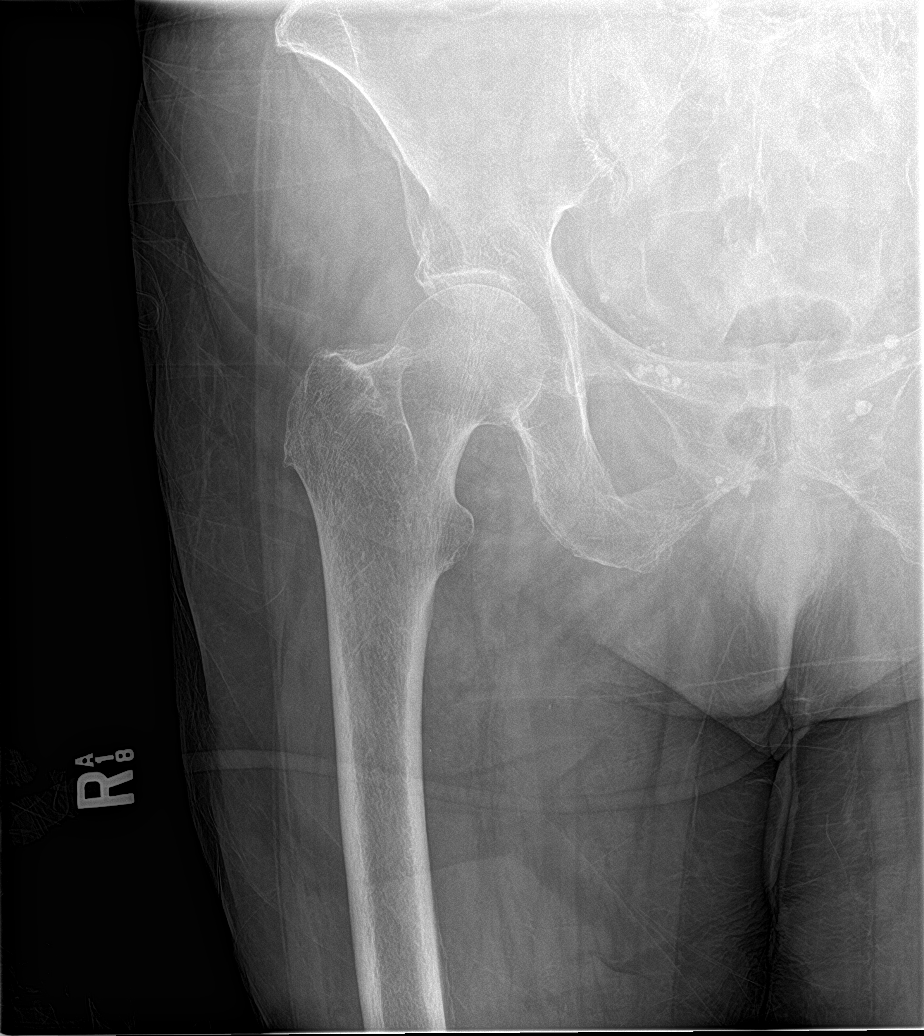

[hip lat (1 of 2)]
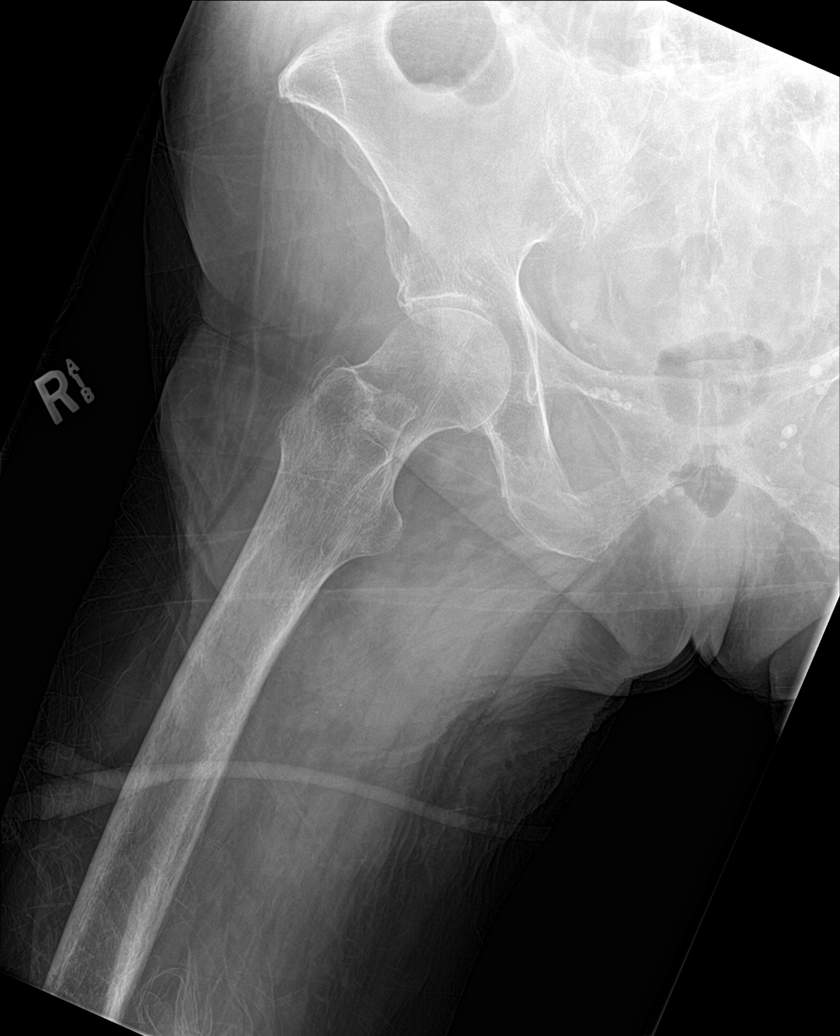

[hip ap (2 of 2)]
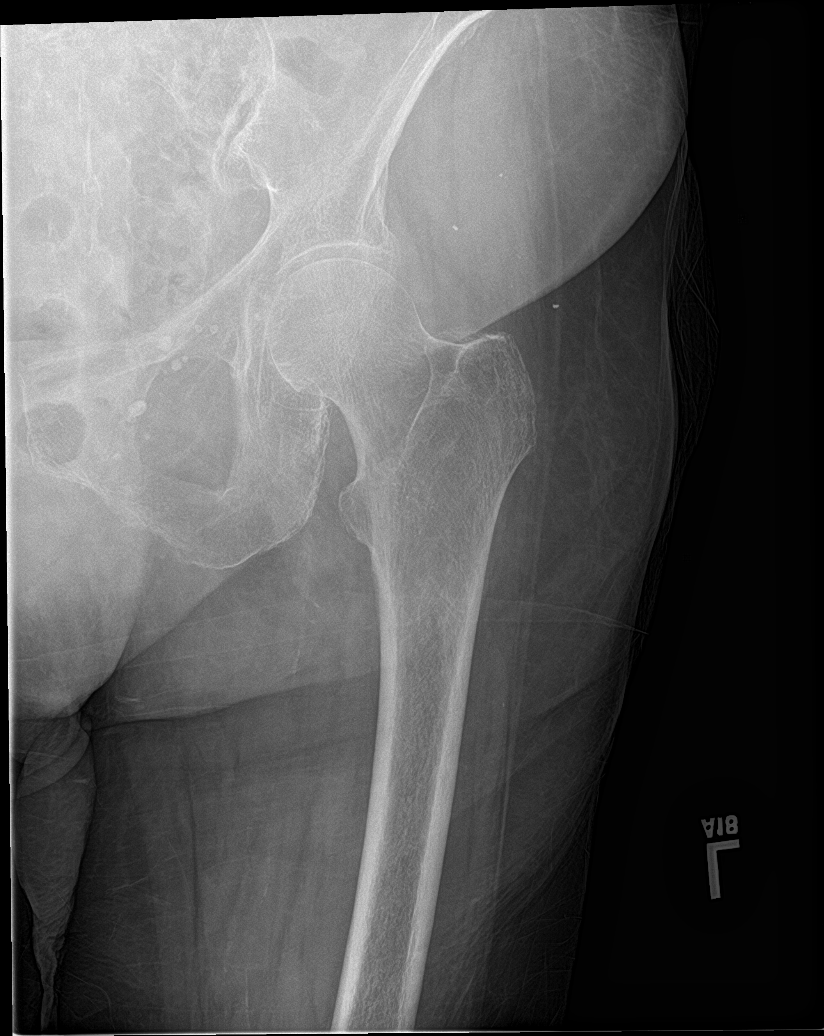

[hip lat (2 of 2)]
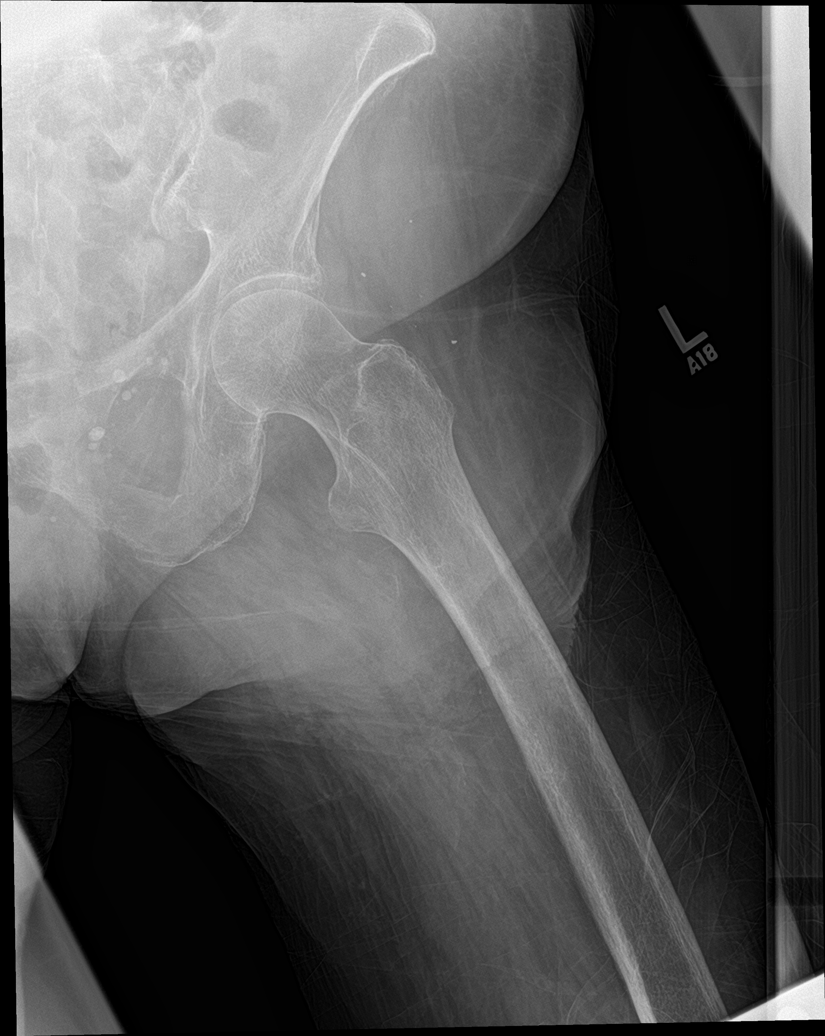

[5 of 5 positions shown; findings below may reference images not displayed]

FINDINGS: Pubic symphysis and rami are intact. Multiple calcified phleboliths.
SI joint degenerative changes. No acute displaced fracture or
malalignment.
IMPRESSION: No acute osseous abnormality.

## 2018-09-14 IMAGING — DX DG KNEE 3 VIEWS*L*
2 series · 2 of 2 positions shown · non-contrast
Comparison: None.

CLINICAL DATA: Left knee stiffness, swelling, pain

EXAM:
LEFT KNEE - 3 VIEW

[knee ap]
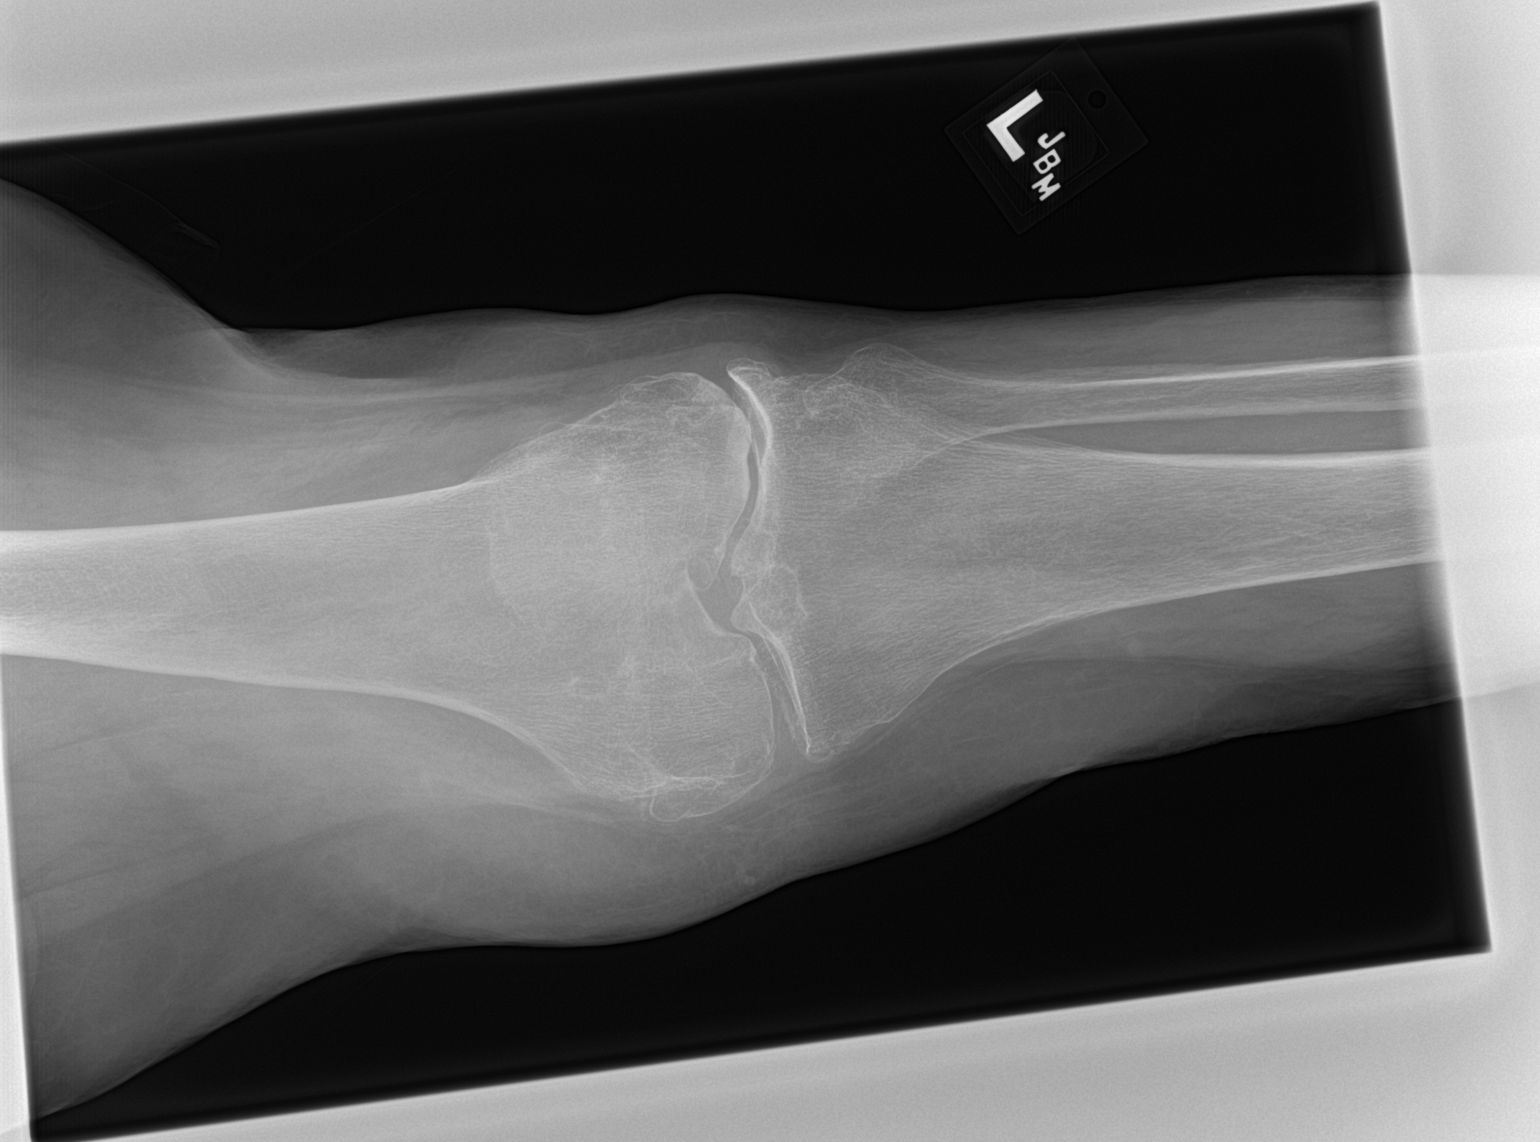

[knee lat]
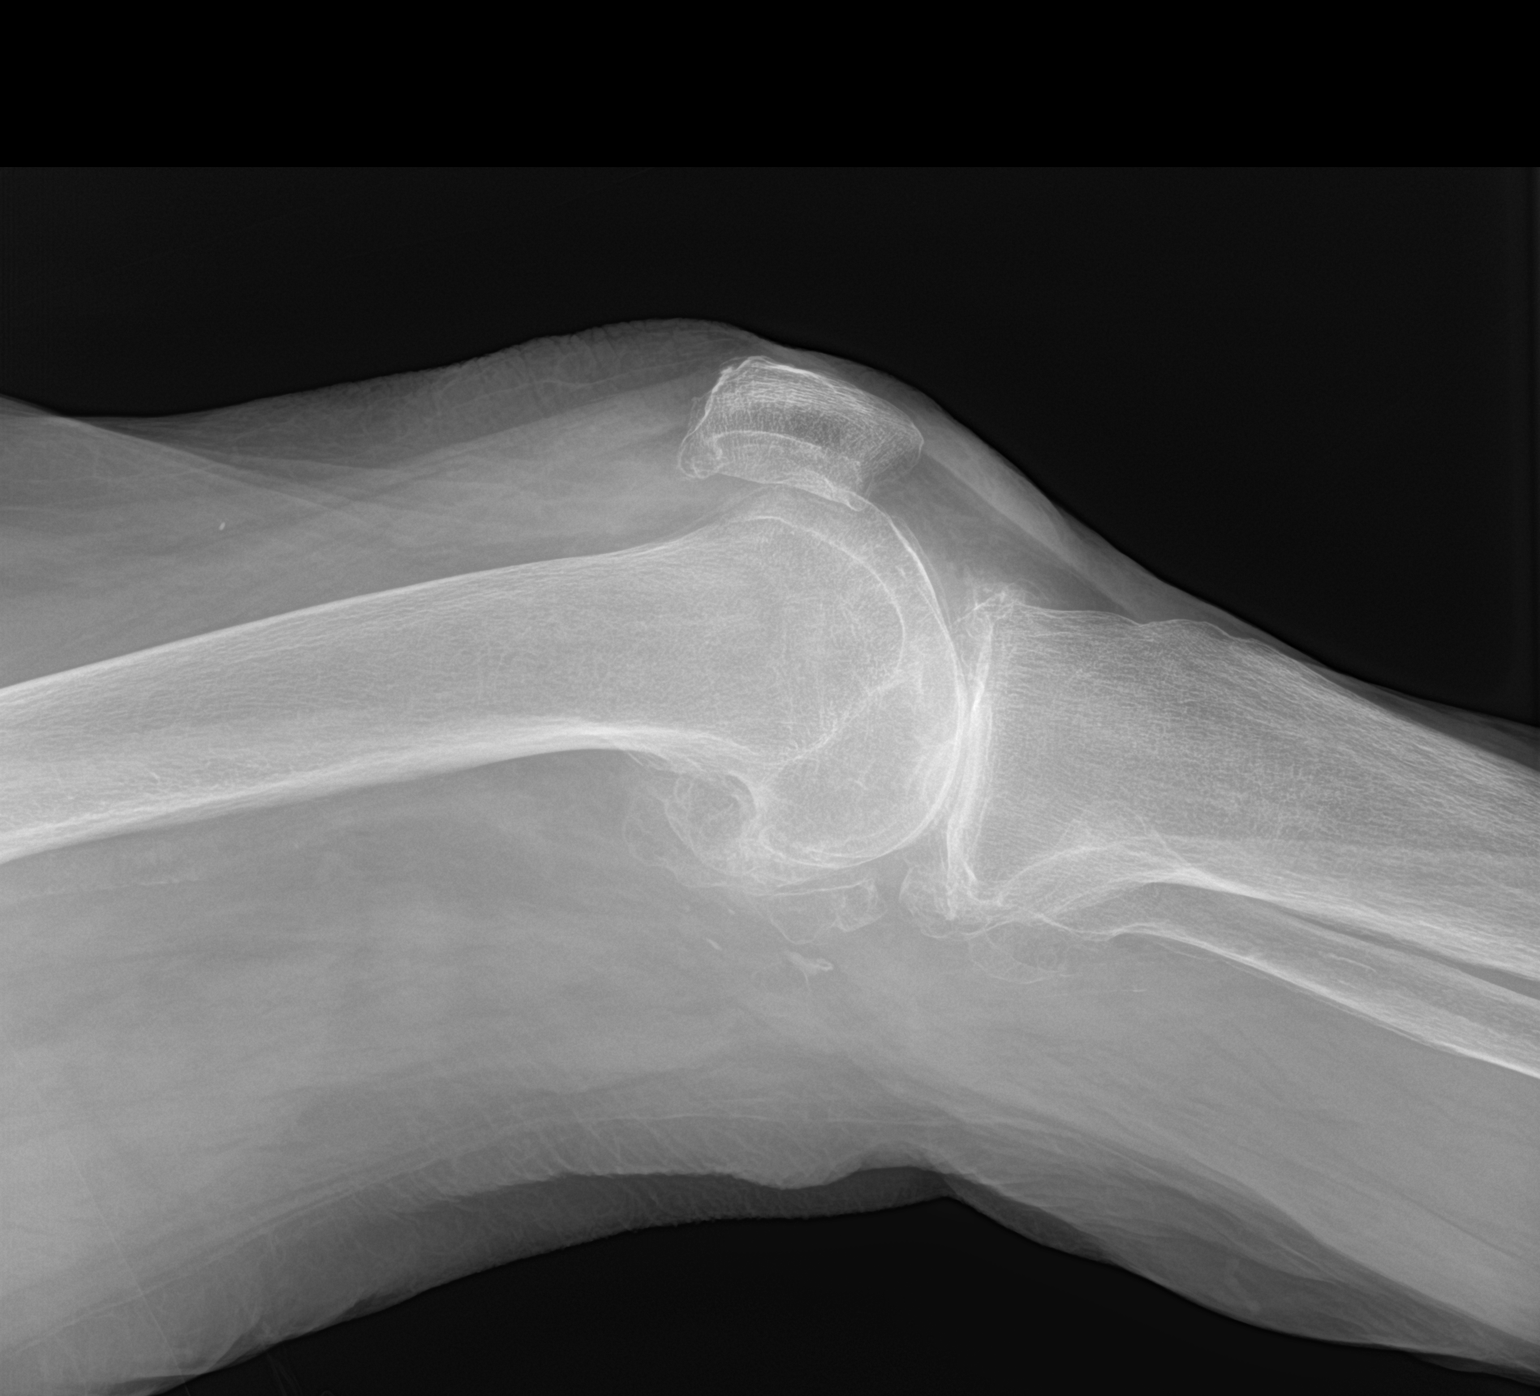

[2 of 2 positions shown; findings below may reference images not displayed]

FINDINGS: Generalized osteopenia. No acute fracture or dislocation. Moderate
left lateral femorotibial compartment joint space narrowing with
marginal osteophytes. Mild left medial femorotibial compartment
joint space narrowing. Mild patellofemoral compartment joint space
narrowing. Small joint effusion.
IMPRESSION: 1.  No acute osseous injury of the left knee.
2. Tricompartmental osteoarthritis of the left knee most severe in
the lateral femorotibial compartment.

## 2019-07-23 ENCOUNTER — Telehealth: Payer: PPO | Admitting: Physician Assistant

## 2019-07-23 ENCOUNTER — Other Ambulatory Visit: Payer: Self-pay

## 2019-07-23 DIAGNOSIS — Z20822 Contact with and (suspected) exposure to covid-19: Secondary | ICD-10-CM

## 2019-07-23 DIAGNOSIS — U071 COVID-19: Secondary | ICD-10-CM

## 2019-07-23 NOTE — Progress Notes (Signed)
E-Visit for [employer] Virus Screening   You do not need an order from a provider to be tested at a [facility] site. You just need to tell that you were directed from an Evisit provider to have the test completed.   [place] has multiple testing sites. For information on our COVID testing locations and hours go to [email or web address]  Please quarantine yourself while awaiting your test results.  We are enrolling you in our [place] for [place] . Daily you will receive a questionnaire within the [facility] website. Our COVID 19 response team willl be monitoriing your responses daily.   COVID-19 is a respiratory illness with symptoms that are similar to the flu. Symptoms are typically mild to moderate, but there have been cases of severe illness and death due to the virus. The following symptoms may appear 2-14 days after exposure: . Fever . Cough . Shortness of breath or difficulty breathing . Chills . Repeated shaking with chills . Muscle pain . Headache . Sore throat . New loss of taste or smell . Fatigue . Congestion or runny nose . Nausea or vomiting . Diarrhea  It is vitally important that if you feel that you have an infection such as this virus or any other virus that you stay home and away from places where you may spread it to others.  You should self-quarantine for 14 days if you have symptoms that could potentially be coronavirus or have been in close contact a with a person diagnosed with COVID-19 within the last 2 weeks. You should avoid contact with people age 63 and older.   You should wear a mask or cloth face covering over your nose and mouth if you must be around other people or animals, including pets (even at home). Try to stay at least 6 feet away from other people. This will protect the people around you.  Continue the over the counter medications for cough and stuffy nose.   Reduce your risk of any infection by using the same  precautions used for avoiding the common cold or flu:  [place] Kitchen Wash your hands often with soap and warm water for at least 20 seconds.  If soap and water are not readily available, use an alcohol-based hand sanitizer with at least 60% alcohol.  . If coughing or sneezing, cover your mouth and nose by coughing or sneezing into the elbow areas of your shirt or coat, into a tissue or into your sleeve (not your hands). . Avoid shaking hands with others and consider head nods or verbal greetings only. . Avoid touching your eyes, nose, or mouth with unwashed hands.  . Avoid close contact with people who are sick. . Avoid places or events with large numbers of people in one location, like concerts or sporting events. . Carefully consider travel plans you have or are making. . If you are planning any travel outside or inside the [place], visit the CDC's Travelers' Health webpage for the latest health notices. . If you have some symptoms but not all symptoms, continue to monitor at home and seek medical attention if your symptoms worsen. . If you are having a medical emergency, call 911.  HOME CARE . Only take medications as instructed by your medical team. . Drink plenty of fluids and get plenty of rest. . A steam or ultrasonic humidifier can help if you have congestion.   GET HELP RIGHT AWAY IF YOU HAVE EMERGENCY WARNING SIGNS** FOR COVID-19. If you or someone is  showing any of these signs seek emergency medical care immediately. Call 911 or proceed to your closest emergency facility if: . You develop worsening high fever. . Trouble breathing . Bluish lips or face . Persistent pain or pressure in the chest . New confusion . Inability to wake or stay awake . You cough up blood. . Your symptoms become more severe  **This list is not all possible symptoms. Contact your medical provider for any symptoms that are sever or concerning to you.   MAKE SURE YOU   Understand these instructions.  Will watch your  condition.  Will get help right away if you are not doing well or get worse.  Your e-visit answers were reviewed by a board certified advanced clinical practitioner to complete your personal care plan.  Depending on the condition, your plan could have included both over the counter or prescription medications.  If there is a problem please reply once you have received a response from your provider.  Your safety is important to [place].  If you have drug allergies check your prescription carefully.    You can use MyChart to ask questions about today's visit, request a non-urgent call back, or ask for a work or school excuse for 24 hours related to this e-Visit. If it has been greater than 24 hours you will need to follow up with your provider, or enter a new e-Visit to address those concerns. You will get an e-mail in the next two days asking about your experience.  I hope that your e-visit has been valuable and will speed your recovery. Thank you for using e-visits.   I have spent 5 minutes in review of e-visit questionnaire, review and updating patient chart, medical decision making and response to patient.    Tenna Delaine, PA-C

## 2019-07-25 LAB — NOVEL CORONAVIRUS, NAA: SARS-CoV-2, NAA: NOT DETECTED

## 2019-07-28 ENCOUNTER — Encounter: Payer: Self-pay | Admitting: Family Medicine

## 2019-07-28 ENCOUNTER — Other Ambulatory Visit: Payer: Self-pay | Admitting: Family Medicine

## 2019-07-28 DIAGNOSIS — Z20822 Contact with and (suspected) exposure to covid-19: Secondary | ICD-10-CM

## 2019-07-28 DIAGNOSIS — Z20828 Contact with and (suspected) exposure to other viral communicable diseases: Secondary | ICD-10-CM

## 2019-07-30 ENCOUNTER — Other Ambulatory Visit: Payer: Self-pay | Admitting: *Deleted

## 2019-07-30 DIAGNOSIS — R6889 Other general symptoms and signs: Secondary | ICD-10-CM | POA: Diagnosis not present

## 2019-07-30 DIAGNOSIS — Z20822 Contact with and (suspected) exposure to covid-19: Secondary | ICD-10-CM

## 2019-07-31 LAB — NOVEL CORONAVIRUS, NAA: SARS-CoV-2, NAA: NOT DETECTED

## 2019-08-25 ENCOUNTER — Other Ambulatory Visit: Payer: Self-pay

## 2019-08-26 ENCOUNTER — Encounter: Payer: Self-pay | Admitting: Family Medicine

## 2019-08-26 ENCOUNTER — Ambulatory Visit (INDEPENDENT_AMBULATORY_CARE_PROVIDER_SITE_OTHER): Payer: Medicare Other | Admitting: Family Medicine

## 2019-08-26 VITALS — BP 139/89 | HR 92 | Temp 98.4°F | Ht 59.0 in | Wt 138.0 lb

## 2019-08-26 DIAGNOSIS — J019 Acute sinusitis, unspecified: Secondary | ICD-10-CM

## 2019-08-26 DIAGNOSIS — L304 Erythema intertrigo: Secondary | ICD-10-CM

## 2019-08-26 DIAGNOSIS — Z7409 Other reduced mobility: Secondary | ICD-10-CM

## 2019-08-26 DIAGNOSIS — E038 Other specified hypothyroidism: Secondary | ICD-10-CM

## 2019-08-26 DIAGNOSIS — I10 Essential (primary) hypertension: Secondary | ICD-10-CM

## 2019-08-26 DIAGNOSIS — Z23 Encounter for immunization: Secondary | ICD-10-CM | POA: Diagnosis not present

## 2019-08-26 DIAGNOSIS — M7918 Myalgia, other site: Secondary | ICD-10-CM

## 2019-08-26 DIAGNOSIS — G40909 Epilepsy, unspecified, not intractable, without status epilepticus: Secondary | ICD-10-CM

## 2019-08-26 DIAGNOSIS — M47816 Spondylosis without myelopathy or radiculopathy, lumbar region: Secondary | ICD-10-CM

## 2019-08-26 DIAGNOSIS — D72829 Elevated white blood cell count, unspecified: Secondary | ICD-10-CM

## 2019-08-26 DIAGNOSIS — Z789 Other specified health status: Secondary | ICD-10-CM

## 2019-08-26 DIAGNOSIS — M1712 Unilateral primary osteoarthritis, left knee: Secondary | ICD-10-CM

## 2019-08-26 MED ORDER — LEVETIRACETAM 500 MG PO TABS: 500.0000 mg | ORAL_TABLET | Freq: Two times a day (BID) | ORAL | 3 refills | Status: DC

## 2019-08-26 MED ORDER — ACETAMINOPHEN 325 MG PO TABS: 650.0000 mg | ORAL_TABLET | Freq: Two times a day (BID) | ORAL | 3 refills | Status: AC

## 2019-08-26 MED ORDER — FLUTICASONE PROPIONATE 50 MCG/ACT NA SUSP: 1.0000 | Freq: Two times a day (BID) | NASAL | 3 refills | Status: DC | PRN

## 2019-08-26 MED ORDER — AMLODIPINE BESYLATE 5 MG PO TABS: 5.0000 mg | ORAL_TABLET | Freq: Every day | ORAL | 12 refills | Status: DC

## 2019-08-26 MED ORDER — LEVOTHYROXINE SODIUM 75 MCG PO TABS: 75.0000 ug | ORAL_TABLET | Freq: Every day | ORAL | 3 refills | Status: DC

## 2019-08-26 MED ORDER — DOCUSATE SODIUM 100 MG PO CAPS: 100.0000 mg | ORAL_CAPSULE | Freq: Two times a day (BID) | ORAL | 3 refills | Status: DC | PRN

## 2019-08-26 MED ORDER — SACCHAROMYCES BOULARDII 250 MG PO CAPS: 250.0000 mg | ORAL_CAPSULE | Freq: Two times a day (BID) | ORAL | 3 refills | Status: DC

## 2019-08-26 MED ORDER — OMEPRAZOLE 20 MG PO CPDR: 20.0000 mg | DELAYED_RELEASE_CAPSULE | Freq: Every day | ORAL | 3 refills | Status: DC

## 2019-08-26 MED ORDER — POTASSIUM CHLORIDE CRYS ER 20 MEQ PO TBCR: 20.0000 meq | EXTENDED_RELEASE_TABLET | Freq: Every day | ORAL | 3 refills | Status: DC

## 2019-08-26 MED ORDER — MAGNESIUM 200 MG PO TABS: 200.0000 mg | ORAL_TABLET | Freq: Two times a day (BID) | ORAL | 3 refills | Status: DC

## 2019-08-26 MED ORDER — CLOTRIMAZOLE 1 % EX CREA: 1.0000 "application " | TOPICAL_CREAM | Freq: Two times a day (BID) | CUTANEOUS | 1 refills | Status: DC

## 2019-08-26 MED ORDER — FUROSEMIDE 20 MG PO TABS: 10.0000 mg | ORAL_TABLET | Freq: Every day | ORAL | 12 refills | Status: DC

## 2019-08-26 MED ORDER — CELECOXIB 100 MG PO CAPS: 100.0000 mg | ORAL_CAPSULE | Freq: Two times a day (BID) | ORAL | 3 refills | Status: DC

## 2019-08-26 MED ORDER — LORATADINE 10 MG PO TABS: 10.0000 mg | ORAL_TABLET | Freq: Every day | ORAL | 3 refills | Status: DC

## 2019-08-26 MED ORDER — SYSTANE COMPLETE 0.6 % OP SOLN: OPHTHALMIC | 3 refills | Status: DC

## 2019-08-26 MED ORDER — DICLOFENAC SODIUM 1 % TD GEL: 2.0000 g | Freq: Four times a day (QID) | TRANSDERMAL | 3 refills | Status: DC

## 2019-08-26 NOTE — Progress Notes (Addendum)
Subjective: CC: Face-to-face for home health due to impaired mobility, joint pain, skin itching/rash PCP: Samantha Norlander, DO Samantha Woods is a 85 y.o. female presenting to clinic today for:  1.  Impaired mobility/joint pain Patient dependent on walker for mobility.  No falls.  She was recently discharged from [facility] and now is residing with her children.  She is accompanied today by her granddaughter to the visit.  She notes that she continues to have quite a bit of joint pain that is intermittent but seems to be worse at nighttime.  She is on 650 mg of Tylenol twice daily as well as 100 mg of Celebrex daily.  She uses topical Voltaren gel and topical icy hot patches to help relieve pain.  Pain is most notable in her left knee.  She is wondering if there is anything else she can do to help with this.  Of note she was treated with Norco possibly in her nursing home but this caused excessive sedation.  Once did avoid opioid medications.  2.  Rash Patient reports several day history of groin rash.  She describes it is itching and burning.  She has had similar under her breasts in the past.  She currently is keeping the area under her breast dry with powder.  She is wanting to know if we can send in a prescription cream to help with the rash.  She goes on to state that she is had quite an itchy scalp is wondering what she can do for that.  No use of head and shoulders or tea tree oil products.  3.  Seizure disorder Patient had a seizure after her Keppra was reduced.  She is subsequently gone back to 500 mg twice daily and has been seizure-free.  She is doing well on this.  Not currently established with neurology.   ROS: Per HPI  Allergies  Allergen Reactions  . Sulfa Antibiotics Shortness Of Breath  . Lorazepam Other (See Comments)    Restless, "fidgety"  . Darvon [Propoxyphene] Hives   Past Medical History:  Diagnosis Date  . GERD (gastroesophageal reflux disease)   .  Hypertension   . Seizure ([place])   . Thyroid disease     Current Outpatient Medications:  .  acetaminophen (TYLENOL) 325 MG tablet, Take 2 tablets (650 mg total) by mouth 2 (two) times daily., Disp: 20 tablet, Rfl: 0 .  amLODipine (NORVASC) 5 MG tablet, Take 1 tablet (5 mg total) by mouth daily., Disp: 30 tablet, Rfl: 0 .  celecoxib (CELEBREX) 100 MG capsule, Take 100 mg by mouth daily., Disp: , Rfl:  .  diclofenac sodium (VOLTAREN) 1 % GEL, Apply 2 g topically 4 (four) times daily., Disp: 100 g, Rfl: 0 .  docusate sodium (PHILLIPS STOOL SOFTENER) 100 MG capsule, Take 100 mg by mouth daily as needed for mild constipation., Disp: , Rfl:  .  fluticasone (FLONASE) 50 MCG/ACT nasal spray, Place 1 spray into both nostrils 2 (two) times daily as needed for allergies or rhinitis., Disp: 48 g, Rfl: 3 .  furosemide (LASIX) 20 MG tablet, Take 0.5 tablets (10 mg total) by mouth daily., Disp: 45 tablet, Rfl: 0 .  levETIRAcetam (KEPPRA) 500 MG tablet, Take 1 tablet (500 mg total) by mouth 2 (two) times daily., Disp: 60 tablet, Rfl: 0 .  levothyroxine (SYNTHROID, LEVOTHROID) 75 MCG tablet, Take 1 tablet (75 mcg total) by mouth daily before breakfast., Disp: 90 tablet, Rfl: 3 .  loratadine (CLARITIN) 10 MG  tablet, Take 10 mg by mouth daily., Disp: , Rfl:  .  Magnesium 200 MG TABS, Take 1 tablet (200 mg total) by mouth 2 (two) times daily., Disp: 6 each, Rfl: 0 .  Menthol-Methyl Salicylate (MUSCLE RUB) 10-15 % CREA, Apply 1 application topically 2 (two) times daily as needed for muscle pain., Disp: , Rfl: 0 .  omeprazole (PRILOSEC) 20 MG capsule, Take 20 mg by mouth daily., Disp: , Rfl:  .  potassium chloride SA (K-DUR,KLOR-CON) 20 MEQ tablet, Take 1 tablet (20 mEq total) by mouth daily., Disp: 90 tablet, Rfl: 0 .  saccharomyces boulardii (FLORASTOR) 250 MG capsule, Take 1 capsule (250 mg total) by mouth 2 (two) times daily., Disp: 10 capsule, Rfl: 0 Social History   Socioeconomic History  . Marital status:  Widowed    Spouse name: Not on file  . Number of children: Not on file  . Years of education: Not on file  . Highest education level: Not on file  Occupational History  . Not on file  Social Needs  . Financial resource strain: Not on file  . Food insecurity    Worry: Not on file    Inability: Not on file  . Transportation needs    Medical: Not on file    Non-medical: Not on file  Tobacco Use  . Smoking status: Never Smoker  . Smokeless tobacco: Never Used  Substance and Sexual Activity  . Alcohol use: Yes    Alcohol/week: 0.0 standard drinks    Comment: occasional  . Drug use: No  . Sexual activity: Not on file  Lifestyle  . Physical activity    Days per week: Not on file    Minutes per session: Not on file  . Stress: Not on file  Relationships  . Social Herbalist on phone: Not on file    Gets together: Not on file    Attends religious service: Not on file    Active member of club or organization: Not on file    Attends meetings of clubs or organizations: Not on file    Relationship status: Not on file  . Intimate partner violence    Fear of current or ex partner: Not on file    Emotionally abused: Not on file    Physically abused: Not on file    Forced sexual activity: Not on file  Other Topics Concern  . Not on file  Social History Narrative   Pt lives in a 1 story home with her son and his wife   Has 3 adult children   High School Graduate   Retired from [employer]    Family History  Problem Relation Age of Onset  . Heart disease Mother     Objective: Office vital signs reviewed. BP 139/89   Pulse 92   Temp 98.4 F (36.9 C) (Oral)   Ht 4' 11"  (1.499 m)   Wt 138 lb (62.6 kg)   SpO2 95%   BMI 27.87 kg/m   Physical Examination:  General: Awake, alert, well nourished, No acute distress HEENT: Normal, sclera white, MMM Cardio: regular rate and rhythm, S1S2 heard, no murmurs appreciated Pulm: clear to auscultation bilaterally,  no wheezes, rhonchi or rales; normal work of breathing on room air Extremities: warm, well perfused, No edema, cyanosis or clubbing; +2 pulses bilaterally MSK: uses rolling walker for ambulation Skin: dry; she has a few seborrheic keratosis noted along the posterior upper extremity; groin was not evaluated due to  debility Neuro: Very interactive with provider.  Answers questions appropriately.  Assessment/ Plan: 85 y.o. female   1. Impaired mobility and ADLs I placed a home health referral for physical therapy.  I think strengthening exercises and balance training would be very beneficial for this patient.  Additionally, facilitating safety with her walker in the home that she is currently residing in would be helpful. - Ambulatory referral to Home Health  2. Essential hypertension Controlled.  Continue current regimen - CMP14+EGFR - Magnesium  3. Other specified hypothyroidism Asymptomatic check thyroid panel - Thyroid Panel With TSH  4. Leukocytosis, unspecified type - CBC  5. Acute rhinosinusitis This was not reviewed during today's visit but patient needed refills - fluticasone (FLONASE) 50 MCG/ACT nasal spray; Place 1 spray into both nostrils 2 (two) times daily as needed for allergies or rhinitis.  Dispense: 48 g; Refill: 3  6. Osteoarthritis of lumbar spine, unspecified spinal osteoarthritis complication status - Ambulatory referral to [place]  7. Tricompartment osteoarthritis of left knee - Ambulatory referral to Home Health  8. Musculoskeletal pain I have recommended increasing Celebrex to 100 mg twice daily.  I reviewed her last renal function panel and there does not seem to be any contraindications to this increase.  Continue PPI for stomach protection and monitor for any evidence of GI bleed.  We discussed avoidance of other NSAIDs given use of Celebrex.  Okay to continue Tylenol. - Ambulatory referral to [place]  9. Need for immunization against influenza  Administered during today's visit - Flu Vaccine QUAD High Dose(Fluad)  10. Seizure disorder ([place]) Controlled with Keppra.  Continue current regimen  11. Intertrigo Clotrimazole twice daily applied to the affected areas for presumed intertrigo.  Keep the lesions as dry as possible.  Monitor for signs and symptoms of secondary bacterial infection  No orders of the defined types were placed in this encounter.  Meds ordered this encounter  Medications  . acetaminophen (TYLENOL) 325 MG tablet    Sig: Take 2 tablets (650 mg total) by mouth 2 (two) times daily.    Dispense:  120 tablet    Refill:  3  . amLODipine (NORVASC) 5 MG tablet    Sig: Take 1 tablet (5 mg total) by mouth daily.    Dispense:  30 tablet    Refill:  12  . diclofenac sodium (VOLTAREN) 1 % GEL    Sig: Apply 2 g topically 4 (four) times daily.    Dispense:  200 g    Refill:  3  . fluticasone (FLONASE) 50 MCG/ACT nasal spray    Sig: Place 1 spray into both nostrils 2 (two) times daily as needed for allergies or rhinitis.    Dispense:  48 g    Refill:  3  . furosemide (LASIX) 20 MG tablet    Sig: Take 0.5 tablets (10 mg total) by mouth daily.    Dispense:  15 tablet    Refill:  12  . levETIRAcetam (KEPPRA) 500 MG tablet    Sig: Take 1 tablet (500 mg total) by mouth 2 (two) times daily.    Dispense:  60 tablet    Refill:  3  . DISCONTD: Magnesium 200 MG TABS    Sig: Take 1 tablet (200 mg total) by mouth 2 (two) times daily.    Dispense:  60 tablet    Refill:  3  . potassium chloride SA (KLOR-CON) 20 MEQ tablet    Sig: Take 1 tablet (20 mEq total) by mouth  daily.    Dispense:  30 tablet    Refill:  3  . levothyroxine (SYNTHROID) 75 MCG tablet    Sig: Take 1 tablet (75 mcg total) by mouth daily before breakfast.    Dispense:  30 tablet    Refill:  3  . saccharomyces boulardii (FLORASTOR) 250 MG capsule    Sig: Take 1 capsule (250 mg total) by mouth 2 (two) times daily.    Dispense:  60 capsule    Refill:  3   . celecoxib (CELEBREX) 100 MG capsule    Sig: Take 1 capsule (100 mg total) by mouth 2 (two) times daily.    Dispense:  60 capsule    Refill:  3  . docusate sodium (PHILLIPS STOOL SOFTENER) 100 MG capsule    Sig: Take 1 capsule (100 mg total) by mouth 2 (two) times daily as needed for mild constipation.    Dispense:  60 capsule    Refill:  3  . omeprazole (PRILOSEC) 20 MG capsule    Sig: Take 1 capsule (20 mg total) by mouth daily.    Dispense:  30 capsule    Refill:  3  . loratadine (CLARITIN) 10 MG tablet    Sig: Take 1 tablet (10 mg total) by mouth daily.    Dispense:  30 tablet    Refill:  3  . clotrimazole (LOTRIMIN) 1 % cream    Sig: Apply 1 application topically 2 (two) times daily. x2 weeks to groin for rash    Dispense:  30 g    Refill:  1  . Propylene Glycol (SYSTANE COMPLETE) 0.6 % SOLN    Sig: Instill 1 drop in each eye  BID    Dispense:  15 mL    Refill:  3  . Magnesium 200 MG TABS    Sig: Take 1 tablet (200 mg total) by mouth 2 (two) times daily.    Dispense:  60 tablet    Refill:  [place], [place] [phone]

## 2019-08-26 NOTE — Patient Instructions (Addendum)
You had labs performed today.  You will be contacted with the results of the labs once they are available, usually in the next 3 business days for routine lab work.  If you have an active my chart account, they will be released to your MyChart.  If you prefer to have these labs released to you via telephone, please let us know.  If you had a pap smear or biopsy performed, expect to be contacted in about 7-10 days.  Use tea tree oil/ head and shoulders for scalp itching.

## 2019-09-06 LAB — CMP14+EGFR
ALT: 13 IU/L (ref 0–32)
AST: 20 IU/L (ref 0–40)
Albumin/Globulin Ratio: 1.4 (ref 1.2–2.2)
Albumin: 3.8 g/dL (ref 3.5–4.6)
Alkaline Phosphatase: 125 IU/L — ABNORMAL HIGH (ref 39–117)
BUN/Creatinine Ratio: 15 (ref 12–28)
BUN: 12 mg/dL (ref 10–36)
Bilirubin Total: 0.8 mg/dL (ref 0.0–1.2)
CO2: 26 mmol/L (ref 20–29)
Calcium: 9.3 mg/dL (ref 8.7–10.3)
Chloride: 101 mmol/L (ref 96–106)
Creatinine, Ser: 0.81 mg/dL (ref 0.57–1.00)
GFR calc Af Amer: 71 mL/min/{1.73_m2} (ref >59)
GFR calc non Af Amer: 61 mL/min/{1.73_m2} (ref >59)
Globulin, Total: 2.8 g/dL (ref 1.5–4.5)
Glucose: 99 mg/dL (ref 65–99)
Potassium: 4.3 mmol/L (ref 3.5–5.2)
Sodium: 141 mmol/L (ref 134–144)
Total Protein: 6.6 g/dL (ref 6.0–8.5)

## 2019-09-06 LAB — THYROID PANEL WITH TSH
Free Thyroxine Index: 3.4 (ref 1.2–4.9)
T3 Uptake Ratio: 34 % (ref 24–39)
T4, Total: 9.9 ug/dL (ref 4.5–12.0)
TSH: 0.773 u[IU]/mL (ref 0.450–4.500)

## 2019-09-06 LAB — CBC
Hematocrit: 43.5 % (ref 34.0–46.6)
Hemoglobin: 13.4 g/dL (ref 11.1–15.9)
MCH: 30.2 pg (ref 26.6–33.0)
MCHC: 30.8 g/dL — ABNORMAL LOW (ref 31.5–35.7)
MCV: 98 fL — ABNORMAL HIGH (ref 79–97)
Platelets: 277 10*3/uL (ref 150–450)
RBC: 4.44 x10E6/uL (ref 3.77–5.28)
RDW: 13.2 % (ref 11.7–15.4)
WBC: 8.7 10*3/uL (ref 3.4–10.8)

## 2019-09-06 LAB — MAGNESIUM: Magnesium: 2 mg/dL (ref 1.6–2.3)

## 2019-09-07 ENCOUNTER — Encounter: Payer: Self-pay | Admitting: Family Medicine

## 2019-09-08 MED ORDER — THERA VITAL M PO TABS: 1.0000 | ORAL_TABLET | Freq: Every day | ORAL | 5 refills | Status: DC

## 2019-09-08 NOTE — Addendum Note (Signed)
Addended byCarrolyn Leigh on: 09/08/2019 04:38 PM   Modules accepted: Orders

## 2019-09-10 ENCOUNTER — Other Ambulatory Visit: Payer: Self-pay | Admitting: Family Medicine

## 2019-11-05 ENCOUNTER — Other Ambulatory Visit: Payer: Self-pay | Admitting: Family Medicine

## 2019-12-09 ENCOUNTER — Other Ambulatory Visit: Payer: Self-pay | Admitting: Family Medicine

## 2019-12-09 ENCOUNTER — Ambulatory Visit (INDEPENDENT_AMBULATORY_CARE_PROVIDER_SITE_OTHER): Payer: Medicare Other | Admitting: Family Medicine

## 2019-12-09 ENCOUNTER — Encounter: Payer: Self-pay | Admitting: Family Medicine

## 2019-12-09 DIAGNOSIS — Z7409 Other reduced mobility: Secondary | ICD-10-CM | POA: Diagnosis not present

## 2019-12-09 DIAGNOSIS — Z789 Other specified health status: Secondary | ICD-10-CM

## 2019-12-09 DIAGNOSIS — Z7189 Other specified counseling: Secondary | ICD-10-CM | POA: Diagnosis not present

## 2019-12-09 NOTE — Progress Notes (Signed)
Telephone visit  Subjective: CC: 3 month follow up PCP: Janora Norlander, DO [id number] Samantha Woods is a 86 y.o. female calls for telephone consult today. Patient provides verbal consent for consult held via phone.  Due to COVID-19 pandemic this visit was conducted virtually. This visit type was conducted due to national recommendations for restrictions regarding the COVID-19 Pandemic (e.g. social distancing, sheltering in place) in an effort to limit this patient's exposure and mitigate transmission in our community. All issues noted in this document were discussed and addressed.  A physical exam was not performed with this format.   Location of patient: home Location of provider: Working remotely from home Others present for call: daughter, Ms Ward  1.  Immobility and physical deconditioning Patient unfortunately never did get home health to come out and do physical therapy with her.  She is essentially dependent upon her children for care.  She reports me that she feels well and has been doing well albeit she is weak and has some stiffness in the morning when she wakes up.  Her son and daughter in law have been caring for her but there are plans to transition to living with her daughter.  Her daughter is getting a town home locally soon.  She does think that she will benefit from a hospital bed with something to help her pull up in bed as she has quite a bit of difficulty doing turns on her own and getting up from a lying position on her own.  2.  Hypothyroidism Patient is compliant with her thyroid medication.  She is not had any issues with unplanned weight loss or weight gain, change in bowel habits or heart palpitations.  ROS: Per HPI  Allergies  Allergen Reactions  . Sulfa Antibiotics Shortness Of Breath  . Lorazepam Other (See Comments)    Restless, "fidgety"  . Darvon [Propoxyphene] Hives   Past Medical History:  Diagnosis Date  . GERD (gastroesophageal reflux disease)   .  Hypertension   . Seizure ([place])   . Thyroid disease     Current Outpatient Medications:  .  ALLERGY RELIEF 10 MG tablet, TAKE (1) TABLET BY MOUTH ONCE DAILY., Disp: 28 tablet, Rfl: 11 .  amLODipine (NORVASC) 5 MG tablet, Take 1 tablet (5 mg total) by mouth daily., Disp: 30 tablet, Rfl: 12 .  celecoxib (CELEBREX) 100 MG capsule, TAKE 1 CAPSULE BY MOUTH TWICE DAILY., Disp: 56 capsule, Rfl: 11 .  clotrimazole (LOTRIMIN) 1 % cream, APPLY 1 APPLICATION TO THE AFFECTED AREA ON THE GROIN FOR RASH 2 TIMES DAILY FOR 2 WEEKS, Disp: 30 g, Rfl: 11 .  diclofenac sodium (VOLTAREN) 1 % GEL, Apply 2 g topically 4 (four) times daily., Disp: 200 g, Rfl: 3 .  docusate sodium (PHILLIPS STOOL SOFTENER) 100 MG capsule, Take 1 capsule (100 mg total) by mouth 2 (two) times daily as needed for mild constipation., Disp: 60 capsule, Rfl: 3 .  FLORASTOR 250 MG capsule, TAKE 1 CAPSULE BY MOUTH TWICE DAILY., Disp: 56 capsule, Rfl: 11 .  fluticasone (FLONASE) 50 MCG/ACT nasal spray, Place 1 spray into both nostrils 2 (two) times daily as needed for allergies or rhinitis., Disp: 48 g, Rfl: 3 .  furosemide (LASIX) 20 MG tablet, Take 0.5 tablets (10 mg total) by mouth daily., Disp: 15 tablet, Rfl: 12 .  levETIRAcetam (KEPPRA) 500 MG tablet, TAKE 1 TABLET BY MOUTH TWICE DAILY., Disp: 56 tablet, Rfl: 11 .  levothyroxine (SYNTHROID) 75 MCG tablet, TAKE (1) TABLET BY MOUTH  ONCE DAILY BEFORE BREAKFAST., Disp: 28 tablet, Rfl: 11 .  MAGNESIUM-OXIDE 400 (241.3 Mg) MG tablet, TAKE (1/2) TABLET (=200MG ) BY MOUTH TWICE DAILY., Disp: 28 tablet, Rfl: 11 .  Menthol-Methyl Salicylate (MUSCLE RUB) 10-15 % CREA, Apply 1 application topically 2 (two) times daily as needed for muscle pain., Disp: , Rfl: 0 .  Multiple Vitamins-Minerals (MULTIVITAMIN) tablet, Take 1 tablet by mouth daily., Disp: 30 tablet, Rfl: 5 .  NON-ASPIRIN PAIN RELIEF 325 MG tablet, TAKE (2) TABLETS BY MOUTH TWICE DAILY., Disp: 112 tablet, Rfl: 11 .  omeprazole (PRILOSEC) 20 MG  capsule, TAKE 1 CAPSULE BY MOUTH ONCE A DAY., Disp: 28 capsule, Rfl: 11 .  potassium chloride SA (KLOR-CON) 20 MEQ tablet, TAKE (1) TABLET BY MOUTH ONCE DAILY., Disp: 28 tablet, Rfl: 11 .  Propylene Glycol (SYSTANE COMPLETE) 0.6 % SOLN, Instill 1 drop in each eye  BID, Disp: 15 mL, Rfl: 3  Assessment/ Plan: 86 y.o. female   1. Impaired mobility and ADLs Overall she is doing very well.  Plans for transition over to residing with her daughter, who will be providing 24-hour care to the patient.  We will likely need to arrange for a medical bed with an attachment to help her sit up and raise in bed.  2. Advice given about COVID-19 virus by telephone Patient is concerned about getting the Covid vaccine and wants to hold off on this until others have gotten at least their second dose.  I had a long conversation about mRNA vaccines today, current guidelines and data thus far.  If she is isolating, I think that it is okay to wait on the vaccine but I certainly encourage her to get the vaccine at some point.  I do not see any contraindications to her getting this potentially life-saving vaccine.   Start time: 1:00pm End time: 1:18pm  Total time spent on patient care (including telephone call/ virtual visit): 22 minutes  [place], [place] [phone]

## 2019-12-21 ENCOUNTER — Ambulatory Visit: Payer: Medicare Other | Admitting: Family Medicine

## 2020-01-08 ENCOUNTER — Ambulatory Visit: Payer: Medicare Other | Admitting: Family Medicine

## 2020-02-01 ENCOUNTER — Other Ambulatory Visit: Payer: Self-pay | Admitting: Family Medicine

## 2020-02-05 ENCOUNTER — Telehealth: Payer: Self-pay | Admitting: Family Medicine

## 2020-02-05 ENCOUNTER — Other Ambulatory Visit: Payer: Self-pay | Admitting: *Deleted

## 2020-02-05 MED ORDER — CETIRIZINE HCL 5 MG PO TABS: 5.0000 mg | ORAL_TABLET | Freq: Every day | ORAL | 3 refills | Status: DC

## 2020-02-05 NOTE — Telephone Encounter (Signed)
Ok to take both technically but would recommend trying ONLY zyrtec 5mg  and if does not control symptoms, can add claritin 5mg  back during the am. Ok to send to [facility].

## 2020-02-05 NOTE — Telephone Encounter (Signed)
Has generic claritin 10 mg on medication list. Zyrtec 5 mg sent to [facility].

## 2020-02-05 NOTE — Telephone Encounter (Signed)
[name] (pt's daughter) Hulen Skains stating that patient was told by Dr Lajuana Ripple that patient could take Zyrtec but no more than 5mg . Says she also takes a generic Claritin. Is it ok for her to take both? If so, wants [place] to call [place] and add it to patients medication list of what she can take. (can call patient or patients son if needed. Daughter aware that she is not on pt's HIPAA currently)

## 2020-02-09 ENCOUNTER — Other Ambulatory Visit: Payer: Self-pay | Admitting: Family Medicine

## 2020-02-09 DIAGNOSIS — J019 Acute sinusitis, unspecified: Secondary | ICD-10-CM

## 2020-02-10 ENCOUNTER — Other Ambulatory Visit: Payer: Self-pay | Admitting: Family Medicine

## 2020-02-19 ENCOUNTER — Ambulatory Visit (INDEPENDENT_AMBULATORY_CARE_PROVIDER_SITE_OTHER): Payer: Medicare Other | Admitting: Family Medicine

## 2020-02-19 ENCOUNTER — Encounter: Payer: Self-pay | Admitting: Family Medicine

## 2020-02-19 ENCOUNTER — Other Ambulatory Visit: Payer: Self-pay

## 2020-02-19 VITALS — BP 142/78 | HR 77 | Temp 98.6°F | Ht 59.0 in | Wt 141.0 lb

## 2020-02-19 DIAGNOSIS — H9193 Unspecified hearing loss, bilateral: Secondary | ICD-10-CM

## 2020-02-19 DIAGNOSIS — E038 Other specified hypothyroidism: Secondary | ICD-10-CM | POA: Diagnosis not present

## 2020-02-19 DIAGNOSIS — G40909 Epilepsy, unspecified, not intractable, without status epilepticus: Secondary | ICD-10-CM | POA: Diagnosis not present

## 2020-02-19 DIAGNOSIS — Z789 Other specified health status: Secondary | ICD-10-CM

## 2020-02-19 DIAGNOSIS — S51811A Laceration without foreign body of right forearm, initial encounter: Secondary | ICD-10-CM

## 2020-02-19 DIAGNOSIS — I10 Essential (primary) hypertension: Secondary | ICD-10-CM | POA: Diagnosis not present

## 2020-02-19 DIAGNOSIS — Z23 Encounter for immunization: Secondary | ICD-10-CM

## 2020-02-19 DIAGNOSIS — Z7409 Other reduced mobility: Secondary | ICD-10-CM | POA: Diagnosis not present

## 2020-02-19 NOTE — Progress Notes (Signed)
Subjective: CC: difficulty hearing ? earwax PCP: Janora Norlander, DO KGM:WNUUV Lai is a 86 y.o. female presenting to clinic today for:  1.  Difficulty hearing Patient reports bilateral difficulty hearing.  She does have hearing aids prescribed but does not wear them because there is always a roaring sound when she tries to use them.  She worries that she has excessive wax buildup and has been using Debrox at home in preparation for today's appointment.  Denies any drainage from the ears.  Hearing aids were prescribed in a nursing home.  She is not established with an audiologist  2.  Physical deconditioning/degenerative changes of the spine Needs a handicap placard filled out.  She utilizes a cane for ambulation.  No recent falls.  3.  Seizure disorder Patient reports being seizure-free.  She continues to use Keppra as directed.  4.  Thyroid disorder Patient reports compliance with Synthroid 75 mcg daily.  No change in weight, difficulty swallowing, change in bowel habits  ROS: Per HPI  Allergies  Allergen Reactions  . Sulfa Antibiotics Shortness Of Breath  . Lorazepam Other (See Comments)    Restless, "fidgety"  . Darvon [Propoxyphene] Hives   Past Medical History:  Diagnosis Date  . GERD (gastroesophageal reflux disease)   . Hypertension   . Seizure ([place])   . Thyroid disease     Current Outpatient Medications:  .  ALLERGY RELIEF 10 MG tablet, TAKE (1) TABLET BY MOUTH ONCE DAILY., Disp: 28 tablet, Rfl: 11 .  amLODipine (NORVASC) 5 MG tablet, Take 1 tablet (5 mg total) by mouth daily., Disp: 30 tablet, Rfl: 12 .  celecoxib (CELEBREX) 100 MG capsule, TAKE 1 CAPSULE BY MOUTH TWICE DAILY., Disp: 56 capsule, Rfl: 11 .  cetirizine (ZYRTEC) 5 MG tablet, Take 1 tablet (5 mg total) by mouth daily., Disp: 90 tablet, Rfl: 3 .  clotrimazole (LOTRIMIN) 1 % cream, APPLY 1 APPLICATION TO THE AFFECTED AREA ON THE GROIN FOR RASH 2 TIMES DAILY FOR 2 WEEKS, Disp: 30 g, Rfl: 11 .   diclofenac Sodium (VOLTAREN) 1 % GEL, APPLY 2 GRAMS TOPICALLY 4 TIMES DAILY, Disp: 200 g, Rfl: 11 .  docusate sodium (COLACE) 100 MG capsule, TAKE (1) CAPSULE BY MOUTH TWICE DAILY AS NEEDED FOR MILD CONSTIPATION., Disp: 60 capsule, Rfl: 11 .  FLORASTOR 250 MG capsule, TAKE 1 CAPSULE BY MOUTH TWICE DAILY., Disp: 56 capsule, Rfl: 11 .  fluticasone (FLONASE) 50 MCG/ACT nasal spray, USE 1 SPRAY IN EACH NOSTRIL DAILY AS NEEDED FOR SEASONAL ALLERGIES & RHINITIS, Disp: 16 g, Rfl: 5 .  furosemide (LASIX) 20 MG tablet, Take 0.5 tablets (10 mg total) by mouth daily., Disp: 15 tablet, Rfl: 12 .  levETIRAcetam (KEPPRA) 500 MG tablet, TAKE 1 TABLET BY MOUTH TWICE DAILY., Disp: 56 tablet, Rfl: 11 .  levothyroxine (SYNTHROID) 75 MCG tablet, TAKE (1) TABLET BY MOUTH ONCE DAILY BEFORE BREAKFAST., Disp: 28 tablet, Rfl: 11 .  MAGNESIUM-OXIDE 400 (241.3 Mg) MG tablet, TAKE (1/2) TABLET (=200MG) BY MOUTH TWICE DAILY., Disp: 28 tablet, Rfl: 11 .  Menthol-Methyl Salicylate (MUSCLE RUB) 10-15 % CREA, Apply 1 application topically 2 (two) times daily as needed for muscle pain., Disp: , Rfl: 0 .  Multiple Vitamins-Minerals (MULTIVITAMIN) tablet, TAKE (1) TABLET BY MOUTH ONCE DAILY., Disp: 30 tablet, Rfl: 0 .  NON-ASPIRIN PAIN RELIEF 325 MG tablet, TAKE (2) TABLETS BY MOUTH TWICE DAILY., Disp: 112 tablet, Rfl: 11 .  omeprazole (PRILOSEC) 20 MG capsule, TAKE 1 CAPSULE BY MOUTH ONCE A  DAY., Disp: 28 capsule, Rfl: 11 .  potassium chloride SA (KLOR-CON) 20 MEQ tablet, TAKE (1) TABLET BY MOUTH ONCE DAILY., Disp: 28 tablet, Rfl: 11 .  Propylene Glycol (SYSTANE COMPLETE) 0.6 % SOLN, Instill 1 drop in each eye  BID, Disp: 15 mL, Rfl: 3 .  SYSTANE 0.4-0.3 % SOLN, INSTILL (1) DROP INTO BOTH EYES TWICE DAILY., Disp: 15 mL, Rfl: 4 Social History   Socioeconomic History  . Marital status: Widowed    Spouse name: Not on file  . Number of children: Not on file  . Years of education: Not on file  . Highest education level: Not on file   Occupational History  . Not on file  Tobacco Use  . Smoking status: Never Smoker  . Smokeless tobacco: Never Used  Substance and Sexual Activity  . Alcohol use: Yes    Alcohol/week: 0.0 standard drinks    Comment: occasional  . Drug use: No  . Sexual activity: Not on file  Other Topics Concern  . 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 Difficulty of Paying Living Expenses:   Food Insecurity:   . Worried About Charity fundraiser in the Last Year:   . Arboriculturist in the Last Year:   Transportation Needs:   . Film/video editor (Medical):   Marland Kitchen Lack of Transportation (Non-Medical):   Physical Activity:   . Days of Exercise per Week:   . Minutes of Exercise per Session:   Stress:   . Feeling of Stress :   Social Connections:   . Frequency of Communication with Friends and Family:   . Frequency of Social Gatherings with Friends and Family:   . Attends Religious Services:   . Active Member of Clubs or Organizations:   . Attends Archivist Meetings:   Marland Kitchen Marital Status:   Intimate Partner Violence:   . Fear of Current or Ex-Partner:   . Emotionally Abused:   Marland Kitchen Physically Abused:   . Sexually Abused:    Family History  Problem Relation Age of Onset  . Heart disease Mother     Objective: Office vital signs reviewed. BP (!) 142/78   Pulse 77   Temp 98.6 F (37 C) (Temporal)   Ht 4' 11"  (1.499 m)   Wt 141 lb (64 kg)   SpO2 98%   BMI 28.48 kg/m   Physical Examination:  General: Awake, alert, well appearing, elderly female, No acute distress HEENT: Normal; no exophthalmos no goiter; TMs intact bilaterally.  Sclerotic changes noted to the TMs.  No significant cerumen buildup. Cardio: regular rate and rhythm, S1S2 heard, no murmurs appreciated Pulm:  clear to auscultation bilaterally, no wheezes, rhonchi or rales; normal work of breathing on room air Extremities: warm, well perfused, No edema, cyanosis or clubbing; +2 pulses bilaterally MSK: slow antalgic gait and station; tone fair Skin: dry; she has a skin tear of the right dorsal forearm that is hemostatic. Neuro: No tremor  Assessment/ Plan: 86 y.o. female   1. Hearing difficulty of both ears I placed a referral back to audiology for further evaluation of hearing and calibration of hearing aids - Ambulatory referral to Audiology  2. Essential hypertension Controlled - CMP14+EGFR  3. Seizure disorder (HCC) Asymptomatic - CMP14+EGFR - CBC with Differential  4. Impaired mobility and ADLs Form completed and returned to her daughter  6. Other specified hypothyroidism Asymptomatic - Thyroid Panel With TSH  6. Skin tear of right forearm without complication, initial encounter Hemostatic.  She is given a tetanus shot today.   No orders of the defined types were placed in this encounter.  No orders of the defined types were placed in this encounter.    Janora Norlander, DO [place] [phone]

## 2020-02-19 NOTE — Addendum Note (Signed)
Addended byCarrolyn Leigh on: 02/19/2020 04:02 PM   Modules accepted: Orders

## 2020-02-19 NOTE — Patient Instructions (Signed)
You got a tetanus shot today.   Skin Tear A skin tear is a wound in which the top layers of skin peel off. This is a common problem for older people. It can also be a problem for people who take certain medicines for too long. To repair the skin, your doctor may use:  Tape.  Skin tape (adhesive) strips. A bandage (dressing) may also be placed over the tape or skin tape strips. Follow these instructions at home: Wound care   Clean the wound as told by your doctor. You may be told to keep the wound dry for the first few days. If you are told to clean the wound: ? Wash the wound with mild soap and water, a wound cleanser, or a salt-water (saline) solution. ? If you use soap, rinse the wound with water to remove all soap. ? Do not rub the wound dry. Pat the wound gently, or let it air dry. ? Keep the bandage dry as told by your doctor.  Change any bandage as told by your doctor. This includes changing the bandage if it gets wet, gets dirty, or starts to smell bad. To change your bandage: ? Wash your hands with soap and water before and after you change your bandage. If you cannot use soap and water, use hand sanitizer. ? Leave tape or skin tape strips in place. They may need to stay in place for 2 weeks or longer. If tape strips get loose and curl up, you may trim the loose edges. Do not remove tape strips completely unless your doctor says it is okay.  Check your wound every day for signs of infection. Check for: ? Redness, swelling, or pain. ? More fluid or blood. ? Warmth. ? Pus or a bad smell.  Do not scratch or pick at the wound.  Protect the injured area until it has healed. Medicines  Take or apply over-the-counter and prescription medicines only as told by your doctor.  If you were prescribed an antibiotic medicine, take or apply it as told by your doctor. Do not stop using the antibiotic even if your condition gets better. General instructions   Keep the bandage dry as  told by your doctor.  Do not take baths, swim, use a hot tub, or do anything that puts your wound underwater until your doctor approves. Ask your doctor if you may take showers. You may only be allowed to take sponge baths.  Keep all follow-up visits as told by your doctor. This is important. Contact a doctor if:  You have redness, swelling, or pain around your wound.  You have more fluid or blood coming from your wound.  Your wound feels warm to the touch.  You have pus or a bad smell coming from your wound. Get help right away if:  You have a red streak that goes away from the skin tear.  You have a fever and chills, and your symptoms get worse all of a sudden. Summary  A skin tear is a wound in which the top layers of skin peel off.  To repair the skin, your doctor may use tape or skin tape strips.  Change any bandage as told by your doctor.  Take or apply over-the-counter and prescription medicines only as told by your doctor.  Contact a doctor if you have signs of infection. This information is not intended to replace advice given to you by your health care provider. Make sure you discuss any questions you have  with your health care provider. Document Revised: 03/05/2019 Document Reviewed: 09/02/2018 Elsevier Patient Education  [place].

## 2020-02-20 LAB — CMP14+EGFR
ALT: 15 IU/L (ref 0–32)
AST: 25 IU/L (ref 0–40)
Albumin/Globulin Ratio: 1.9 (ref 1.2–2.2)
Albumin: 4.2 g/dL (ref 3.5–4.6)
Alkaline Phosphatase: 108 IU/L (ref 39–117)
BUN/Creatinine Ratio: 16 (ref 12–28)
BUN: 14 mg/dL (ref 10–36)
Bilirubin Total: 0.5 mg/dL (ref 0.0–1.2)
CO2: 24 mmol/L (ref 20–29)
Calcium: 9.6 mg/dL (ref 8.7–10.3)
Chloride: 104 mmol/L (ref 96–106)
Creatinine, Ser: 0.88 mg/dL (ref 0.57–1.00)
GFR calc Af Amer: 64 mL/min/{1.73_m2} (ref >59)
GFR calc non Af Amer: 55 mL/min/{1.73_m2} — ABNORMAL LOW (ref >59)
Globulin, Total: 2.2 g/dL (ref 1.5–4.5)
Glucose: 111 mg/dL — ABNORMAL HIGH (ref 65–99)
Potassium: 4.6 mmol/L (ref 3.5–5.2)
Sodium: 141 mmol/L (ref 134–144)
Total Protein: 6.4 g/dL (ref 6.0–8.5)

## 2020-02-20 LAB — CBC WITH DIFFERENTIAL/PLATELET
Basophils Absolute: 0.1 10*3/uL (ref 0.0–0.2)
Basos: 1 %
EOS (ABSOLUTE): 0.2 10*3/uL (ref 0.0–0.4)
Eos: 3 %
Hematocrit: 40.4 % (ref 34.0–46.6)
Hemoglobin: 13.4 g/dL (ref 11.1–15.9)
Immature Grans (Abs): 0 10*3/uL (ref 0.0–0.1)
Immature Granulocytes: 0 %
Lymphocytes Absolute: 2 10*3/uL (ref 0.7–3.1)
Lymphs: 28 %
MCH: 31.7 pg (ref 26.6–33.0)
MCHC: 33.2 g/dL (ref 31.5–35.7)
MCV: 96 fL (ref 79–97)
Monocytes Absolute: 0.8 10*3/uL (ref 0.1–0.9)
Monocytes: 12 %
Neutrophils Absolute: 4 10*3/uL (ref 1.4–7.0)
Neutrophils: 56 %
Platelets: 252 10*3/uL (ref 150–450)
RBC: 4.23 x10E6/uL (ref 3.77–5.28)
RDW: 13.2 % (ref 11.7–15.4)
WBC: 7.1 10*3/uL (ref 3.4–10.8)

## 2020-02-20 LAB — THYROID PANEL WITH TSH
Free Thyroxine Index: 2.1 (ref 1.2–4.9)
T3 Uptake Ratio: 28 % (ref 24–39)
T4, Total: 7.4 ug/dL (ref 4.5–12.0)
TSH: 1.77 u[IU]/mL (ref 0.450–4.500)

## 2020-02-23 ENCOUNTER — Encounter: Payer: Self-pay | Admitting: Family Medicine

## 2020-03-16 ENCOUNTER — Ambulatory Visit: Payer: Medicare Other | Admitting: Family Medicine

## 2020-04-21 ENCOUNTER — Ambulatory Visit: Payer: PPO

## 2020-04-26 ENCOUNTER — Telehealth: Payer: Self-pay

## 2020-04-26 NOTE — Telephone Encounter (Signed)
Samantha Woods's pharmacy does not have the multi vite tablets. Are there other options you wish for the patient to try?

## 2020-04-26 NOTE — Telephone Encounter (Signed)
[name] is ok

## 2020-04-27 NOTE — Telephone Encounter (Signed)
Daughter in law aware of provider's suggestion on vitamin.

## 2020-05-17 ENCOUNTER — Other Ambulatory Visit: Payer: Self-pay | Admitting: Family Medicine

## 2020-05-23 ENCOUNTER — Ambulatory Visit (INDEPENDENT_AMBULATORY_CARE_PROVIDER_SITE_OTHER): Payer: Medicare Other | Admitting: Family Medicine

## 2020-05-23 ENCOUNTER — Encounter: Payer: Self-pay | Admitting: Family Medicine

## 2020-05-23 ENCOUNTER — Other Ambulatory Visit: Payer: Self-pay

## 2020-05-23 VITALS — BP 139/62 | HR 85 | Temp 97.1°F | Ht 59.0 in | Wt 137.8 lb

## 2020-05-23 DIAGNOSIS — Z789 Other specified health status: Secondary | ICD-10-CM

## 2020-05-23 DIAGNOSIS — L03011 Cellulitis of right finger: Secondary | ICD-10-CM

## 2020-05-23 DIAGNOSIS — Z23 Encounter for immunization: Secondary | ICD-10-CM | POA: Diagnosis not present

## 2020-05-23 DIAGNOSIS — E038 Other specified hypothyroidism: Secondary | ICD-10-CM

## 2020-05-23 DIAGNOSIS — Z7409 Other reduced mobility: Secondary | ICD-10-CM | POA: Diagnosis not present

## 2020-05-23 DIAGNOSIS — L729 Follicular cyst of the skin and subcutaneous tissue, unspecified: Secondary | ICD-10-CM

## 2020-05-23 DIAGNOSIS — G40909 Epilepsy, unspecified, not intractable, without status epilepticus: Secondary | ICD-10-CM

## 2020-05-23 MED ORDER — CEFDINIR 300 MG PO CAPS: 300.0000 mg | ORAL_CAPSULE | Freq: Two times a day (BID) | ORAL | 0 refills | Status: DC

## 2020-05-23 NOTE — Patient Instructions (Addendum)
You had labs performed today.  You will be contacted with the results of the labs once they are available, usually in the next 3 business days for routine lab work.  If you have an active my chart account, they will be released to your MyChart.  If you prefer to have these labs released to you via telephone, please let us know.  If you had a pap smear or biopsy performed, expect to be contacted in about 7-10 days.  Referral to dermatology in.  Let them know you are together so they schedule accordingly   Paronychia Paronychia is an infection of the skin. It happens near a fingernail or toenail. It may cause pain and swelling around the nail. In some cases, a fluid-filled bump (abscess) can form near or under the nail. Usually, this condition is not serious, and it clears up with treatment. Follow these instructions at home: Wound care  Keep the affected area clean.  Soak the fingers or toes in warm water as told by your doctor. You may be told to do this for 20 minutes, 2-3 times a day.  Keep the area dry when you are not soaking it.  Do not try to drain a fluid-filled bump on your own.  Follow instructions from your doctor about how to take care of the affected area. Make sure you: ? Wash your hands with soap and water before you change your bandage (dressing). If you cannot use soap and water, use hand sanitizer. ? Change your bandage as told by your doctor.  If you had a fluid-filled bump and your doctor drained it, check the area every day for signs of infection. Check for: ? Redness, swelling, or pain. ? Fluid or blood. ? Warmth. ? Pus or a bad smell. Medicines   Take over-the-counter and prescription medicines only as told by your doctor.  If you were prescribed an antibiotic medicine, take it as told by your doctor. Do not stop taking it even if you start to feel better. General instructions  Avoid touching any chemicals.  Do not pick at the affected  area. Prevention  To prevent this condition from happening again: ? Wear rubber gloves when putting your hands in water for washing dishes or other tasks. ? Wear gloves if your hands might touch cleaners or chemicals. ? Avoid injuring your nails or fingertips. ? Do not bite your nails or tear hangnails. ? Do not cut your nails very short. ? Do not cut the skin at the base and sides of the nail (cuticles). ? Use clean nail clippers or scissors when trimming nails. Contact a doctor if:  You feel worse.  You do not get better.  You have more fluid, blood, or pus coming from the affected area.  Your finger or knuckle is swollen or is hard to move. Get help right away if you have:  A fever or chills.  Redness spreading from the affected area.  Pain in a joint or muscle. Summary  Paronychia is an infection of the skin. It happens near a fingernail or toenail.  This condition may cause pain and swelling around the nail.  Soak the fingers or toes in warm water as told by your doctor.  Usually, this condition is not serious, and it clears up with treatment. This information is not intended to replace advice given to you by your health care provider. Make sure you discuss any questions you have with your health care provider. Document Revised: 11/29/2017 Document Reviewed:  11/25/2017 Elsevier Patient Education  [place].

## 2020-05-23 NOTE — Progress Notes (Signed)
Subjective: CC: difficulty hearing ? earwax PCP: Janora Norlander, DO Samantha Woods is a 86 y.o. female presenting to clinic today for:  1. Physical deconditioning/degenerative changes of the spine Needs a handicap placard filled out again.  She utilizes a cane for ambulation but is unable to ambulate far distances secondary to physical deconditioning and early fatigability.  She would like some home physical exercises to do in a chair if possible.  2.  Seizure disorder Patient continues to be seizure-free and is compliant with Keppra.  She gave blood this morning for laboratory testing  3.  Thyroid disorder Patient reports compliance with Synthroid 75 mcg daily.  She does report fatigue as above.  No tremor, heart palpitations.  4.  Finger infection Patient reports a soft tissue infection on her right middle finger that she lanced herself and express some purulence from it.  She still has some tenderness but no ongoing purulence.  5.  Cysts Patient reports that she has very cysts on her body, 2 on her neck and one under her left axilla.  She self incision and drained the left lesion which had swollen and was irritated quite a while ago.  She is never seen a dermatologist but would like to see one for possible removal.  ROS: Per HPI  Allergies  Allergen Reactions  . Sulfa Antibiotics Shortness Of Breath  . Lorazepam Other (See Comments)    Restless, "fidgety"  . Darvon [Propoxyphene] Hives   Past Medical History:  Diagnosis Date  . GERD (gastroesophageal reflux disease)   . Hypertension   . Seizure ([place])   . Thyroid disease     Current Outpatient Medications:  .  ALLERGY RELIEF 10 MG tablet, TAKE (1) TABLET BY MOUTH ONCE DAILY., Disp: 28 tablet, Rfl: 11 .  amLODipine (NORVASC) 5 MG tablet, Take 1 tablet (5 mg total) by mouth daily., Disp: 30 tablet, Rfl: 12 .  celecoxib (CELEBREX) 100 MG capsule, TAKE 1 CAPSULE BY MOUTH TWICE DAILY., Disp: 56 capsule, Rfl: 11 .   cetirizine (ZYRTEC) 5 MG tablet, Take 1 tablet (5 mg total) by mouth daily., Disp: 90 tablet, Rfl: 3 .  clotrimazole (LOTRIMIN) 1 % cream, APPLY 1 APPLICATION TO THE AFFECTED AREA ON THE GROIN FOR RASH 2 TIMES DAILY FOR 2 WEEKS, Disp: 30 g, Rfl: 11 .  diclofenac Sodium (VOLTAREN) 1 % GEL, APPLY 2 GRAMS TOPICALLY 4 TIMES DAILY, Disp: 200 g, Rfl: 11 .  docusate sodium (COLACE) 100 MG capsule, TAKE (1) CAPSULE BY MOUTH TWICE DAILY AS NEEDED FOR MILD CONSTIPATION., Disp: 60 capsule, Rfl: 11 .  FLORASTOR 250 MG capsule, TAKE 1 CAPSULE BY MOUTH TWICE DAILY., Disp: 56 capsule, Rfl: 11 .  fluticasone (FLONASE) 50 MCG/ACT nasal spray, USE 1 SPRAY IN EACH NOSTRIL DAILY AS NEEDED FOR SEASONAL ALLERGIES & RHINITIS, Disp: 16 g, Rfl: 5 .  furosemide (LASIX) 20 MG tablet, Take 0.5 tablets (10 mg total) by mouth daily., Disp: 15 tablet, Rfl: 12 .  levETIRAcetam (KEPPRA) 500 MG tablet, TAKE 1 TABLET BY MOUTH TWICE DAILY., Disp: 56 tablet, Rfl: 11 .  levothyroxine (SYNTHROID) 75 MCG tablet, TAKE (1) TABLET BY MOUTH ONCE DAILY BEFORE BREAKFAST., Disp: 28 tablet, Rfl: 11 .  MAGNESIUM-OXIDE 400 (241.3 Mg) MG tablet, TAKE (1/2) TABLET (=200MG) BY MOUTH TWICE DAILY., Disp: 28 tablet, Rfl: 11 .  Menthol-Methyl Salicylate (MUSCLE RUB) 10-15 % CREA, Apply 1 application topically 2 (two) times daily as needed for muscle pain., Disp: , Rfl: 0 .  Multiple Vitamins-Minerals (  MULTIVITAMIN) tablet, TAKE (1) TABLET BY MOUTH ONCE DAILY., Disp: 28 tablet, Rfl: 0 .  NON-ASPIRIN PAIN RELIEF 325 MG tablet, TAKE (2) TABLETS BY MOUTH TWICE DAILY., Disp: 112 tablet, Rfl: 11 .  omeprazole (PRILOSEC) 20 MG capsule, TAKE 1 CAPSULE BY MOUTH ONCE A DAY., Disp: 28 capsule, Rfl: 11 .  potassium chloride SA (KLOR-CON) 20 MEQ tablet, TAKE (1) TABLET BY MOUTH ONCE DAILY., Disp: 28 tablet, Rfl: 11 .  Propylene Glycol (SYSTANE COMPLETE) 0.6 % SOLN, Instill 1 drop in each eye  BID, Disp: 15 mL, Rfl: 3 .  SYSTANE 0.4-0.3 % SOLN, INSTILL (1) DROP INTO  BOTH EYES TWICE DAILY., Disp: 15 mL, Rfl: 4 Social History   Socioeconomic History  . Marital status: Widowed    Spouse name: Not on file  . Number of children: Not on file  . Years of education: Not on file  . Highest education level: Not on file  Occupational History  . Not on file  Tobacco Use  . Smoking status: Never Smoker  . Smokeless tobacco: Never Used  Substance and Sexual Activity  . Alcohol use: Yes    Alcohol/week: 0.0 standard drinks    Comment: occasional  . Drug use: No  . Sexual activity: Not on file  Other Topics Concern  . 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 Difficulty of Paying Living Expenses:   Food Insecurity:   . Worried About Charity fundraiser in the Last Year:   . Arboriculturist in the Last Year:   Transportation Needs:   . Film/video editor (Medical):   Marland Kitchen Lack of Transportation (Non-Medical):   Physical Activity:   . Days of Exercise per Week:   . Minutes of Exercise per Session:   Stress:   . Feeling of Stress :   Social Connections:   . Frequency of Communication with Friends and Family:   . Frequency of Social Gatherings with Friends and Family:   . Attends Religious Services:   . Active Member of Clubs or Organizations:   . Attends Archivist Meetings:   Marland Kitchen Marital Status:   Intimate Partner Violence:   . Fear of Current or Ex-Partner:   . Emotionally Abused:   Marland Kitchen Physically Abused:   . Sexually Abused:    Family History  Problem Relation Age of Onset  . Heart disease Mother     Objective: Office vital signs reviewed. BP 139/62   Pulse 85   Temp (!) 97.1 F (36.2 C)   Ht 4' 11"  (1.499 m)   Wt 137 lb 12.8 oz (62.5 kg)   SpO2 94%   BMI 27.83 kg/m   Physical Examination:  General: Awake, alert, well  appearing, elderly female, No acute distress HEENT: Normal; sclera white.  No goiter Cardio: regular rate and rhythm, S1S2 heard, no murmurs appreciated Pulm: clear to auscultation bilaterally, no wheezes, rhonchi or rales; normal work of breathing on room air Extremities: warm, well perfused, No edema, cyanosis or clubbing; +2 pulses bilaterally MSK: slow antalgic gait and station; tone fair Skin: Paronychia noted along the right middle finger. There is no significant erythema or soft tissue swelling now but there is quite a bit of tenderness.  No felon. She has a soft tissue lesion noted along the left lateral base of the neck that seems consistent with a cyst.  There is an epidermal cyst noted along the left axillary region with a visible punctum.   Assessment/ Plan: 86 y.o. female   1. Skin cyst Referral to dermatology placed.  They would like to go to [facility] - Ambulatory referral to Dermatology  2. Need for vaccination against Streptococcus pneumoniae Administered during today's visit - Pneumococcal polysaccharide vaccine 23-valent greater than or equal to 2yo subcutaneous/IM  3. Seizure disorder ([place]) Seizure-free.  Check CBC and CMP - CMP14+EGFR - CBC with Differential  4. Impaired mobility and ADLs Placard form refilled out  5. Other specified hypothyroidism Thyroid panel ordered - Thyroid Panel With TSH  6. Paronychia of finger of right hand Omnicef twice daily.  AVS provided - cefdinir (OMNICEF) 300 MG capsule; Take 1 capsule (300 mg total) by mouth 2 (two) times daily. 1 po BID  Dispense: 20 capsule; Refill: 0   No orders of the defined types were placed in this encounter.  No orders of the defined types were placed in this encounter.    Janora Norlander, DO [place] [phone]

## 2020-05-24 LAB — CBC WITH DIFFERENTIAL/PLATELET
Basophils Absolute: 0.1 10*3/uL (ref 0.0–0.2)
Basos: 1 %
EOS (ABSOLUTE): 0.4 10*3/uL (ref 0.0–0.4)
Eos: 5 %
Hematocrit: 42.1 % (ref 34.0–46.6)
Hemoglobin: 14.2 g/dL (ref 11.1–15.9)
Immature Grans (Abs): 0 10*3/uL (ref 0.0–0.1)
Immature Granulocytes: 0 %
Lymphocytes Absolute: 2.6 10*3/uL (ref 0.7–3.1)
Lymphs: 30 %
MCH: 32.3 pg (ref 26.6–33.0)
MCHC: 33.7 g/dL (ref 31.5–35.7)
MCV: 96 fL (ref 79–97)
Monocytes Absolute: 0.8 10*3/uL (ref 0.1–0.9)
Monocytes: 10 %
Neutrophils Absolute: 4.8 10*3/uL (ref 1.4–7.0)
Neutrophils: 54 %
Platelets: 313 10*3/uL (ref 150–450)
RBC: 4.39 x10E6/uL (ref 3.77–5.28)
RDW: 13 % (ref 11.7–15.4)
WBC: 8.8 10*3/uL (ref 3.4–10.8)

## 2020-05-24 LAB — CMP14+EGFR
ALT: 15 IU/L (ref 0–32)
AST: 26 IU/L (ref 0–40)
Albumin/Globulin Ratio: 1.6 (ref 1.2–2.2)
Albumin: 3.9 g/dL (ref 3.5–4.6)
Alkaline Phosphatase: 145 IU/L — ABNORMAL HIGH (ref 48–121)
BUN/Creatinine Ratio: 19 (ref 12–28)
BUN: 16 mg/dL (ref 10–36)
Bilirubin Total: 0.4 mg/dL (ref 0.0–1.2)
CO2: 26 mmol/L (ref 20–29)
Calcium: 9 mg/dL (ref 8.7–10.3)
Chloride: 104 mmol/L (ref 96–106)
Creatinine, Ser: 0.83 mg/dL (ref 0.57–1.00)
GFR calc Af Amer: 68 mL/min/{1.73_m2} (ref >59)
GFR calc non Af Amer: 59 mL/min/{1.73_m2} — ABNORMAL LOW (ref >59)
Globulin, Total: 2.5 g/dL (ref 1.5–4.5)
Glucose: 87 mg/dL (ref 65–99)
Potassium: 4.9 mmol/L (ref 3.5–5.2)
Sodium: 143 mmol/L (ref 134–144)
Total Protein: 6.4 g/dL (ref 6.0–8.5)

## 2020-05-24 LAB — THYROID PANEL WITH TSH
Free Thyroxine Index: 1.9 (ref 1.2–4.9)
T3 Uptake Ratio: 28 % (ref 24–39)
T4, Total: 6.8 ug/dL (ref 4.5–12.0)
TSH: 3.08 u[IU]/mL (ref 0.450–4.500)

## 2020-06-01 ENCOUNTER — Telehealth: Payer: Self-pay

## 2020-06-01 ENCOUNTER — Other Ambulatory Visit: Payer: Self-pay | Admitting: Family Medicine

## 2020-06-01 DIAGNOSIS — L602 Onychogryphosis: Secondary | ICD-10-CM

## 2020-06-01 NOTE — Telephone Encounter (Signed)
Done. If has preference of where they go, please cc to referral coordinator

## 2020-06-01 NOTE — Telephone Encounter (Signed)
Patient's daughter [name] called and is requesting referral to podiatrist for nail trimming and possible foot fungus.  Patient has not been seen in our office for this issue and daughter states they forgot to request at time of LOV last week. Please review and place referral if approved.

## 2020-06-02 NOTE — Telephone Encounter (Signed)
Please refer to [facility] , Dr. Paulla Dolly for foot care.

## 2020-06-06 ENCOUNTER — Encounter: Payer: Self-pay | Admitting: Family Medicine

## 2020-06-06 ENCOUNTER — Ambulatory Visit: Payer: Medicare Other | Admitting: Family Medicine

## 2020-06-09 DIAGNOSIS — L720 Epidermal cyst: Secondary | ICD-10-CM | POA: Diagnosis not present

## 2020-06-09 DIAGNOSIS — L820 Inflamed seborrheic keratosis: Secondary | ICD-10-CM | POA: Diagnosis not present

## 2020-06-17 ENCOUNTER — Other Ambulatory Visit: Payer: Self-pay | Admitting: Family Medicine

## 2020-06-20 ENCOUNTER — Ambulatory Visit: Payer: Medicare Other | Admitting: Podiatry

## 2020-06-20 ENCOUNTER — Other Ambulatory Visit: Payer: Self-pay

## 2020-06-20 ENCOUNTER — Encounter: Payer: Self-pay | Admitting: Podiatry

## 2020-06-20 VITALS — Temp 98.0°F

## 2020-06-20 DIAGNOSIS — B351 Tinea unguium: Secondary | ICD-10-CM | POA: Diagnosis not present

## 2020-06-20 DIAGNOSIS — M79675 Pain in left toe(s): Secondary | ICD-10-CM | POA: Diagnosis not present

## 2020-06-20 DIAGNOSIS — M79674 Pain in right toe(s): Secondary | ICD-10-CM

## 2020-06-20 NOTE — Progress Notes (Signed)
Subjective:   Patient ID: Samantha Woods, female   DOB: 86 y.o.   MRN: [id number]   HPI Patient presents with caregiver with painful nailbeds of both feet and states that they cannot take care of themselves.  Patient does not smoke and is not significantly active at the current time   Review of Systems  All other systems reviewed and are negative.       Objective:  Physical Exam Vitals and nursing note reviewed.  Constitutional:      Appearance: She is well-developed.  Pulmonary:     Effort: Pulmonary effort is normal.  Musculoskeletal:        General: Normal range of motion.  Skin:    General: Skin is warm.  Neurological:     Mental Status: She is alert.     Neurovascular status was found to be intact with muscle strength found to be diminished and range of motion diminished bilateral.  Patient is found to have thick yellow brittle nailbeds 1-5 both feet which become tender and make it hard for her to walk comfortably.  Patient does have good digital perfusion and is well oriented and does have caregiver     Assessment:  Mycotic nail infection 1-5 both feet with pain     Plan:  Reviewed condition and explained condition to patient and today I went ahead and I debrided the nailbeds 1-5 both feet with no iatrogenic bleeding and I instructed this patient on routine nail care which can be done into the future as needed

## 2020-07-20 ENCOUNTER — Ambulatory Visit: Payer: Medicare Other

## 2020-07-21 ENCOUNTER — Other Ambulatory Visit: Payer: Self-pay | Admitting: Family Medicine

## 2020-08-18 ENCOUNTER — Other Ambulatory Visit: Payer: Self-pay | Admitting: Family Medicine

## 2020-09-23 ENCOUNTER — Ambulatory Visit (INDEPENDENT_AMBULATORY_CARE_PROVIDER_SITE_OTHER): Payer: Medicare Other | Admitting: Family Medicine

## 2020-09-23 ENCOUNTER — Other Ambulatory Visit: Payer: Self-pay

## 2020-09-23 VITALS — BP 111/64 | HR 67 | Temp 98.0°F | Wt 136.4 lb

## 2020-09-23 DIAGNOSIS — Z23 Encounter for immunization: Secondary | ICD-10-CM

## 2020-09-23 DIAGNOSIS — Z7409 Other reduced mobility: Secondary | ICD-10-CM

## 2020-09-23 DIAGNOSIS — G40909 Epilepsy, unspecified, not intractable, without status epilepticus: Secondary | ICD-10-CM

## 2020-09-23 DIAGNOSIS — Z789 Other specified health status: Secondary | ICD-10-CM

## 2020-09-23 DIAGNOSIS — E875 Hyperkalemia: Secondary | ICD-10-CM

## 2020-09-23 DIAGNOSIS — I10 Essential (primary) hypertension: Secondary | ICD-10-CM | POA: Diagnosis not present

## 2020-09-23 DIAGNOSIS — E038 Other specified hypothyroidism: Secondary | ICD-10-CM

## 2020-09-23 NOTE — Progress Notes (Signed)
Subjective: CC: f/u seizures, HTN, hypothyroidism PCP: Janora Norlander, DO Samantha Woods is a 86 y.o. female who is accompanied today's visit by her daughter, Samantha Woods.  She is presenting to clinic today for:  1.  Seizure disorder Thankfully she has had no seizure activity since her last visit.  She is compliant with Keppra.  2.  Hypertension She is compliant with her antihypertensives.  No chest pain, shortness of breath, dizziness or falls.   3.  Hypothyroidism Denies any tremor, heart palpitations, change in bowel habits.  Compliant with Synthroid 75 mcg daily    ROS: Per HPI  Allergies  Allergen Reactions  . Sulfa Antibiotics Shortness Of Breath  . Lorazepam Other (See Comments)    Restless, "fidgety"  . Darvon [Propoxyphene] Hives   Past Medical History:  Diagnosis Date  . GERD (gastroesophageal reflux disease)   . Hypertension   . Seizure ([place])   . Thyroid disease     Current Outpatient Medications:  .  ALLERGY RELIEF 10 MG tablet, TAKE (1) TABLET BY MOUTH ONCE DAILY., Disp: 28 tablet, Rfl: 11 .  amLODipine (NORVASC) 5 MG tablet, TAKE (1) TABLET BY MOUTH ONCE DAILY., Disp: 30 tablet, Rfl: 2 .  cefdinir (OMNICEF) 300 MG capsule, Take 1 capsule (300 mg total) by mouth 2 (two) times daily. 1 po BID, Disp: 20 capsule, Rfl: 0 .  celecoxib (CELEBREX) 100 MG capsule, TAKE 1 CAPSULE BY MOUTH TWICE DAILY., Disp: 56 capsule, Rfl: 11 .  cetirizine (ZYRTEC) 5 MG tablet, Take 1 tablet (5 mg total) by mouth daily., Disp: 90 tablet, Rfl: 3 .  clotrimazole (LOTRIMIN) 1 % cream, APPLY 1 APPLICATION TO THE AFFECTED AREA ON THE GROIN FOR RASH 2 TIMES DAILY FOR 2 WEEKS, Disp: 30 g, Rfl: 11 .  diclofenac Sodium (VOLTAREN) 1 % GEL, APPLY 2 GRAMS TOPICALLY 4 TIMES DAILY, Disp: 200 g, Rfl: 11 .  docusate sodium (COLACE) 100 MG capsule, TAKE (1) CAPSULE BY MOUTH TWICE DAILY AS NEEDED FOR MILD CONSTIPATION., Disp: 60 capsule, Rfl: 11 .  FLORASTOR 250 MG capsule, TAKE 1 CAPSULE  BY MOUTH TWICE DAILY., Disp: 56 capsule, Rfl: 11 .  fluticasone (FLONASE) 50 MCG/ACT nasal spray, USE 1 SPRAY IN EACH NOSTRIL DAILY AS NEEDED FOR SEASONAL ALLERGIES & RHINITIS, Disp: 16 g, Rfl: 5 .  furosemide (LASIX) 20 MG tablet, TAKE (1/2) TABLET BY MOUTH ONCE DAILY., Disp: 14 tablet, Rfl: 11 .  levETIRAcetam (KEPPRA) 500 MG tablet, TAKE 1 TABLET BY MOUTH TWICE DAILY., Disp: 56 tablet, Rfl: 11 .  levothyroxine (SYNTHROID) 75 MCG tablet, TAKE (1) TABLET BY MOUTH ONCE DAILY BEFORE BREAKFAST., Disp: 28 tablet, Rfl: 11 .  MAGNESIUM-OXIDE 400 (241.3 Mg) MG tablet, TAKE (1/2) TABLET (=200MG) BY MOUTH TWICE DAILY., Disp: 28 tablet, Rfl: 11 .  Menthol-Methyl Salicylate (MUSCLE RUB) 10-15 % CREA, Apply 1 application topically 2 (two) times daily as needed for muscle pain., Disp: , Rfl: 0 .  Multiple Vitamins-Minerals (MULTIVITAMIN) tablet, TAKE (1) TABLET BY MOUTH ONCE DAILY., Disp: 56 tablet, Rfl: 6 .  NON-ASPIRIN PAIN RELIEF 325 MG tablet, TAKE (2) TABLETS BY MOUTH TWICE DAILY., Disp: 112 tablet, Rfl: 11 .  omeprazole (PRILOSEC) 20 MG capsule, TAKE 1 CAPSULE BY MOUTH ONCE A DAY., Disp: 28 capsule, Rfl: 11 .  potassium chloride SA (KLOR-CON) 20 MEQ tablet, TAKE (1) TABLET BY MOUTH ONCE DAILY., Disp: 28 tablet, Rfl: 11 .  Propylene Glycol (SYSTANE COMPLETE) 0.6 % SOLN, Instill 1 drop in each eye  BID, Disp: 15  mL, Rfl: 3 .  SYSTANE 0.4-0.3 % SOLN, INSTILL (1) DROP INTO BOTH EYES TWICE DAILY., Disp: 15 mL, Rfl: 4 Social History   Socioeconomic History  . Marital status: Widowed    Spouse name: Not on file  . Number of children: Not on file  . Years of education: Not on file  . Highest education level: Not on file  Occupational History  . Not on file  Tobacco Use  . Smoking status: Never Smoker  . Smokeless tobacco: Never Used  Substance and Sexual Activity  . Alcohol use: Yes    Alcohol/week: 0.0 standard drinks    Comment: occasional  . Drug use: No  . Sexual activity: Not on file  Other  Topics Concern  . 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 Difficulty of Paying Living Expenses: Not on file  Food Insecurity:   . Worried About Charity fundraiser in the Last Year: Not on file  . Ran Out of Food in the Last Year: Not on file  Transportation Needs:   . Lack of Transportation (Medical): Not on file  . Lack of Transportation (Non-Medical): Not on file  Physical Activity:   . Days of Exercise per Week: Not on file  . Minutes of Exercise per Session: Not on file  Stress:   . Feeling of Stress : Not on file  Social Connections:   . Frequency of Communication with Friends and Family: Not on file  . Frequency of Social Gatherings with Friends and Family: Not on file  . Attends Religious Services: Not on file  . Active Member of Clubs or Organizations: Not on file  . Attends Archivist Meetings: Not on file  . Marital Status: Not on file  Intimate Partner Violence:   . Fear of Current or Ex-Partner: Not on file  . Emotionally Abused: Not on file  . Physically Abused: Not on file  . Sexually Abused: Not on file   Family History  Problem Relation Age of Onset  . Heart disease Mother     Objective: Office vital signs reviewed. BP 111/64   Pulse 67   Temp 98 F (36.7 C)   Wt 136 lb 6.4 oz (61.9 kg)   SpO2 97%   BMI 27.55 kg/m   Physical Examination:  General: Awake, alert, well nourished, No acute distress HEENT: Normal, sclera white.  No exophthalmos.  No goiter Cardio: regular rate and rhythm, S1S2 heard, no murmurs appreciated Pulm: clear to auscultation bilaterally, no wheezes, rhonchi or rales; normal work of breathing on room air Extremities: warm, well perfused, No edema, cyanosis or clubbing; +2 pulses bilaterally MSK: Requires assistance  for ambulation  Assessment/ Plan: 86 y.o. female   1. Seizure disorder ([place]) Asymptomatic.  Check labs - CMP14+EGFR - CBC  2. Impaired mobility and ADLs Continue to use assistive device.  3. Other specified hypothyroidism Asymptomatic. - Thyroid Panel With TSH  4. Essential hypertension Controlled - CMP14+EGFR  5. Need for immunization against influenza Administered - Flu Vaccine QUAD High Dose(Fluad)   No orders of the defined types were placed in this encounter.  No orders of the defined types were placed in this encounter.    Janora Norlander, DO  [place] [phone]

## 2020-09-24 LAB — CMP14+EGFR
ALT: 16 IU/L (ref 0–32)
AST: 28 IU/L (ref 0–40)
Albumin/Globulin Ratio: 1.5 (ref 1.2–2.2)
Albumin: 4.1 g/dL (ref 3.5–4.6)
Alkaline Phosphatase: 109 IU/L (ref 44–121)
BUN/Creatinine Ratio: 18 (ref 12–28)
BUN: 18 mg/dL (ref 10–36)
Bilirubin Total: 0.4 mg/dL (ref 0.0–1.2)
CO2: 25 mmol/L (ref 20–29)
Calcium: 9.9 mg/dL (ref 8.7–10.3)
Chloride: 104 mmol/L (ref 96–106)
Creatinine, Ser: 1.02 mg/dL — ABNORMAL HIGH (ref 0.57–1.00)
GFR calc Af Amer: 53 mL/min/{1.73_m2} — ABNORMAL LOW (ref >59)
GFR calc non Af Amer: 46 mL/min/{1.73_m2} — ABNORMAL LOW (ref >59)
Globulin, Total: 2.7 g/dL (ref 1.5–4.5)
Glucose: 87 mg/dL (ref 65–99)
Potassium: 5.4 mmol/L — ABNORMAL HIGH (ref 3.5–5.2)
Sodium: 143 mmol/L (ref 134–144)
Total Protein: 6.8 g/dL (ref 6.0–8.5)

## 2020-09-24 LAB — THYROID PANEL WITH TSH
Free Thyroxine Index: 2.2 (ref 1.2–4.9)
T3 Uptake Ratio: 31 % (ref 24–39)
T4, Total: 7.2 ug/dL (ref 4.5–12.0)
TSH: 1.88 u[IU]/mL (ref 0.450–4.500)

## 2020-09-24 LAB — CBC
Hematocrit: 39.4 % (ref 34.0–46.6)
Hemoglobin: 13.5 g/dL (ref 11.1–15.9)
MCH: 32.5 pg (ref 26.6–33.0)
MCHC: 34.3 g/dL (ref 31.5–35.7)
MCV: 95 fL (ref 79–97)
Platelets: 255 10*3/uL (ref 150–450)
RBC: 4.16 x10E6/uL (ref 3.77–5.28)
RDW: 12.9 % (ref 11.7–15.4)
WBC: 8.8 10*3/uL (ref 3.4–10.8)

## 2020-09-26 NOTE — Addendum Note (Signed)
Addended byCarrolyn Leigh on: 09/26/2020 11:29 AM   Modules accepted: Orders

## 2020-09-27 ENCOUNTER — Ambulatory Visit: Payer: Medicare Other | Admitting: Podiatry

## 2020-10-14 ENCOUNTER — Other Ambulatory Visit: Payer: Self-pay

## 2020-10-14 ENCOUNTER — Other Ambulatory Visit: Payer: Medicare Other

## 2020-10-14 DIAGNOSIS — E875 Hyperkalemia: Secondary | ICD-10-CM

## 2020-10-14 NOTE — Progress Notes (Signed)
b

## 2020-10-15 LAB — BASIC METABOLIC PANEL
BUN/Creatinine Ratio: 20 (ref 12–28)
BUN: 19 mg/dL (ref 10–36)
CO2: 28 mmol/L (ref 20–29)
Calcium: 9.5 mg/dL (ref 8.7–10.3)
Chloride: 104 mmol/L (ref 96–106)
Creatinine, Ser: 0.94 mg/dL (ref 0.57–1.00)
GFR calc Af Amer: 59 mL/min/{1.73_m2} — ABNORMAL LOW (ref >59)
GFR calc non Af Amer: 51 mL/min/{1.73_m2} — ABNORMAL LOW (ref >59)
Glucose: 91 mg/dL (ref 65–99)
Potassium: 4.2 mmol/L (ref 3.5–5.2)
Sodium: 142 mmol/L (ref 134–144)

## 2020-10-18 ENCOUNTER — Other Ambulatory Visit: Payer: Self-pay | Admitting: Family Medicine

## 2020-11-01 ENCOUNTER — Telehealth: Payer: Self-pay

## 2020-11-01 NOTE — Telephone Encounter (Signed)
Patient had her potassium checked on 10/29 and it was 5.4- advised to hold potassium and have it re checked. Re checked on 11/19 and potassium was 4.2- within normal range Advise if patient should stay of potassium or go back on it?  Lab note did not state

## 2020-11-01 NOTE — Telephone Encounter (Signed)
She have a specific question about the potassium?  It was normal last check in November.  Yes she may [place] take Tylenol if she needs to with her Covid shot today

## 2020-11-02 NOTE — Telephone Encounter (Signed)
Caregiver notified. Contact [facility] to put a hold on potassium at this time

## 2020-11-02 NOTE — Telephone Encounter (Signed)
Clarified with caregiver. She has not been taking the potassium since the normal lab back in November. They want to know if she should continue with med?

## 2020-11-02 NOTE — Telephone Encounter (Signed)
For now i'd stay off it since her potassium got high previously.  Glad to recheck it if she wants to double check it is not now going low.

## 2020-11-05 ENCOUNTER — Other Ambulatory Visit: Payer: Self-pay | Admitting: Family Medicine

## 2020-11-07 ENCOUNTER — Other Ambulatory Visit: Payer: Self-pay | Admitting: Family Medicine

## 2020-11-23 ENCOUNTER — Ambulatory Visit: Payer: Medicare Other | Admitting: Podiatry

## 2020-11-23 ENCOUNTER — Encounter: Payer: Self-pay | Admitting: Podiatry

## 2020-11-23 ENCOUNTER — Other Ambulatory Visit: Payer: Self-pay

## 2020-11-23 DIAGNOSIS — M79674 Pain in right toe(s): Secondary | ICD-10-CM | POA: Diagnosis not present

## 2020-11-23 DIAGNOSIS — B351 Tinea unguium: Secondary | ICD-10-CM

## 2020-11-23 DIAGNOSIS — M79675 Pain in left toe(s): Secondary | ICD-10-CM

## 2020-11-23 NOTE — Progress Notes (Signed)
This patient returns to the office for evaluation and treatment of long thick painful nails .  This patient is unable to trim his own nails since the patient cannot reach her feet.  Patient says the nails are painful walking and wearing her shoes.  He returns for preventive foot care services.  General Appearance  Alert, conversant and in no acute stress.  Vascular  Dorsalis pedis and posterior tibial  pulses are palpable  bilaterally.  Capillary return is within normal limits  bilaterally. Temperature is within normal limits  bilaterally.  Neurologic  Senn-Weinstein monofilament wire test within normal limits  bilaterally. Muscle power within normal limits bilaterally.  Nails Thick disfigured discolored nails with subungual debris  from hallux to fifth toes bilaterally. No evidence of bacterial infection or drainage bilaterally.  Orthopedic  No limitations of motion  feet .  No crepitus or effusions noted.  No bony pathology or digital deformities noted.  Skin  normotropic skin with no porokeratosis noted bilaterally.  No signs of infections or ulcers noted.     Onychomycosis  Pain in toes right foot  Pain in toes left foot  Debridement  of nails  1-5  B/L with a nail nipper.  Nails were then filed using a dremel tool with no incidents.    RTC 3 months    Helane Gunther DPM

## 2020-12-13 ENCOUNTER — Encounter: Payer: Self-pay | Admitting: *Deleted

## 2021-01-05 ENCOUNTER — Other Ambulatory Visit: Payer: Self-pay | Admitting: Family Medicine

## 2021-01-23 ENCOUNTER — Ambulatory Visit: Payer: Medicare Other | Admitting: Family Medicine

## 2021-01-25 ENCOUNTER — Ambulatory Visit: Payer: Medicare Other | Admitting: Family Medicine

## 2021-02-02 ENCOUNTER — Other Ambulatory Visit: Payer: Self-pay | Admitting: Family Medicine

## 2021-02-08 ENCOUNTER — Other Ambulatory Visit: Payer: Self-pay

## 2021-02-08 ENCOUNTER — Ambulatory Visit (INDEPENDENT_AMBULATORY_CARE_PROVIDER_SITE_OTHER): Payer: Medicare Other | Admitting: Family Medicine

## 2021-02-08 ENCOUNTER — Encounter: Payer: Self-pay | Admitting: Family Medicine

## 2021-02-08 VITALS — BP 117/56 | HR 72 | Temp 98.1°F | Ht 59.0 in | Wt 136.0 lb

## 2021-02-08 DIAGNOSIS — Z789 Other specified health status: Secondary | ICD-10-CM

## 2021-02-08 DIAGNOSIS — J301 Allergic rhinitis due to pollen: Secondary | ICD-10-CM

## 2021-02-08 DIAGNOSIS — Z7409 Other reduced mobility: Secondary | ICD-10-CM | POA: Diagnosis not present

## 2021-02-08 DIAGNOSIS — M8949 Other hypertrophic osteoarthropathy, multiple sites: Secondary | ICD-10-CM

## 2021-02-08 DIAGNOSIS — G40909 Epilepsy, unspecified, not intractable, without status epilepticus: Secondary | ICD-10-CM

## 2021-02-08 DIAGNOSIS — M159 Polyosteoarthritis, unspecified: Secondary | ICD-10-CM

## 2021-02-08 DIAGNOSIS — Z1211 Encounter for screening for malignant neoplasm of colon: Secondary | ICD-10-CM | POA: Diagnosis not present

## 2021-02-08 DIAGNOSIS — E038 Other specified hypothyroidism: Secondary | ICD-10-CM | POA: Diagnosis not present

## 2021-02-08 NOTE — Patient Instructions (Signed)
Consider heating pad/ blanket to warm your joints in the morning.  Nasal saline mist (ayr/ ocean mist) would be helpful for the nasal congestion Humidifier Local honey to reduce allergen Mucinex (PLAIN) will help with mucus too.

## 2021-02-08 NOTE — Progress Notes (Signed)
Subjective: CC: Nasal congestion, osteoarthritis PCP: Janora Norlander, DO SMO:LMBEM Devins is a 87 y.o. female presenting to clinic today for:  1.  Nasal congestion Since the turn of the season, patient has been experiencing increasing nasal congestion and increased phlegm.  She is compliant with nasal spray, Zyrtec and hydrates adequately.  She is brought to the office by her daughter today who has been experiencing similar.  No fevers.  No hemoptysis.  She is otherwise acting her normal self.  2.  Joint stiffness/OA Patient with known osteoarthritis.  She is been having some generalized aches and pains in her joints that are usually well controlled with her Celebrex and topical Voltaren gel.  She more notes that she has been experiencing joint stiffness that is prominent in the morning time.  Heat seems to relieve the stiffness.  When she is up and about the stiffness is absent.  Denies any GI bleeding with the Celebrex.  No chest pain.   ROS: Per HPI  Allergies  Allergen Reactions   Sulfa Antibiotics Shortness Of Breath   Lorazepam Other (See Comments)    Restless, "fidgety"   Other    Darvon [Propoxyphene] Hives   Past Medical History:  Diagnosis Date   GERD (gastroesophageal reflux disease)    Hypertension    Seizure (HCC)    Thyroid disease     Current Outpatient Medications:    acetaminophen (NON-ASPIRIN PAIN RELIEF) 325 MG tablet, TAKE (2) TABLETS BY MOUTH TWICE DAILY., Disp: 112 tablet, Rfl: 11   ALLERGY RELIEF 10 MG tablet, TAKE (1) TABLET BY MOUTH ONCE DAILY., Disp: 28 tablet, Rfl: 0   amLODipine (NORVASC) 5 MG tablet, TAKE (1) TABLET BY MOUTH ONCE DAILY., Disp: 28 tablet, Rfl: 2   celecoxib (CELEBREX) 100 MG capsule, TAKE 1 CAPSULE BY MOUTH TWICE DAILY., Disp: 56 capsule, Rfl: 0   cetirizine (ZYRTEC) 10 MG tablet, TAKE (1/2) TABLET BY MOUTH ONCE DAILY., Disp: 15 tablet, Rfl: 5   clotrimazole (LOTRIMIN) 1 % cream, APPLY 1 APPLICATION TO THE  AFFECTED AREA ON THE GROIN FOR RASH 2 TIMES DAILY FOR 2 WEEKS, Disp: 28 g, Rfl: 11   diclofenac Sodium (VOLTAREN) 1 % GEL, APPLY 2 GRAMS TOPICALLY 4 TIMES DAILY, Disp: 200 g, Rfl: 11   docusate sodium (COLACE) 100 MG capsule, TAKE (1) CAPSULE BY MOUTH TWICE DAILY AS NEEDED FOR MILD CONSTIPATION., Disp: 60 capsule, Rfl: 0   FLORASTOR 250 MG capsule, TAKE 1 CAPSULE BY MOUTH TWICE DAILY., Disp: 56 capsule, Rfl: 0   fluticasone (FLONASE) 50 MCG/ACT nasal spray, USE 1 SPRAY IN EACH NOSTRIL DAILY AS NEEDED FOR SEASONAL ALLERGIES & RHINITIS, Disp: 16 g, Rfl: 5   furosemide (LASIX) 20 MG tablet, TAKE (1/2) TABLET BY MOUTH ONCE DAILY., Disp: 14 tablet, Rfl: 11   levETIRAcetam (KEPPRA) 500 MG tablet, TAKE 1 TABLET BY MOUTH TWICE DAILY., Disp: 56 tablet, Rfl: 0   levothyroxine (SYNTHROID) 75 MCG tablet, TAKE (1) TABLET BY MOUTH ONCE DAILY BEFORE BREAKFAST., Disp: 28 tablet, Rfl: 11   MAGNESIUM-OXIDE 400 (241.3 Mg) MG tablet, TAKE (1/2) TABLET (=200MG) BY MOUTH TWICE DAILY., Disp: 28 tablet, Rfl: 0   Menthol-Methyl Salicylate (MUSCLE RUB) 10-15 % CREA, Apply 1 application topically 2 (two) times daily as needed for muscle pain., Disp: , Rfl: 0   Multiple Vitamins-Minerals (MULTIVITAMIN) tablet, TAKE (1) TABLET BY MOUTH ONCE DAILY., Disp: 56 tablet, Rfl: 6   omeprazole (PRILOSEC) 20 MG capsule, TAKE 1 CAPSULE BY MOUTH ONCE A DAY., Disp: 28 capsule,  Rfl: 0   potassium chloride SA (KLOR-CON) 20 MEQ tablet, TAKE (1) TABLET BY MOUTH ONCE DAILY., Disp: 28 tablet, Rfl: 11   Propylene Glycol (SYSTANE COMPLETE) 0.6 % SOLN, Instill 1 drop in each eye  BID, Disp: 15 mL, Rfl: 3   SYSTANE 0.4-0.3 % SOLN, INSTILL (1) DROP INTO BOTH EYES TWICE DAILY., Disp: 15 mL, Rfl: 4 Social History   Socioeconomic History   Marital status: Widowed    Spouse name: Not on file   Number of children: Not on file   Years of education: Not on file   Highest education level: Not on file  Occupational History   Not on  file  Tobacco Use   Smoking status: Never Smoker   Smokeless tobacco: Never Used  Substance and Sexual Activity   Alcohol use: Yes    Alcohol/week: 0.0 standard drinks    Comment: occasional   Drug use: No   Sexual activity: Not on file  Other Topics Concern   Not on file  Social History Narrative   Pt lives in a 1 story home with her son and his wife   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 BP (!) 117/56    Pulse 72    Temp 98.1 F (36.7 C) (Temporal)    Ht 4' 11"  (1.499 m)    Wt 136 lb (61.7 kg)    BMI 27.47 kg/m   Physical Examination:  General: Awake, alert, well appearing elderly female, No acute distress HEENT: Normal; can hear wheezing in her neck secondary to phlegm.  Mucous membranes are moist.  No appreciable exophthalmos Cardio: regular rate and rhythm, S1S2 heard, no murmurs appreciated Pulm: Upper airway transmission of sounds.  No wheezes, rhonchi or rales; normal work of breathing on room air MSK: Degenerative changes noted throughout bilateral hands.  She has a stiff, slow antalgic gait.  Utilizes cane for ambulation  Assessment/ Plan: 87 y.o. female   Other specified hypothyroidism - Plan: TSH, T4, Free  Primary osteoarthritis involving multiple joints  Impaired mobility and ADLs  Seizure disorder (HCC) - Plan: CMP14+EGFR, CBC with Differential, Levetiracetam level, Ambulatory referral to Neurology  Seasonal allergic rhinitis due to pollen  Screen for colon cancer - Plan: Fecal occult blood, imunochemical  Check TSH,  T4.  She is asymptomatic from a thyroid standpoint  Advised use of heating pad since this seems to improve joint stiffness.  Okay to continue current regimen.  Try to stay physically active as this will improve mobility.  Asymptomatic from a seizure standpoint.  I have referred her back to Dr. Delice Lesch, who unfortunately she was lost to follow-up during COVID-19.  CMP, CBC and Keppra level ordered  Advised to use local honey to help with allergy, humidification and nasal saline may also be of benefit  Family requesting FOBT but patient is not experiencing any type of GI bleed.  No orders of the defined types were placed in this encounter.  No orders of the defined types were placed in this encounter.  Janora Norlander, DO [place] [phone]

## 2021-02-09 DIAGNOSIS — Z1211 Encounter for screening for malignant neoplasm of colon: Secondary | ICD-10-CM | POA: Diagnosis not present

## 2021-02-10 ENCOUNTER — Encounter: Payer: Self-pay | Admitting: Neurology

## 2021-02-10 NOTE — Addendum Note (Signed)
Addended by: Liliane Bade on: 02/10/2021 02:00 PM   Modules accepted: Orders

## 2021-02-12 LAB — CMP14+EGFR
ALT: 17 IU/L (ref 0–32)
AST: 29 IU/L (ref 0–40)
Albumin/Globulin Ratio: 1.5 (ref 1.2–2.2)
Albumin: 4.1 g/dL (ref 3.5–4.6)
Alkaline Phosphatase: 108 IU/L (ref 44–121)
BUN/Creatinine Ratio: 15 (ref 12–28)
BUN: 16 mg/dL (ref 10–36)
Bilirubin Total: 0.6 mg/dL (ref 0.0–1.2)
CO2: 27 mmol/L (ref 20–29)
Calcium: 9.8 mg/dL (ref 8.7–10.3)
Chloride: 103 mmol/L (ref 96–106)
Creatinine, Ser: 1.04 mg/dL — ABNORMAL HIGH (ref 0.57–1.00)
Globulin, Total: 2.7 g/dL (ref 1.5–4.5)
Glucose: 110 mg/dL — ABNORMAL HIGH (ref 65–99)
Potassium: 5.3 mmol/L — ABNORMAL HIGH (ref 3.5–5.2)
Sodium: 143 mmol/L (ref 134–144)
Total Protein: 6.8 g/dL (ref 6.0–8.5)
eGFR: 49 mL/min/{1.73_m2} — ABNORMAL LOW (ref >59)

## 2021-02-12 LAB — CBC WITH DIFFERENTIAL/PLATELET
Basophils Absolute: 0.1 10*3/uL (ref 0.0–0.2)
Basos: 1 %
EOS (ABSOLUTE): 0.2 10*3/uL (ref 0.0–0.4)
Eos: 3 %
Hematocrit: 40.9 % (ref 34.0–46.6)
Hemoglobin: 13.7 g/dL (ref 11.1–15.9)
Immature Grans (Abs): 0 10*3/uL (ref 0.0–0.1)
Immature Granulocytes: 0 %
Lymphocytes Absolute: 2.3 10*3/uL (ref 0.7–3.1)
Lymphs: 30 %
MCH: 31.4 pg (ref 26.6–33.0)
MCHC: 33.5 g/dL (ref 31.5–35.7)
MCV: 94 fL (ref 79–97)
Monocytes Absolute: 0.8 10*3/uL (ref 0.1–0.9)
Monocytes: 10 %
Neutrophils Absolute: 4.2 10*3/uL (ref 1.4–7.0)
Neutrophils: 56 %
Platelets: 281 10*3/uL (ref 150–450)
RBC: 4.36 x10E6/uL (ref 3.77–5.28)
RDW: 12.8 % (ref 11.7–15.4)
WBC: 7.7 10*3/uL (ref 3.4–10.8)

## 2021-02-12 LAB — LEVETIRACETAM LEVEL: Levetiracetam Lvl: 36.9 ug/mL (ref 10.0–40.0)

## 2021-02-12 LAB — T4, FREE: Free T4: 1.27 ng/dL (ref 0.82–1.77)

## 2021-02-12 LAB — TSH: TSH: 2.2 u[IU]/mL (ref 0.450–4.500)

## 2021-02-14 LAB — FECAL OCCULT BLOOD, IMMUNOCHEMICAL: Fecal Occult Bld: NEGATIVE

## 2021-02-16 ENCOUNTER — Other Ambulatory Visit: Payer: Self-pay | Admitting: Family Medicine

## 2021-02-16 DIAGNOSIS — J019 Acute sinusitis, unspecified: Secondary | ICD-10-CM

## 2021-02-22 ENCOUNTER — Encounter: Payer: Self-pay | Admitting: Podiatry

## 2021-02-22 ENCOUNTER — Other Ambulatory Visit: Payer: Self-pay

## 2021-02-22 ENCOUNTER — Ambulatory Visit: Payer: Medicare Other | Admitting: Podiatry

## 2021-02-22 DIAGNOSIS — B351 Tinea unguium: Secondary | ICD-10-CM

## 2021-02-22 DIAGNOSIS — M79674 Pain in right toe(s): Secondary | ICD-10-CM | POA: Diagnosis not present

## 2021-02-22 DIAGNOSIS — M79675 Pain in left toe(s): Secondary | ICD-10-CM | POA: Diagnosis not present

## 2021-02-22 NOTE — Progress Notes (Signed)
This patient returns to the office for evaluation and treatment of long thick painful nails .  This patient is unable to trim her own nails since the patient cannot reach her feet.  Patient says the nails are painful walking and wearing her shoes.  He returns for preventive foot care services.  General Appearance  Alert, conversant and in no acute stress.  Vascular  Dorsalis pedis and posterior tibial  pulses are palpable  bilaterally.  Capillary return is within normal limits  bilaterally. Temperature is within normal limits  bilaterally.  Neurologic  Senn-Weinstein monofilament wire test within normal limits  bilaterally. Muscle power within normal limits bilaterally.  Nails Thick disfigured discolored nails with subungual debris  from hallux to fifth toes bilaterally. No evidence of bacterial infection or drainage bilaterally.  Orthopedic  No limitations of motion  feet .  No crepitus or effusions noted.  No bony pathology or digital deformities noted.  Skin  normotropic skin with no porokeratosis noted bilaterally.  No signs of infections or ulcers noted.     Onychomycosis  Pain in toes right foot  Pain in toes left foot  Debridement  of nails  1-5  B/L with a nail nipper.  Nails were then filed using a dremel tool with no incidents.    RTC 3 months    Gardiner Barefoot DPM

## 2021-03-01 ENCOUNTER — Other Ambulatory Visit: Payer: Self-pay | Admitting: Family Medicine

## 2021-04-21 ENCOUNTER — Ambulatory Visit: Payer: Medicare Other | Admitting: Neurology

## 2021-04-27 ENCOUNTER — Other Ambulatory Visit: Payer: Self-pay | Admitting: Family Medicine

## 2021-05-16 ENCOUNTER — Other Ambulatory Visit: Payer: Self-pay | Admitting: Family Medicine

## 2021-05-26 ENCOUNTER — Encounter: Payer: Self-pay | Admitting: Neurology

## 2021-05-26 ENCOUNTER — Other Ambulatory Visit: Payer: Self-pay

## 2021-05-26 ENCOUNTER — Ambulatory Visit (INDEPENDENT_AMBULATORY_CARE_PROVIDER_SITE_OTHER): Payer: Medicare Other | Admitting: Neurology

## 2021-05-26 ENCOUNTER — Other Ambulatory Visit: Payer: Self-pay | Admitting: Family Medicine

## 2021-05-26 VITALS — BP 105/69 | HR 65 | Ht 60.0 in | Wt 135.0 lb

## 2021-05-26 DIAGNOSIS — G40009 Localization-related (focal) (partial) idiopathic epilepsy and epileptic syndromes with seizures of localized onset, not intractable, without status epilepticus: Secondary | ICD-10-CM | POA: Diagnosis not present

## 2021-05-26 MED ORDER — LEVETIRACETAM 500 MG PO TABS: 500.0000 mg | ORAL_TABLET | Freq: Two times a day (BID) | ORAL | 3 refills | Status: DC

## 2021-05-26 NOTE — Progress Notes (Signed)
NEUROLOGY FOLLOW UP OFFICE NOTE  Samantha Woods [id number] [birth year]  HISTORY OF PRESENT ILLNESS: I had the pleasure of seeing Samantha Woods in follow-up in the neurology clinic on 05/26/2021.  She is accompanied by her daughter Samantha Woods who helps supplement the history today. The patient was last seen in March 2019 for seizure. She had 2 seizures in October 2018, workup unremarkable. On her last visit in 2019, Levetiracetam dose was reduced to 250mg  BID due to overall lethargy ("feeling slower"). They report that when dose was reduced, she had another seizure and increased back to 500mg  BID. No further seizures since 2019. She denies any side effects on medications. She lives with her children, alternating between [place]. Medications come in pillpacks, she manages her own medications and denies missing doses. Family manages finances. She does not drive. They deny any staring/unresponsive episodes, gaps in time, olfactory/gustatory hallucinations, focal weakness, myoclonic jerks. She has paresthesias in the left hand between the 2nd and 3rd digits which she describes as "burns like fire, not all the time." No falls, her left knee turns in when walking. Sleep is good.   History on Initial Assessment 10/30/2017: This is a very pleasant 87 yo RH woman with a history of hypertension, hypothyroidism, with new onset seizure last 09/18/2017. She recalls she was crocheting, bent down to get something, then had no recollection of subsequent events until she was in the hospital. On further questioning, she does not remember much of her hospital stay. Her son reports she does not recall much of being in a SNF afterwards either. Her son came home that day and found her on the floor. She was not talking for more than a day. [facility] records reviewed, she was lethargic, confused, non-verbal. CT perfusion did not show any evidence of stroke. She was started on empiric antibiotics and lumbar puncture was planned. Prior  to LP being performed, she had a seizure and was given 1.5mg  Ativan. She had an MRI brain which I personally reviewed, no acute changes seen. EEG showed diffuse slowing, no epileptiform discharges seen. Her LP was overall unremarkable except for an elevated protein of 62. Urine culture positive for Klebsiella. It was felt that seizure was secondary to UTI, but at age 49, seizure predisposition is likely, and further seizures could occur with future infections, hence she was discharged on Keppra 500mg  BID.    There have been no further similar spells since October. No prior similar episodes in the past. She has been living with her son for the past 3 years, and he reports that prior to October, she had been doing pretty well, in charge of her own medications, pulling weeds in the yard. There has been a big change since return home, her son now puts her pills in a pillbox and sets a timer for her, which she does fine with. She cannot remember things for any length of time. She is still not able to walk very far, she now has to use a walker at all times (previously only used prn cane). She did break her collarbone when she fell, but denies any pain. She feels her left side is weaker. She reports that this is chronic, she has always had to lift her left leg to get into a car. She has pain in her left knee and had an injection before her hospitalization. Her son reports she is not walking like she normally did.   She has occasional sharp pains behind the eyes. She has some  dizziness upon standing. There is occasional numbness in her fingertips and both feet. She used to crochet a lot, but not like before per son. He has to help her into the shower but she can bathe herself. She needs help lifting her left leg in and out of the shower. She is able to dress independently. She has some pain under the right side of her chest when combing her hair. She stopped driving at age 8. Her son is in charge of finances. She has  urinary frequency, her son has been waking her up at 3am to use the bathroom, otherwise she would wet the bed. Since hospital discharge, she has been sleeping "all the time." Her son denies any staring/unresponsive episodes. She denies any olfactory/gustatory hallucinations, deja vu, rising epigastric sensation, myoclonic jerks.   Epilepsy Risk Factors:  Her youngest sister had a seizure while pregnant. Otherwise she had a normal birth and early development.  There is no history of febrile convulsions, CNS infections such as meningitis/encephalitis, significant traumatic brain injury, neurosurgical procedures.  PAST MEDICAL HISTORY: Past Medical History:  Diagnosis Date   GERD (gastroesophageal reflux disease)    Hypertension    Seizure ([place])    Thyroid disease     MEDICATIONS: Current Outpatient Medications on File Prior to Visit  Medication Sig Dispense Refill   acetaminophen (NON-ASPIRIN PAIN RELIEF) 325 MG tablet TAKE (2) TABLETS BY MOUTH TWICE DAILY. 112 tablet 11   ALLERGY RELIEF 10 MG tablet TAKE (1) TABLET BY MOUTH ONCE DAILY. 28 tablet 2   amLODipine (NORVASC) 5 MG tablet TAKE (1) TABLET BY MOUTH ONCE DAILY. 28 tablet 5   celecoxib (CELEBREX) 100 MG capsule TAKE 1 CAPSULE BY MOUTH TWICE DAILY. 56 capsule 2   cetirizine (ZYRTEC) 10 MG tablet TAKE (1/2) TABLET BY MOUTH ONCE DAILY. 15 tablet 5   clotrimazole (LOTRIMIN) 1 % cream APPLY 1 APPLICATION TO THE AFFECTED AREA ON THE GROIN FOR RASH 2 TIMES DAILY FOR 2 WEEKS 28 g 11   docusate sodium (COLACE) 100 MG capsule TAKE (1) CAPSULE BY MOUTH TWICE DAILY AS NEEDED FOR MILD CONSTIPATION. 60 capsule 2   FLORASTOR 250 MG capsule TAKE 1 CAPSULE BY MOUTH TWICE DAILY. 60 capsule 4   fluticasone (FLONASE) 50 MCG/ACT nasal spray USE 1 SPRAY IN EACH NOSTRIL DAILY AS NEEDED FOR SEASONAL ALLERGIES & RHINITIS 16 g 5   furosemide (LASIX) 20 MG tablet TAKE (1/2) TABLET BY MOUTH ONCE DAILY. 14 tablet 11   levETIRAcetam (KEPPRA) 500 MG tablet TAKE 1  TABLET BY MOUTH TWICE DAILY. 56 tablet 2   levothyroxine (SYNTHROID) 75 MCG tablet TAKE (1) TABLET BY MOUTH ONCE DAILY BEFORE BREAKFAST. 28 tablet 11   MAGNESIUM-OXIDE 400 (240 Mg) MG tablet TAKE (1/2) TABLET (=200MG ) BY MOUTH TWICE DAILY. 28 tablet 2   MAGNESIUM-OXIDE 400 (241.3 Mg) MG tablet TAKE (1/2) TABLET (=200MG ) BY MOUTH TWICE DAILY. 28 tablet 0   Menthol-Methyl Salicylate (MUSCLE RUB) 10-15 % CREA Apply 1 application topically 2 (two) times daily as needed for muscle pain.  0   Multiple Vitamins-Minerals (MULTIVITAMIN) tablet TAKE (1) TABLET BY MOUTH ONCE DAILY. 56 tablet 6   omeprazole (PRILOSEC) 20 MG capsule TAKE 1 CAPSULE BY MOUTH ONCE A DAY. 28 capsule 2   Propylene Glycol (SYSTANE COMPLETE) 0.6 % SOLN Instill 1 drop in each eye  BID 15 mL 3   SYSTANE 0.4-0.3 % SOLN INSTILL (1) DROP INTO BOTH EYES TWICE DAILY. 15 mL 4   No current facility-administered medications on  file prior to visit.    ALLERGIES: Allergies  Allergen Reactions   Sulfa Antibiotics Shortness Of Breath   Lorazepam Other (See Comments)    Restless, "fidgety"   Other    Darvon [Propoxyphene] Hives    FAMILY HISTORY: Family History  Problem Relation Age of Onset   Heart disease Mother     SOCIAL HISTORY: Social History   Socioeconomic History   Marital status: Widowed    Spouse name: Not on file   Number of children: Not on file   Years of education: Not on file   Highest education level: Not on file  Occupational History   Not on file  Tobacco Use   Smoking status: Never   Smokeless tobacco: Never  Substance and Sexual Activity   Alcohol use: Yes    Alcohol/week: 0.0 standard drinks    Comment: occasional   Drug use: No   Sexual activity: Not on file  Other Topics Concern   Not on file  Social History Narrative   Pt lives in a 1 story home with her son and his wife   Has 3 adult children   Programmer, 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 No aphasia or dysarthria. Fund of knowledge is appropriate.   Attention and concentration are normal.   Cranial nerves: Pupils equal, round. Extraocular movements intact with no nystagmus. Visual fields full.  No facial asymmetry.  Motor: Bulk and tone normal, muscle strength 5/5 throughout with no pronator drift.   Finger to nose testing intact.  Gait slow and cautious favoring left leg, no ataxia   IMPRESSION: This is a pleasant 86 yo RH woman with a history of hypertension, hypothyroidism, who had a seizure in 08/2017 in the setting of a UTI. MRI brain no acute changes, EEG showed diffuse slowing. She was started on Levetiracetam but had side effects initially on 500mg  BID, however with dose reduction, she had another seizure in 2019. No further seizures since 2019 on Levetiracetam 500mg , they deny any further side effects. Refills sent. She does not drive. Follow-up in 1 year, call for any changes.    Thank you for allowing me to participate in her care.  Please do not hesitate to call for any questions or concerns.   Ellouise Newer, M.D.   CC: Dr. Lajuana Ripple

## 2021-05-26 NOTE — Patient Instructions (Signed)
Good to see you again. Continue Keppra 500mg  twice a day. Follow-up in 1 year, call for any changes.   Seizure Precautions: 1. If medication has been prescribed for you to prevent seizures, take it exactly as directed.  Do not stop taking the medicine without talking to your doctor first, even if you have not had a seizure in a long time.   2. Avoid activities in which a seizure would cause danger to yourself or to others.  Don't operate dangerous machinery, swim alone, or climb in high or dangerous places, such as on ladders, roofs, or girders.  Do not drive unless your doctor says you may.  3. If you have any warning that you may have a seizure, lay down in a safe place where you can't hurt yourself.    4.  No driving for 6 months from last seizure, as per [facility].   Please refer to the following link on the [facility] website for more information: [email or web address]   5.  Maintain good sleep hygiene.  6.  Contact your doctor if you have any problems that may be related to the medicine you are taking.  7.  Call 911 and bring the patient back to the ED if:        A.  The seizure lasts longer than 5 minutes.       B.  The patient doesn't awaken shortly after the seizure  C.  The patient has new problems such as difficulty seeing, speaking or moving  D.  The patient was injured during the seizure  E.  The patient has a temperature over 102 F (39C)  F.  The patient vomited and now is having trouble breathing

## 2021-05-31 ENCOUNTER — Other Ambulatory Visit: Payer: Self-pay

## 2021-05-31 ENCOUNTER — Ambulatory Visit: Payer: Medicare Other | Admitting: Podiatry

## 2021-05-31 ENCOUNTER — Encounter: Payer: Self-pay | Admitting: Podiatry

## 2021-05-31 DIAGNOSIS — B351 Tinea unguium: Secondary | ICD-10-CM

## 2021-05-31 DIAGNOSIS — M79675 Pain in left toe(s): Secondary | ICD-10-CM

## 2021-05-31 DIAGNOSIS — M79674 Pain in right toe(s): Secondary | ICD-10-CM | POA: Diagnosis not present

## 2021-05-31 NOTE — Progress Notes (Signed)
This patient returns to the office for evaluation and treatment of long thick painful nails .  This patient is unable to trim her own nails since the patient cannot reach her feet.  Patient says the nails are painful walking and wearing her shoes.  She returns for preventive foot care services.  She is accompanied by female caregiver.  General Appearance  Alert, conversant and in no acute stress.  Vascular  Dorsalis pedis and posterior tibial  pulses are   weakly  palpable  bilaterally.  Capillary return is within normal limits  bilaterally. Cold feet. bilaterally.  Neurologic  Senn-Weinstein monofilament wire test within normal limits  bilaterally. Muscle power within normal limits bilaterally.  Nails Thick disfigured discolored nails with subungual debris  from hallux to fifth toes bilaterally. No evidence of bacterial infection or drainage bilaterally.  Orthopedic  No limitations of motion  feet .  No crepitus or effusions noted.  No bony pathology or digital deformities noted.  Skin  normotropic skin with no porokeratosis noted bilaterally.  No signs of infections or ulcers noted.     Onychomycosis  Pain in toes right foot  Pain in toes left foot  Debridement  of nails  1-5  B/L with a nail nipper.  Nails were then filed using a dremel tool with no incidents.    RTC 3 months    Gardiner Barefoot DPM

## 2021-07-24 ENCOUNTER — Other Ambulatory Visit: Payer: Self-pay | Admitting: Family Medicine

## 2021-08-14 ENCOUNTER — Ambulatory Visit: Payer: Medicare Other | Admitting: Family Medicine

## 2021-08-18 ENCOUNTER — Other Ambulatory Visit: Payer: Self-pay | Admitting: Family Medicine

## 2021-08-23 ENCOUNTER — Telehealth: Payer: Self-pay | Admitting: Family Medicine

## 2021-08-23 DIAGNOSIS — E039 Hypothyroidism, unspecified: Secondary | ICD-10-CM

## 2021-08-23 DIAGNOSIS — I10 Essential (primary) hypertension: Secondary | ICD-10-CM

## 2021-08-25 ENCOUNTER — Ambulatory Visit: Payer: Medicare Other | Admitting: Family Medicine

## 2021-08-29 ENCOUNTER — Other Ambulatory Visit: Payer: Self-pay

## 2021-08-29 ENCOUNTER — Ambulatory Visit: Payer: Medicare Other | Admitting: Podiatry

## 2021-08-29 ENCOUNTER — Encounter: Payer: Self-pay | Admitting: Podiatry

## 2021-08-29 DIAGNOSIS — M79674 Pain in right toe(s): Secondary | ICD-10-CM | POA: Diagnosis not present

## 2021-08-29 DIAGNOSIS — M79675 Pain in left toe(s): Secondary | ICD-10-CM | POA: Diagnosis not present

## 2021-08-29 DIAGNOSIS — B351 Tinea unguium: Secondary | ICD-10-CM

## 2021-08-29 NOTE — Progress Notes (Signed)
This patient returns to the office for evaluation and treatment of long thick painful nails .  This patient is unable to trim her own nails since the patient cannot reach her feet.  Patient says the nails are painful walking and wearing her shoes.  She returns for preventive foot care services.  She is accompanied by female caregiver.  General Appearance  Alert, conversant and in no acute stress.  Vascular  Dorsalis pedis and posterior tibial  pulses are   weakly  palpable  bilaterally.  Capillary return is within normal limits  bilaterally. Cold feet. bilaterally.  Neurologic  Senn-Weinstein monofilament wire test within normal limits  bilaterally. Muscle power within normal limits bilaterally.  Nails Thick disfigured discolored nails with subungual debris  from hallux to fifth toes bilaterally. No evidence of bacterial infection or drainage bilaterally.  Orthopedic  No limitations of motion  feet .  No crepitus or effusions noted.  No bony pathology or digital deformities noted.  Skin  normotropic skin with no porokeratosis noted bilaterally.  No signs of infections or ulcers noted.     Onychomycosis  Pain in toes right foot  Pain in toes left foot  Debridement  of nails  1-5  B/L with a nail nipper.  Nails were then filed using a dremel tool with no incidents.    RTC 3 months    Gardiner Barefoot DPM

## 2021-09-05 ENCOUNTER — Other Ambulatory Visit: Payer: Medicare Other

## 2021-09-05 ENCOUNTER — Other Ambulatory Visit: Payer: Self-pay

## 2021-09-05 DIAGNOSIS — I10 Essential (primary) hypertension: Secondary | ICD-10-CM | POA: Diagnosis not present

## 2021-09-05 DIAGNOSIS — E039 Hypothyroidism, unspecified: Secondary | ICD-10-CM | POA: Diagnosis not present

## 2021-09-06 LAB — BMP8+EGFR
BUN/Creatinine Ratio: 20 (ref 12–28)
BUN: 17 mg/dL (ref 10–36)
CO2: 28 mmol/L (ref 20–29)
Calcium: 9.3 mg/dL (ref 8.7–10.3)
Chloride: 103 mmol/L (ref 96–106)
Creatinine, Ser: 0.83 mg/dL (ref 0.57–1.00)
Glucose: 88 mg/dL (ref 70–99)
Potassium: 4.5 mmol/L (ref 3.5–5.2)
Sodium: 142 mmol/L (ref 134–144)
eGFR: 64 mL/min/{1.73_m2} (ref >59)

## 2021-09-06 LAB — TSH: TSH: 2.3 u[IU]/mL (ref 0.450–4.500)

## 2021-09-06 LAB — T4, FREE: Free T4: 1.2 ng/dL (ref 0.82–1.77)

## 2021-09-07 ENCOUNTER — Encounter: Payer: Self-pay | Admitting: Family Medicine

## 2021-09-07 ENCOUNTER — Other Ambulatory Visit: Payer: Self-pay

## 2021-09-07 ENCOUNTER — Ambulatory Visit (INDEPENDENT_AMBULATORY_CARE_PROVIDER_SITE_OTHER): Payer: Medicare Other | Admitting: Family Medicine

## 2021-09-07 VITALS — BP 106/64 | HR 70 | Temp 97.2°F | Ht 60.0 in | Wt 133.6 lb

## 2021-09-07 DIAGNOSIS — M159 Polyosteoarthritis, unspecified: Secondary | ICD-10-CM | POA: Diagnosis not present

## 2021-09-07 DIAGNOSIS — Z789 Other specified health status: Secondary | ICD-10-CM | POA: Diagnosis not present

## 2021-09-07 DIAGNOSIS — Z7409 Other reduced mobility: Secondary | ICD-10-CM | POA: Diagnosis not present

## 2021-09-07 DIAGNOSIS — E038 Other specified hypothyroidism: Secondary | ICD-10-CM

## 2021-09-07 DIAGNOSIS — H159 Unspecified disorder of sclera: Secondary | ICD-10-CM | POA: Diagnosis not present

## 2021-09-07 DIAGNOSIS — N3941 Urge incontinence: Secondary | ICD-10-CM

## 2021-09-07 DIAGNOSIS — I10 Essential (primary) hypertension: Secondary | ICD-10-CM

## 2021-09-07 DIAGNOSIS — Z23 Encounter for immunization: Secondary | ICD-10-CM | POA: Diagnosis not present

## 2021-09-07 DIAGNOSIS — J301 Allergic rhinitis due to pollen: Secondary | ICD-10-CM

## 2021-09-07 NOTE — Patient Instructions (Signed)
Blood pressure is borderline too low.  HOLD Amlodipine.  Watch blood pressure at home.  Goal less 150/90 but above 110/60. HOLD clariting (loratidine).  In combo with Zyrtec, probably too much. Heating blanket to help with joint stiffness Ears were clear

## 2021-09-07 NOTE — Progress Notes (Signed)
Subjective: CC: Urinary incontinence, arthritis, hypothyroidism, hypertension PCP: Janora Norlander, DO Samantha Woods is a 87 y.o. female presenting to clinic today for:  1.  Urinary incontinence She reports urge incontinence that occurs at nighttime.  She never can quite make it to the restroom.  She tries not to use adult depends because they cause some chafing.  2.  Osteoarthritis Has osteoarthritis.  Has Celebrex on hand if needed.  Renal function has been normal.  Has not utilize any heat therapy because she was worried about causing issues with warming blanket in the setting of urinary incontinence at nighttime  3.  Hypothyroidism Compliant with Synthroid.  Reports of tremor, heart palpitations.  Her daughter did note occasional irregular rhythm notification when monitoring her blood pressure at home but this is never sustained  4.  Allergic rhinitis Her daughter reports that she is taking Claritin and half a tablet of Zyrtec.  She continues to have some mucus drainage that occurs overnight and is most prevalent in the morning.  Sometimes she has a difficult time getting the mucus out.  She does use a humidifier at night.  Is using nasal spray as well.  5.  Ocular lesions Her daughter noticed some spots on her eyes.  Not sure if they have been there for a while or not.  Patient denies any visual disturbances.  She will be scheduling an eye appointment with her ophthalmologist soon.  6. ?  Earwax Patient was seen by her audiologist recently and was told that she had hard piece of earwax that almost looked plastic like.  She wanted to have that evaluated today.  She has been utilizing some type of ear oil since that visit.  Has chronic issues with hearing and is not wearing her hearing aids today  ROS: Per HPI  Allergies  Allergen Reactions   Sulfa Antibiotics Shortness Of Breath   Lorazepam Other (See Comments)    Restless, "fidgety"   Other    Darvon [Propoxyphene]  Hives   Past Medical History:  Diagnosis Date   GERD (gastroesophageal reflux disease)    Hypertension    Seizure ([facility])    Thyroid disease     Current Outpatient Medications:    acetaminophen (NON-ASPIRIN PAIN RELIEF) 325 MG tablet, TAKE (2) TABLETS BY MOUTH TWICE DAILY., Disp: 112 tablet, Rfl: 11   ALLERGY RELIEF 10 MG tablet, TAKE (1) TABLET BY MOUTH ONCE DAILY., Disp: 28 tablet, Rfl: 2   amLODipine (NORVASC) 5 MG tablet, TAKE (1) TABLET BY MOUTH ONCE DAILY., Disp: 28 tablet, Rfl: 0   celecoxib (CELEBREX) 100 MG capsule, TAKE 1 CAPSULE BY MOUTH TWICE DAILY., Disp: 56 capsule, Rfl: 0   cetirizine (ZYRTEC) 10 MG tablet, TAKE (1/2) TABLET BY MOUTH ONCE DAILY., Disp: 15 tablet, Rfl: 5   clotrimazole (LOTRIMIN) 1 % cream, APPLY 1 APPLICATION TO THE AFFECTED AREA ON THE GROIN FOR RASH 2 TIMES DAILY FOR 2 WEEKS, Disp: 28 g, Rfl: 11   diclofenac Sodium (VOLTAREN) 1 % GEL, APPLY 2 GRAMS TOPICALLY 4 TIMES DAILY, Disp: 224 g, Rfl: 11   docusate sodium (COLACE) 100 MG capsule, TAKE (1) CAPSULE BY MOUTH TWICE DAILY AS NEEDED FOR MILD CONSTIPATION., Disp: 60 capsule, Rfl: 2   FLORASTOR 250 MG capsule, TAKE 1 CAPSULE BY MOUTH TWICE DAILY., Disp: 56 capsule, Rfl: 0   fluticasone (FLONASE) 50 MCG/ACT nasal spray, USE 1 SPRAY IN EACH NOSTRIL DAILY AS NEEDED FOR SEASONAL ALLERGIES & RHINITIS, Disp: 16 g, Rfl: 5  furosemide (LASIX) 20 MG tablet, TAKE (1/2) TABLET BY MOUTH ONCE DAILY., Disp: 25 tablet, Rfl: 0   levETIRAcetam (KEPPRA) 500 MG tablet, Take 1 tablet (500 mg total) by mouth 2 (two) times daily., Disp: 180 tablet, Rfl: 3   levothyroxine (SYNTHROID) 75 MCG tablet, TAKE (1) TABLET BY MOUTH ONCE DAILY BEFORE BREAKFAST., Disp: 28 tablet, Rfl: 11   Menthol-Methyl Salicylate (MUSCLE RUB) 10-15 % CREA, Apply 1 application topically 2 (two) times daily as needed for muscle pain., Disp: , Rfl: 0   Multiple Vitamins-Minerals (MULTIVITAMIN) tablet, TAKE (1) TABLET BY MOUTH ONCE DAILY., Disp: 56 tablet, Rfl:  6   omeprazole (PRILOSEC) 20 MG capsule, TAKE 1 CAPSULE BY MOUTH ONCE A DAY., Disp: 28 capsule, Rfl: 0   potassium chloride SA (KLOR-CON) 20 MEQ tablet, Take 20 mEq by mouth daily., Disp: , Rfl:    Propylene Glycol (SYSTANE COMPLETE) 0.6 % SOLN, Instill 1 drop in each eye  BID, Disp: 15 mL, Rfl: 3   SYSTANE 0.4-0.3 % SOLN, INSTILL (1) DROP INTO BOTH EYES TWICE DAILY., Disp: 15 mL, Rfl: 4 Social History   Socioeconomic History   Marital status: Widowed    Spouse name: Not on file   Number of children: Not on file   Years of education: Not on file   Highest education level: Not on file  Occupational History   Not on file  Tobacco Use   Smoking status: Never   Smokeless tobacco: Never  Vaping Use   Vaping Use: Never used  Substance and Sexual Activity   Alcohol use: Yes    Alcohol/week: 0.0 standard drinks    Comment: occasional   Drug use: No   Sexual activity: Not on file  Other Topics Concern   Not on file  Social History Narrative   Pt lives in a 1 story home with her son and his wife   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 BP 106/64   Pulse 70   Temp (!) 97.2 F (36.2 C)   Ht 5' (1.524 m)   Wt 133 lb 9.6 oz (60.6 kg)   SpO2 94%   BMI 26.09 kg/m   Physical Examination:  General: Awake, alert, well nourished, No acute distress HEENT: Normal, sclera with bluish pigment noted at the inner corners bilaterally.  PERRLA.  EOMI.  No exophthalmos Cardio: regular rate and rhythm, S1S2 heard, no murmurs appreciated Pulm: clear to auscultation  bilaterally, no wheezes, rhonchi or rales; normal work of breathing on room air Extremities: warm, well perfused, No edema, cyanosis or clubbing; +2 pulses bilaterally MSK: Impaired gait and mobility.  Utilizing cane for ambulation  Assessment/ Plan: 87 y.o. female   Essential hypertension  Other specified hypothyroidism  Primary osteoarthritis involving multiple joints  Impaired mobility and ADLs  Urge incontinence of urine  Need for immunization against influenza - Plan: Flu Vaccine QUAD High Dose(Fluad)  Scleral lesion  Seasonal allergic rhinitis due to pollen  Blood pressure borderline low.  For this reason I am asking her  to discontinue the Norvasc for now.  Monitor blood pressures.  Goal blood pressure less than 150/90.  If remains below 150/90 over the next couple weeks, no need to resume.  We will contact [facility] to ask them to take this out of her pill packs  Thyroid levels were normal.  No changes  Ongoing osteoarthritis of multiple joints.  Recommended heat therapy.  They will consider purchasing an electric blanket  Having what sounds like urge incontinence.  Recommended use of Chux pads as adult depends tend to shave her.  Influenza vaccination administered  Scleral lesions of uncertain etiology.  She has an appointment with Dr. Marin Comment soon, who have reached out to him to further address these. ?  Melanocytes  Recommend use of Zyrtec or Claritin only but not in combination as this is likely excessively drying to the patient.  No orders of the defined types were placed in this encounter.  No orders of the defined types were placed in this encounter.    Janora Norlander, DO [place] [phone]

## 2021-09-18 ENCOUNTER — Other Ambulatory Visit: Payer: Self-pay | Admitting: Family Medicine

## 2021-10-04 ENCOUNTER — Telehealth: Payer: Self-pay | Admitting: Family Medicine

## 2021-10-04 NOTE — Telephone Encounter (Signed)
Left message for patient to call back and schedule Medicare Annual Wellness Visit (AWV) either virtually or in office. I left both office number and my number [phone]  *due 11/26/2009 awvi per [facility]   please schedule at anytime with health coach  This should be a 45 minute visit.

## 2021-10-12 ENCOUNTER — Other Ambulatory Visit: Payer: Self-pay | Admitting: Family Medicine

## 2021-10-13 ENCOUNTER — Other Ambulatory Visit: Payer: Self-pay | Admitting: Family Medicine

## 2021-11-29 ENCOUNTER — Other Ambulatory Visit: Payer: Self-pay

## 2021-11-29 ENCOUNTER — Ambulatory Visit: Payer: Medicare Other | Admitting: Podiatry

## 2021-11-29 ENCOUNTER — Encounter: Payer: Self-pay | Admitting: Podiatry

## 2021-11-29 DIAGNOSIS — M79675 Pain in left toe(s): Secondary | ICD-10-CM | POA: Diagnosis not present

## 2021-11-29 DIAGNOSIS — B351 Tinea unguium: Secondary | ICD-10-CM

## 2021-11-29 DIAGNOSIS — M79674 Pain in right toe(s): Secondary | ICD-10-CM

## 2021-11-29 NOTE — Progress Notes (Signed)
This patient returns to the office for evaluation and treatment of long thick painful nails .  This patient is unable to trim her own nails since the patient cannot reach her feet.  Patient says the nails are painful walking and wearing her shoes.  She returns for preventive foot care services.  She is accompanied by female caregiver.  General Appearance  Alert, conversant and in no acute stress.  Vascular  Dorsalis pedis and posterior tibial  pulses are   weakly  palpable  bilaterally.  Capillary return is within normal limits  bilaterally. Cold feet. bilaterally.  Neurologic  Senn-Weinstein monofilament wire test within normal limits  bilaterally. Muscle power within normal limits bilaterally.  Nails Thick disfigured discolored nails with subungual debris  from hallux to fifth toes bilaterally. No evidence of bacterial infection or drainage bilaterally.  Orthopedic  No limitations of motion  feet .  No crepitus or effusions noted.  No bony pathology or digital deformities noted.  Skin  normotropic skin with no porokeratosis noted bilaterally.  No signs of infections or ulcers noted.     Onychomycosis  Pain in toes right foot  Pain in toes left foot  Debridement  of nails  1-5  B/L with a nail nipper.  Nails were then filed using a dremel tool with no incidents.  Padding dispensed.  RTC 3 months    Gardiner Barefoot DPM

## 2021-12-06 ENCOUNTER — Other Ambulatory Visit: Payer: Self-pay | Admitting: Family Medicine

## 2021-12-11 ENCOUNTER — Other Ambulatory Visit: Payer: Self-pay | Admitting: Family Medicine

## 2021-12-11 NOTE — Telephone Encounter (Signed)
Last office visit 09/07/21 Last refill 10/18/20, 28 grams, 11 refills

## 2022-01-04 ENCOUNTER — Encounter: Payer: Self-pay | Admitting: *Deleted

## 2022-01-05 ENCOUNTER — Other Ambulatory Visit: Payer: Self-pay | Admitting: Family Medicine

## 2022-01-29 ENCOUNTER — Other Ambulatory Visit: Payer: Self-pay | Admitting: Family Medicine

## 2022-01-29 DIAGNOSIS — J019 Acute sinusitis, unspecified: Secondary | ICD-10-CM

## 2022-02-27 ENCOUNTER — Encounter: Payer: Self-pay | Admitting: Podiatry

## 2022-02-27 ENCOUNTER — Ambulatory Visit: Payer: Medicare Other | Admitting: Podiatry

## 2022-02-27 DIAGNOSIS — M79675 Pain in left toe(s): Secondary | ICD-10-CM | POA: Diagnosis not present

## 2022-02-27 DIAGNOSIS — B351 Tinea unguium: Secondary | ICD-10-CM

## 2022-02-27 DIAGNOSIS — M79674 Pain in right toe(s): Secondary | ICD-10-CM

## 2022-02-27 NOTE — Progress Notes (Signed)
This patient returns to the office for evaluation and treatment of long thick painful nails .  This patient is unable to trim her own nails since the patient cannot reach her feet.  Patient says the nails are painful walking and wearing her shoes.  She returns for preventive foot care services.  She is accompanied by female caregiver. ? ?General Appearance  Alert, conversant and in no acute stress. ? ?Vascular  Dorsalis pedis and posterior tibial  pulses are   weakly  palpable  bilaterally.  Capillary return is within normal limits  bilaterally. Cold feet. bilaterally. ? ?Neurologic  Senn-Weinstein monofilament wire test within normal limits  bilaterally. Muscle power within normal limits bilaterally. ? ?Nails Thick disfigured discolored nails with subungual debris  from hallux to fifth toes bilaterally. No evidence of bacterial infection or drainage bilaterally. ? ?Orthopedic  No limitations of motion  feet .  No crepitus or effusions noted.  No bony pathology or digital deformities noted. ? ?Skin  normotropic skin with no porokeratosis noted bilaterally.  No signs of infections or ulcers noted.    ? ?Onychomycosis  Pain in toes right foot  Pain in toes left foot ? ?Debridement  of nails  1-5  B/L with a nail nipper.  Nails were then filed using a dremel tool with no incidents.  RTC 3 months  ? ? ?Gardiner Barefoot DPM  ?

## 2022-02-28 ENCOUNTER — Telehealth: Payer: Self-pay | Admitting: Family Medicine

## 2022-02-28 DIAGNOSIS — R7303 Prediabetes: Secondary | ICD-10-CM

## 2022-02-28 DIAGNOSIS — E039 Hypothyroidism, unspecified: Secondary | ICD-10-CM

## 2022-02-28 NOTE — Telephone Encounter (Signed)
PT DAUGHTER AWARE LABS PLACED ?

## 2022-03-05 ENCOUNTER — Other Ambulatory Visit: Payer: Medicare Other

## 2022-03-05 DIAGNOSIS — R7303 Prediabetes: Secondary | ICD-10-CM | POA: Diagnosis not present

## 2022-03-05 DIAGNOSIS — E039 Hypothyroidism, unspecified: Secondary | ICD-10-CM | POA: Diagnosis not present

## 2022-03-05 LAB — BAYER DCA HB A1C WAIVED: HB A1C (BAYER DCA - WAIVED): 5.3 % (ref 4.8–5.6)

## 2022-03-06 ENCOUNTER — Ambulatory Visit (INDEPENDENT_AMBULATORY_CARE_PROVIDER_SITE_OTHER): Payer: Medicare Other | Admitting: Family Medicine

## 2022-03-06 ENCOUNTER — Encounter: Payer: Self-pay | Admitting: Family Medicine

## 2022-03-06 VITALS — BP 110/55 | HR 41 | Temp 98.3°F | Ht 60.0 in | Wt 134.6 lb

## 2022-03-06 DIAGNOSIS — Z789 Other specified health status: Secondary | ICD-10-CM | POA: Diagnosis not present

## 2022-03-06 DIAGNOSIS — Z7409 Other reduced mobility: Secondary | ICD-10-CM | POA: Diagnosis not present

## 2022-03-06 DIAGNOSIS — M159 Polyosteoarthritis, unspecified: Secondary | ICD-10-CM

## 2022-03-06 DIAGNOSIS — Z1211 Encounter for screening for malignant neoplasm of colon: Secondary | ICD-10-CM | POA: Diagnosis not present

## 2022-03-06 DIAGNOSIS — G40909 Epilepsy, unspecified, not intractable, without status epilepticus: Secondary | ICD-10-CM

## 2022-03-06 DIAGNOSIS — L720 Epidermal cyst: Secondary | ICD-10-CM | POA: Diagnosis not present

## 2022-03-06 DIAGNOSIS — E038 Other specified hypothyroidism: Secondary | ICD-10-CM | POA: Diagnosis not present

## 2022-03-06 DIAGNOSIS — S51811A Laceration without foreign body of right forearm, initial encounter: Secondary | ICD-10-CM

## 2022-03-06 DIAGNOSIS — I10 Essential (primary) hypertension: Secondary | ICD-10-CM

## 2022-03-06 LAB — LIPID PANEL
Chol/HDL Ratio: 2.4 ratio (ref 0.0–4.4)
Cholesterol, Total: 136 mg/dL (ref 100–199)
HDL: 57 mg/dL (ref >39)
LDL Chol Calc (NIH): 59 mg/dL (ref 0–99)
Triglycerides: 110 mg/dL (ref 0–149)
VLDL Cholesterol Cal: 20 mg/dL (ref 5–40)

## 2022-03-06 LAB — CMP14+EGFR
ALT: 14 IU/L (ref 0–32)
AST: 26 IU/L (ref 0–40)
Albumin/Globulin Ratio: 1.7 (ref 1.2–2.2)
Albumin: 4 g/dL (ref 3.5–4.6)
Alkaline Phosphatase: 88 IU/L (ref 44–121)
BUN/Creatinine Ratio: 15 (ref 12–28)
BUN: 12 mg/dL (ref 10–36)
Bilirubin Total: 0.5 mg/dL (ref 0.0–1.2)
CO2: 30 mmol/L — ABNORMAL HIGH (ref 20–29)
Calcium: 9.5 mg/dL (ref 8.7–10.3)
Chloride: 103 mmol/L (ref 96–106)
Creatinine, Ser: 0.81 mg/dL (ref 0.57–1.00)
Globulin, Total: 2.4 g/dL (ref 1.5–4.5)
Glucose: 98 mg/dL (ref 70–99)
Potassium: 4.6 mmol/L (ref 3.5–5.2)
Sodium: 144 mmol/L (ref 134–144)
Total Protein: 6.4 g/dL (ref 6.0–8.5)
eGFR: 65 mL/min/{1.73_m2} (ref >59)

## 2022-03-06 LAB — T4, FREE: Free T4: 1.31 ng/dL (ref 0.82–1.77)

## 2022-03-06 LAB — TSH: TSH: 2.81 u[IU]/mL (ref 0.450–4.500)

## 2022-03-06 MED ORDER — FUROSEMIDE 20 MG PO TABS: 10.0000 mg | ORAL_TABLET | Freq: Every day | ORAL | 3 refills | Status: DC | PRN

## 2022-03-06 NOTE — Patient Instructions (Signed)
Lasix (furosemide) ONLY if needed for extra fluid/ swelling.  No need to take daily.  Blood pressure is well controlled. ? ?Tetanus is good until 2031. ? ? ?

## 2022-03-06 NOTE — Progress Notes (Signed)
? ?Subjective: ?CC: Chronic follow-up ?PCP: Janora Norlander, DO ?[id number] Samantha Woods is a 88 y.o. female is accompanied today's visit by her daughter, Samantha Woods.  She is presenting to clinic today for: ? ?1.  Skin tear ?Patient reports that she sustained a skin tear on the right forearm after a chair toppled over in her yard.  She seems to be healing pretty well and has been utilizing an over-the-counter antibiotic cream for treatment.  Wanting to know if she needs an updated tetanus. ? ?2.  Decreased hearing ?Patient continues have difficulty with hearing despite use of hearing aid.  She often uses only 1 hearing aid because otherwise sounds sound extremely muffled.  It was recommended to her by the audiologist to actually be reevaluated by her ENT and so she has an appointment scheduled Dr. Constance Holster soon.  Just want to make sure that she does not have any excessive cerumen buildup that may be causing the decreased hearing ? ?3.  Lump on neck ?Patient reports that the cyst lesion on the left side of the neck will be evaluated again by dermatology.  They told her to come back if it became irritated has been bothering her. ? ?4.  ?  Lymph node enlargement ?She reports possible lymph node enlargement along the neck.  Again they have an appointment with Dr. Constance Holster soon to further evaluate.  She does not report any tenderness or recent infection ? ?5.  Seizure disorder ?Patient has not yet scheduled her annual checkup with her neurologist but wanted know if I recommend that she do so.  No recent seizure activity.  Compliant with Keppra.  Sleeps when she is bored at home but is hopeful that she will be much more occupied now that the warm weather is here. ? ?6.  Hypothyroidism ?Compliant with Synthroid.  No reported tremor, heart palpitations or change in bowel habits.  Sometimes she has some urge incontinence and she wears pads at nighttime for this. ? ? ?ROS: Per HPI ? ?Allergies  ?Allergen Reactions  ? Sulfa  Antibiotics Shortness Of Breath  ? Lorazepam Other (See Comments)  ?  Restless, "fidgety"  ? Other   ? Darvon [Propoxyphene] Hives  ? ?Past Medical History:  ?Diagnosis Date  ? GERD (gastroesophageal reflux disease)   ? Hypertension   ? Seizure ([place])   ? Thyroid disease   ? ? ?Current Outpatient Medications:  ?  acetaminophen (TYLENOL) 325 MG tablet, TAKE (2) TABLETS BY MOUTH TWICE DAILY., Disp: 112 tablet, Rfl: 3 ?  ALLERGY RELIEF 10 MG tablet, TAKE (1) TABLET BY MOUTH ONCE DAILY., Disp: 28 tablet, Rfl: 11 ?  amLODipine (NORVASC) 5 MG tablet, TAKE (1) TABLET BY MOUTH ONCE DAILY., Disp: 28 tablet, Rfl: 0 ?  celecoxib (CELEBREX) 100 MG capsule, TAKE 1 CAPSULE BY MOUTH TWICE DAILY., Disp: 56 capsule, Rfl: 3 ?  cetirizine (ZYRTEC) 10 MG tablet, TAKE (1/2) TABLET BY MOUTH ONCE DAILY., Disp: 14 tablet, Rfl: 11 ?  clotrimazole (LOTRIMIN) 1 % cream, APPLY 1 APPLICATION TO THE AFFECTED AREA ON THE GROIN FOR RASH 2 TIMES DAILY FOR 2 WEEKS, Disp: 30 g, Rfl: 3 ?  diclofenac Sodium (VOLTAREN) 1 % GEL, APPLY 2 GRAMS TOPICALLY 4 TIMES DAILY, Disp: 224 g, Rfl: 11 ?  docusate sodium (COLACE) 100 MG capsule, TAKE (1) CAPSULE BY MOUTH TWICE DAILY AS NEEDED FOR MILD CONSTIPATION., Disp: 56 capsule, Rfl: 11 ?  fluticasone (FLONASE) 50 MCG/ACT nasal spray, USE 1 SPRAY IN EACH NOSTRIL DAILY AS NEEDED FOR  SEASONAL ALLERGIES & RHINITIS, Disp: 16 g, Rfl: 5 ?  furosemide (LASIX) 20 MG tablet, TAKE (1/2) TABLET BY MOUTH ONCE DAILY., Disp: 14 tablet, Rfl: 3 ?  levETIRAcetam (KEPPRA) 500 MG tablet, Take 1 tablet (500 mg total) by mouth 2 (two) times daily., Disp: 180 tablet, Rfl: 3 ?  levothyroxine (SYNTHROID) 75 MCG tablet, TAKE (1) TABLET BY MOUTH ONCE DAILY BEFORE BREAKFAST., Disp: 90 tablet, Rfl: 2 ?  MAGNESIUM-OXIDE 400 (240 Mg) MG tablet, TAKE (1/2) TABLET (='200MG'$ ) BY MOUTH TWICE DAILY., Disp: 28 tablet, Rfl: 11 ?  Menthol-Methyl Salicylate (MUSCLE RUB) 10-15 % CREA, Apply 1 application topically 2 (two) times daily as needed for muscle  pain., Disp: , Rfl: 0 ?  Multiple Vitamins-Minerals (MULTIVITAMIN) tablet, TAKE (1) TABLET BY MOUTH ONCE DAILY., Disp: 56 tablet, Rfl: 6 ?  omeprazole (PRILOSEC) 20 MG capsule, TAKE 1 CAPSULE BY MOUTH ONCE A DAY., Disp: 90 capsule, Rfl: 0 ?  potassium chloride SA (KLOR-CON) 20 MEQ tablet, Take 20 mEq by mouth daily., Disp: , Rfl:  ?  Propylene Glycol (SYSTANE COMPLETE) 0.6 % SOLN, Instill 1 drop in each eye  BID, Disp: 15 mL, Rfl: 3 ?  saccharomyces boulardii (FLORASTOR) 250 MG capsule, TAKE 1 CAPSULE BY MOUTH TWICE DAILY., Disp: 56 capsule, Rfl: 3 ?  SYSTANE 0.4-0.3 % SOLN, INSTILL (1) DROP INTO BOTH EYES TWICE DAILY., Disp: 15 mL, Rfl: 4 ?Social History  ? ?Socioeconomic History  ? Marital status: Widowed  ?  Spouse name: Not on file  ? Number of children: Not on file  ? Years of education: Not on file  ? Highest education level: Not on file  ?Occupational History  ? Not on file  ?Tobacco Use  ? Smoking status: Never  ? Smokeless tobacco: Never  ?Vaping Use  ? Vaping Use: Never used  ?Substance and Sexual Activity  ? Alcohol use: Yes  ?  Alcohol/week: 0.0 standard drinks  ?  Comment: occasional  ? Drug use: No  ? Sexual activity: Not on file  ?Other Topics Concern  ? Not on file  ?Social History Narrative  ? Pt lives in a 1 story home with her son and his wife  ? Has 3 adult children  ? High School Graduate  ? Retired from [employer]   ? ?Social Determinants of Health  ? ?Financial Resource Strain: Not on file  ?Food Insecurity: Not on file  ?Transportation Needs: Not on file  ?Physical Activity: Not on file  ?Stress: Not on file  ?Social Connections: Not on file  ?Intimate Partner Violence: Not on file  ? ?Family History  ?Problem Relation Age of Onset  ? Heart disease Mother   ? ? ?Objective: ?Office vital signs reviewed. ?BP (!) 110/55   Pulse (!) 41   Temp 98.3 ?F (36.8 ?C)   Ht 5' (1.524 m)   Wt 134 lb 9.6 oz (61.1 kg)   SpO2 93%   BMI 26.29 kg/m?  ? ?Physical Examination:  ?General: Awake,  alert, well-appearing elderly female, No acute distress ?HEENT: Well-circumscribed cyst noted along the base of the left side of the neck.  No lymphadenopathy appreciated on exam.  I believe that the lesions of concern that patient has observed may in fact be her being able to palpate her carotid arteries.  TMs intact bilaterally but scarring is appreciated.  Scant cerumen in the external auditory canal. ?Cardio: Slightly bradycardic with regular rhythm.  S1S2 heard, no murmurs appreciated ?Pulm: Normal work of breathing on room air.  Some atelectasis appreciated on exam. ?Extremities: warm, well perfused, No edema, cyanosis or clubbing; +2 pulses bilaterally ?MSK: Unsteady gait and station.  Requires assistance for ambulation ?Neuro: Hard of hearing but otherwise neurologically intact ? ?Assessment/ Plan: ?88 y.o. female  ? ?Essential hypertension - Plan: furosemide (LASIX) 20 MG tablet ? ?Colon cancer screening - Plan: Fecal occult blood, imunochemical ? ?Other specified hypothyroidism ? ?Seizure disorder ([place]) ? ?Impaired mobility and ADLs ? ?Primary osteoarthritis involving multiple joints ? ?Dermoid inclusion cyst ? ?Skin tear of right forearm without complication, initial encounter ? ?Blood pressure is normal but I do question some hypotensive episodes at home.  For this reason I have recommended that they discontinue use of Lasix and only use it as needed edema.  There was no evidence of edema on exam.  Her Norvasc has been discontinued since September of last year.  Would like to avoid hypotension in this patient ? ?She desires fecal occult blood test.  No reports of rectal bleeding.  Appetite is good.  No unplanned weight loss ? ?Hypothyroidism well-controlled with current regimen.  No changes ? ?No recent seizure activity.  Continue Keppra.  Follow-up with neurology as scheduled ? ?Advised to avoid situations which she is prone to falling.  Reiterated that she should continue to seek care with her daughter  for guidance on areas that might be potentially dangerous and result in fall.  She is up-to-date on tetanus.  No need to revaccinate at this time.  No evidence of secondary infection on the skin tear.  Continue hom

## 2022-03-08 ENCOUNTER — Ambulatory Visit: Payer: Medicare Other | Admitting: Family Medicine

## 2022-03-09 ENCOUNTER — Ambulatory Visit: Payer: Medicare Other | Admitting: Family Medicine

## 2022-03-29 DIAGNOSIS — L820 Inflamed seborrheic keratosis: Secondary | ICD-10-CM | POA: Diagnosis not present

## 2022-03-29 DIAGNOSIS — Z1283 Encounter for screening for malignant neoplasm of skin: Secondary | ICD-10-CM | POA: Diagnosis not present

## 2022-03-29 DIAGNOSIS — D225 Melanocytic nevi of trunk: Secondary | ICD-10-CM | POA: Diagnosis not present

## 2022-03-29 DIAGNOSIS — L720 Epidermal cyst: Secondary | ICD-10-CM | POA: Diagnosis not present

## 2022-03-30 ENCOUNTER — Other Ambulatory Visit: Payer: Self-pay | Admitting: Family Medicine

## 2022-04-09 ENCOUNTER — Other Ambulatory Visit: Payer: Medicare Other

## 2022-04-09 DIAGNOSIS — Z1211 Encounter for screening for malignant neoplasm of colon: Secondary | ICD-10-CM

## 2022-04-10 LAB — FECAL OCCULT BLOOD, IMMUNOCHEMICAL: Fecal Occult Bld: NEGATIVE

## 2022-04-17 DIAGNOSIS — Z974 Presence of external hearing-aid: Secondary | ICD-10-CM | POA: Diagnosis not present

## 2022-04-17 DIAGNOSIS — H6123 Impacted cerumen, bilateral: Secondary | ICD-10-CM | POA: Diagnosis not present

## 2022-04-17 DIAGNOSIS — H93293 Other abnormal auditory perceptions, bilateral: Secondary | ICD-10-CM | POA: Diagnosis not present

## 2022-04-30 ENCOUNTER — Other Ambulatory Visit: Payer: Self-pay | Admitting: Family Medicine

## 2022-04-30 DIAGNOSIS — I10 Essential (primary) hypertension: Secondary | ICD-10-CM

## 2022-05-16 ENCOUNTER — Other Ambulatory Visit: Payer: Self-pay | Admitting: Neurology

## 2022-05-22 ENCOUNTER — Other Ambulatory Visit: Payer: Self-pay | Admitting: Family Medicine

## 2022-06-22 ENCOUNTER — Other Ambulatory Visit: Payer: Self-pay | Admitting: Neurology

## 2022-06-29 ENCOUNTER — Ambulatory Visit: Payer: Medicare Other | Admitting: Podiatry

## 2022-06-29 ENCOUNTER — Encounter: Payer: Self-pay | Admitting: Podiatry

## 2022-06-29 DIAGNOSIS — B351 Tinea unguium: Secondary | ICD-10-CM

## 2022-06-29 DIAGNOSIS — M79674 Pain in right toe(s): Secondary | ICD-10-CM

## 2022-06-29 DIAGNOSIS — M79675 Pain in left toe(s): Secondary | ICD-10-CM | POA: Diagnosis not present

## 2022-06-29 NOTE — Progress Notes (Signed)
This patient returns to the office for evaluation and treatment of long thick painful nails .  This patient is unable to trim her own nails since the patient cannot reach her feet.  Patient says the nails are painful walking and wearing her shoes.  She returns for preventive foot care services.  She is accompanied by female caregiver.  General Appearance  Alert, conversant and in no acute stress.  Vascular  Dorsalis pedis and posterior tibial  pulses are   weakly  palpable  bilaterally.  Capillary return is within normal limits  bilaterally. Cold feet. bilaterally.  Neurologic  Senn-Weinstein monofilament wire test within normal limits  bilaterally. Muscle power within normal limits bilaterally.  Nails Thick disfigured discolored nails with subungual debris  from hallux to fifth toes bilaterally. No evidence of bacterial infection or drainage bilaterally.  Orthopedic  No limitations of motion  feet .  No crepitus or effusions noted.  No bony pathology or digital deformities noted.  Skin  normotropic skin with no porokeratosis noted bilaterally.  No signs of infections or ulcers noted.     Onychomycosis  Pain in toes right foot  Pain in toes left foot  Debridement  of nails  1-5  B/L with a nail nipper.  Nails were then filed using a dremel tool with no incidents.  RTC 4  months    Calianne Larue DPM  

## 2022-07-03 ENCOUNTER — Encounter: Payer: Self-pay | Admitting: Neurology

## 2022-07-03 ENCOUNTER — Ambulatory Visit (INDEPENDENT_AMBULATORY_CARE_PROVIDER_SITE_OTHER): Payer: Medicare Other | Admitting: Neurology

## 2022-07-03 VITALS — BP 138/76 | HR 73 | Ht 59.0 in | Wt 135.0 lb

## 2022-07-03 DIAGNOSIS — G40009 Localization-related (focal) (partial) idiopathic epilepsy and epileptic syndromes with seizures of localized onset, not intractable, without status epilepticus: Secondary | ICD-10-CM

## 2022-07-03 MED ORDER — LEVETIRACETAM 500 MG PO TABS: 500.0000 mg | ORAL_TABLET | Freq: Two times a day (BID) | ORAL | 3 refills | Status: DC

## 2022-07-03 NOTE — Patient Instructions (Signed)
Always a pleasure to see you! Continue Keppra (Levetiracetam) '500mg'$  twice a day. Follow-up in 1 year, call for any changes.   Seizure Precautions: 1. If medication has been prescribed for you to prevent seizures, take it exactly as directed.  Do not stop taking the medicine without talking to your doctor first, even if you have not had a seizure in a long time.   2. Avoid activities in which a seizure would cause danger to yourself or to others.  Don't operate dangerous machinery, swim alone, or climb in high or dangerous places, such as on ladders, roofs, or girders.  Do not drive unless your doctor says you may.  3. If you have any warning that you may have a seizure, lay down in a safe place where you can't hurt yourself.    4.  No driving for 6 months from last seizure, as per [facility].   Please refer to the following link on the [place] website for more information: http://[email or web address]/[email or web address]/drivingu.cfm   5.  Maintain good sleep hygiene.  6.  Contact your doctor if you have any problems that may be related to the medicine you are taking.  7.  Call 911 and bring the patient back to the ED if:        A.  The seizure lasts longer than 5 minutes.       B.  The patient doesn't awaken shortly after the seizure  C.  The patient has new problems such as difficulty seeing, speaking or moving  D.  The patient was injured during the seizure  E.  The patient has a temperature over 102 F (39C)  F.  The patient vomited and now is having trouble breathing

## 2022-07-03 NOTE — Progress Notes (Signed)
NEUROLOGY FOLLOW UP OFFICE NOTE  Samantha Woods [id number] 30-Jun-1921  HISTORY OF PRESENT ILLNESS: I had the pleasure of seeing Samantha Woods in follow-up in the neurology clinic on 07/03/2022.  The patient was last seen a year ago for seizures. She is again accompanied by her daughter who helps supplement the history today.  Records and images were personally reviewed where available.  She had 2 seizures in October 2018 with unremarkable workup. She reported lethargy on Levetiracetam '500mg'$  BID, however after reduction in dose, she had another seizure in 2019. None since increasing back to '500mg'$  BID. She continues to do well, family has not noticed any staring/unresponsive episodes. She denies any olfactory/gustatory hallucinations, focal numbness/tingling/weakness, myoclonic jerks. She has arthritis and feels stiff sometimes in her arms and legs. No falls. She ambulates with either a cane or walker. She denies any headaches, dizziness, no falls. She lives with her children, alternating staying between [place] and [place]. [name] helps with medications. No significant behavioral issues, [name] reports she is "bossy" sometimes.    History on Initial Assessment 10/30/2017: This is a very pleasant 88 yo RH woman with a history of hypertension, hypothyroidism, with new onset seizure last 09/18/2017. She recalls she was crocheting, bent down to get something, then had no recollection of subsequent events until she was in the hospital. On further questioning, she does not remember much of her hospital stay. Her son reports she does not recall much of being in a SNF afterwards either. Her son came home that day and found her on the floor. She was not talking for more than a day. [facility] records reviewed, she was lethargic, confused, non-verbal. CT perfusion did not show any evidence of stroke. She was started on empiric antibiotics and lumbar puncture was planned. Prior to LP being performed, she had a seizure and was  given 1.'5mg'$  Ativan. She had an MRI brain which I personally reviewed, no acute changes seen. EEG showed diffuse slowing, no epileptiform discharges seen. Her LP was overall unremarkable except for an elevated protein of 62. Urine culture positive for Klebsiella. It was felt that seizure was secondary to UTI, but at age 58, seizure predisposition is likely, and further seizures could occur with future infections, hence she was discharged on Keppra '500mg'$  BID.    There have been no further similar spells since October. No prior similar episodes in the past. She has been living with her son for the past 3 years, and he reports that prior to October, she had been doing pretty well, in charge of her own medications, pulling weeds in the yard. There has been a big change since return home, her son now puts her pills in a pillbox and sets a timer for her, which she does fine with. She cannot remember things for any length of time. She is still not able to walk very far, she now has to use a walker at all times (previously only used prn cane). She did break her collarbone when she fell, but denies any pain. She feels her left side is weaker. She reports that this is chronic, she has always had to lift her left leg to get into a car. She has pain in her left knee and had an injection before her hospitalization. Her son reports she is not walking like she normally did.   She has occasional sharp pains behind the eyes. She has some dizziness upon standing. There is occasional numbness in her fingertips and both feet. She used to  crochet a lot, but not like before per son. He has to help her into the shower but she can bathe herself. She needs help lifting her left leg in and out of the shower. She is able to dress independently. She has some pain under the right side of her chest when combing her hair. She stopped driving at age 31. Her son is in charge of finances. She has urinary frequency, her son has been waking her up  at 3am to use the bathroom, otherwise she would wet the bed. Since hospital discharge, she has been sleeping "all the time." Her son denies any staring/unresponsive episodes. She denies any olfactory/gustatory hallucinations, deja vu, rising epigastric sensation, myoclonic jerks.   Epilepsy Risk Factors:  Her youngest sister had a seizure while pregnant. Otherwise she had a normal birth and early development.  There is no history of febrile convulsions, CNS infections such as meningitis/encephalitis, significant traumatic brain injury, neurosurgical procedures.  PAST MEDICAL HISTORY: Past Medical History:  Diagnosis Date   GERD (gastroesophageal reflux disease)    Hypertension    Seizure ([place])    Thyroid disease     MEDICATIONS: Current Outpatient Medications on File Prior to Visit  Medication Sig Dispense Refill   acetaminophen (TYLENOL) 325 MG tablet TAKE 2 TABLETS BY MOUTH TWICE DAILY. 112 tablet 11   ALLERGY RELIEF 10 MG tablet TAKE (1) TABLET BY MOUTH ONCE DAILY. 28 tablet 11   celecoxib (CELEBREX) 100 MG capsule TAKE 1 CAPSULE BY MOUTH TWICE DAILY. 56 capsule 11   clotrimazole (LOTRIMIN) 1 % cream APPLY 1 APPLICATION TO THE AFFECTED AREA ON THE GROIN FOR RASH 2 TIMES DAILY FOR 2 WEEKS 30 g 3   diclofenac Sodium (VOLTAREN) 1 % GEL APPLY 2 GRAMS TOPICALLY 4 TIMES DAILY 224 g 11   docusate sodium (COLACE) 100 MG capsule TAKE (1) CAPSULE BY MOUTH TWICE DAILY AS NEEDED FOR MILD CONSTIPATION. 56 capsule 11   FLORASTOR 250 MG capsule TAKE 1 CAPSULE BY MOUTH TWICE DAILY. 56 capsule 11   fluticasone (FLONASE) 50 MCG/ACT nasal spray USE 1 SPRAY IN EACH NOSTRIL DAILY AS NEEDED FOR SEASONAL ALLERGIES & RHINITIS 16 g 5   furosemide (LASIX) 20 MG tablet TAKE (1/2) TABLET BY MOUTH ONCE DAILY. 14 tablet 11   levETIRAcetam (KEPPRA) 500 MG tablet TAKE 1 TABLET BY MOUTH TWICE DAILY. 60 tablet 0   levothyroxine (SYNTHROID) 75 MCG tablet TAKE (1) TABLET BY MOUTH ONCE DAILY BEFORE BREAKFAST. 90 tablet 2    Menthol-Methyl Salicylate (MUSCLE RUB) 10-15 % CREA Apply 1 application topically 2 (two) times daily as needed for muscle pain.  0   Multiple Vitamins-Minerals (MULTIVITAMIN) tablet TAKE (1) TABLET BY MOUTH ONCE DAILY. 56 tablet 6   omeprazole (PRILOSEC) 20 MG capsule TAKE 1 CAPSULE BY MOUTH ONCE A DAY. 28 capsule 11   potassium chloride SA (KLOR-CON) 20 MEQ tablet Take 20 mEq by mouth daily.     Propylene Glycol (SYSTANE COMPLETE) 0.6 % SOLN Instill 1 drop in each eye  BID 15 mL 3   cetirizine (ZYRTEC) 10 MG tablet TAKE (1/2) TABLET BY MOUTH ONCE DAILY. 14 tablet 11   MAGNESIUM-OXIDE 400 (240 Mg) MG tablet TAKE (1/2) TABLET (='200MG'$ ) BY MOUTH TWICE DAILY. (Patient not taking: Reported on 07/03/2022) 28 tablet 11   SYSTANE 0.4-0.3 % SOLN INSTILL (1) DROP INTO BOTH EYES TWICE DAILY. (Patient not taking: Reported on 07/03/2022) 15 mL 4   No current facility-administered medications on file prior to visit.  ALLERGIES: Allergies  Allergen Reactions   Sulfa Antibiotics Shortness Of Breath   Lorazepam Other (See Comments)    Restless, "fidgety"   Other    Darvon [Propoxyphene] Hives    FAMILY HISTORY: Family History  Problem Relation Age of Onset   Heart disease Mother     SOCIAL HISTORY: Social History   Socioeconomic History   Marital status: Widowed    Spouse name: Not on file   Number of children: Not on file   Years of education: Not on file   Highest education level: Not on file  Occupational History   Not on file  Tobacco Use   Smoking status: Never   Smokeless tobacco: Never  Vaping Use   Vaping Use: Never used  Substance and Sexual Activity   Alcohol use: Yes    Alcohol/week: 0.0 standard drinks of alcohol    Comment: occasional   Drug use: No   Sexual activity: Not on file  Other Topics Concern   Not on file  Social History Narrative   Pt lives in a 1 story home with her son and his wife one level   Has 3 adult children   Programmer, systems   Retired from  [employer]    Caffeine 4 cups daily   Social Determinants of Health   Financial Resource Strain: Not on file  Food Insecurity: Not on file  Transportation Needs: Not on file  Physical Activity: Not on file  Stress: Not on file  Social Connections: Not on file  Intimate Partner Violence: Not on file     PHYSICAL EXAM: Vitals:   07/03/22 1420  BP: 138/76  Pulse: 73  SpO2: 96%   General: No acute distress Head:  Normocephalic/atraumatic Skin/Extremities: No rash, no edema Neurological Exam: alert and awake. No aphasia or dysarthria. Fund of knowledge is appropriate.  Attention and concentration are normal.   Cranial nerves: Pupils equal, round. Extraocular movements intact with no nystagmus. Visual fields full.  No facial asymmetry.  Motor: Bulk and tone normal, muscle strength 5/5 throughout with no pronator drift.   Finger to nose testing intact.  Gait slow and cautious with cane, right leg slightly more bent. No ataxia.   IMPRESSION: This is a pleasant 88 yo RH woman with a history of hypertension, hypothyroidism, who had a seizure in 08/2017 in the setting of a UTI. MRI brain no acute changes, EEG showed diffuse slowing. She was started on Levetiracetam but had side effects initially on '500mg'$  BID, however with dose reduction, she had another seizure in 2019. She has been doing well back on Levetiracetam '500mg'$  BID with no seizures since 2019, no side effects. Refills sent. She does not drive. Continue close supervision. Follow-up in 1 year, call for any changes.   Thank you for allowing me to participate in her care.  Please do not hesitate to call for any questions or concerns.    Ellouise Newer, M.D.   CC: Dr. Lajuana Ripple

## 2022-07-18 ENCOUNTER — Other Ambulatory Visit: Payer: Self-pay | Admitting: Family Medicine

## 2022-07-20 ENCOUNTER — Telehealth: Payer: Self-pay | Admitting: Family Medicine

## 2022-07-20 MED ORDER — LEVOTHYROXINE SODIUM 75 MCG PO TABS: ORAL_TABLET | ORAL | 0 refills | Status: DC

## 2022-07-20 MED ORDER — THERA VITAL M PO TABS: ORAL_TABLET | ORAL | 0 refills | Status: DC

## 2022-07-20 NOTE — Telephone Encounter (Signed)
Sent refills

## 2022-07-20 NOTE — Telephone Encounter (Signed)
Jessica called [facility]. Request med refill for  levothyroxine (SYNTHROID) 75 MCG tablet Multiple Vitamins-Minerals (MULTIVITAMIN) tablet  Pt last ov 03/06/2022 Upcoming apt 09/05/2022

## 2022-08-16 ENCOUNTER — Other Ambulatory Visit: Payer: Self-pay | Admitting: Family Medicine

## 2022-08-17 ENCOUNTER — Telehealth: Payer: Self-pay | Admitting: Family Medicine

## 2022-08-17 ENCOUNTER — Other Ambulatory Visit: Payer: Self-pay | Admitting: Family Medicine

## 2022-08-17 MED ORDER — THERA VITAL M PO TABS: ORAL_TABLET | ORAL | 0 refills | Status: DC

## 2022-08-17 MED ORDER — LEVOTHYROXINE SODIUM 75 MCG PO TABS: ORAL_TABLET | ORAL | 0 refills | Status: DC

## 2022-08-17 NOTE — Telephone Encounter (Signed)
  Prescription Request  08/17/2022  Is this a "Controlled Substance" medicine? no  Have you seen your PCP in the last 2 weeks? No   If YES, route message to pool  -  If NO, patient needs to be scheduled for appointment.  What is the name of the medication or equipment? Multiple Vitamins-Minerals (MULTIVITAMIN) tablet and levothyroxine (SYNTHROID) 75 MCG tablet  Have you contacted your pharmacy to request a refill? Yes    Which pharmacy would you like this sent to? [facility]    Patient notified that their request is being sent to the clinical staff for review and that they should receive a response within 2 business days.

## 2022-08-17 NOTE — Telephone Encounter (Signed)
Rx sent  NA

## 2022-09-05 ENCOUNTER — Ambulatory Visit (INDEPENDENT_AMBULATORY_CARE_PROVIDER_SITE_OTHER): Payer: Medicare Other | Admitting: Family Medicine

## 2022-09-05 ENCOUNTER — Encounter: Payer: Self-pay | Admitting: Family Medicine

## 2022-09-05 VITALS — BP 123/71 | HR 63 | Temp 98.5°F | Ht 59.0 in | Wt 135.4 lb

## 2022-09-05 DIAGNOSIS — E039 Hypothyroidism, unspecified: Secondary | ICD-10-CM | POA: Diagnosis not present

## 2022-09-05 DIAGNOSIS — I10 Essential (primary) hypertension: Secondary | ICD-10-CM

## 2022-09-05 DIAGNOSIS — Z23 Encounter for immunization: Secondary | ICD-10-CM

## 2022-09-05 DIAGNOSIS — H6120 Impacted cerumen, unspecified ear: Secondary | ICD-10-CM | POA: Diagnosis not present

## 2022-09-05 DIAGNOSIS — M159 Polyosteoarthritis, unspecified: Secondary | ICD-10-CM

## 2022-09-05 MED ORDER — LEVOTHYROXINE SODIUM 75 MCG PO TABS: ORAL_TABLET | ORAL | 11 refills | Status: DC

## 2022-09-05 MED ORDER — DOCUSATE SODIUM 100 MG PO CAPS: ORAL_CAPSULE | ORAL | 12 refills | Status: DC

## 2022-09-05 MED ORDER — CETIRIZINE HCL 10 MG PO TABS: ORAL_TABLET | ORAL | 11 refills | Status: DC

## 2022-09-05 NOTE — Progress Notes (Signed)
Subjective: CC: Hypothyroidism, hypertension PCP: Janora Norlander, DO HDQ:QIWLN Samantha Woods is a 88 y.o. female presenting to clinic today for:  1.  Hypothyroidism Patient is compliant with Synthroid 75 mcg daily.  Has low energy but notes that she will be 88 years old in January.  No reports of changes in bowel habits  2.  Hearing difficulties Patient thinks that she may have some cerumen buildup.  She wears hearing aids regularly  3.  Arthritis Patient has Celebrex on hand if needed for twice daily use for arthritis but she uses some roller gel on the ankles and this does seem to help.   ROS: Per HPI  Allergies  Allergen Reactions   Sulfa Antibiotics Shortness Of Breath   Lorazepam Other (See Comments)    Restless, "fidgety"   Other    Darvon [Propoxyphene] Hives   Past Medical History:  Diagnosis Date   GERD (gastroesophageal reflux disease)    Hypertension    Seizure (HCC)    Thyroid disease     Current Outpatient Medications:    acetaminophen (TYLENOL) 325 MG tablet, TAKE 2 TABLETS BY MOUTH TWICE DAILY., Disp: 112 tablet, Rfl: 11   ALLERGY RELIEF 10 MG tablet, TAKE (1) TABLET BY MOUTH ONCE DAILY., Disp: 30 tablet, Rfl: 1   celecoxib (CELEBREX) 100 MG capsule, TAKE 1 CAPSULE BY MOUTH TWICE DAILY., Disp: 56 capsule, Rfl: 11   cetirizine (ZYRTEC) 10 MG tablet, TAKE (1/2) TABLET BY MOUTH ONCE DAILY., Disp: 14 tablet, Rfl: 11   clotrimazole (LOTRIMIN) 1 % cream, APPLY 1 APPLICATION TO THE AFFECTED AREA ON THE GROIN FOR RASH 2 TIMES DAILY FOR 2 WEEKS, Disp: 30 g, Rfl: 3   diclofenac Sodium (VOLTAREN) 1 % GEL, APPLY 2 GRAMS TOPICALLY 4 TIMES DAILY, Disp: 200 g, Rfl: 0   docusate sodium (COLACE) 100 MG capsule, TAKE (1) CAPSULE BY MOUTH TWICE DAILY AS NEEDED FOR MILD CONSTIPATION., Disp: 60 capsule, Rfl: 1   FLORASTOR 250 MG capsule, TAKE 1 CAPSULE BY MOUTH TWICE DAILY., Disp: 56 capsule, Rfl: 11   fluticasone (FLONASE) 50 MCG/ACT nasal spray, USE 1 SPRAY IN EACH NOSTRIL  DAILY AS NEEDED FOR SEASONAL ALLERGIES & RHINITIS, Disp: 16 g, Rfl: 5   furosemide (LASIX) 20 MG tablet, TAKE (1/2) TABLET BY MOUTH ONCE DAILY., Disp: 14 tablet, Rfl: 11   levETIRAcetam (KEPPRA) 500 MG tablet, Take 1 tablet (500 mg total) by mouth 2 (two) times daily., Disp: 180 tablet, Rfl: 3   levothyroxine (SYNTHROID) 75 MCG tablet, TAKE (1) TABLET BY MOUTH ONCE DAILY BEFORE BREAKFAST., Disp: 30 tablet, Rfl: 0   magnesium oxide (MAG-OX) 400 (240 Mg) MG tablet, TAKE (1/2) TABLET (='200MG'$ ) BY MOUTH TWICE DAILY., Disp: 30 tablet, Rfl: 1   Menthol-Methyl Salicylate (MUSCLE RUB) 10-15 % CREA, Apply 1 application topically 2 (two) times daily as needed for muscle pain., Disp: , Rfl: 0   Multiple Vitamins-Minerals (MULTIVITAMIN) tablet, TAKE (1) TABLET BY MOUTH ONCE DAILY., Disp: 30 tablet, Rfl: 0   omeprazole (PRILOSEC) 20 MG capsule, TAKE 1 CAPSULE BY MOUTH ONCE A DAY., Disp: 28 capsule, Rfl: 11   potassium chloride SA (KLOR-CON) 20 MEQ tablet, Take 20 mEq by mouth daily., Disp: , Rfl:    Propylene Glycol (SYSTANE COMPLETE) 0.6 % SOLN, Instill 1 drop in each eye  BID, Disp: 15 mL, Rfl: 3   SYSTANE 0.4-0.3 % SOLN, INSTILL (1) DROP INTO BOTH EYES TWICE DAILY., Disp: 15 mL, Rfl: 4 Social History   Socioeconomic History   Marital status:  Widowed    Spouse name: Not on file   Number of children: Not on file   Years of education: Not on file   Highest education level: Not on file  Occupational History   Not on file  Tobacco Use   Smoking status: Never   Smokeless tobacco: Never  Vaping Use   Vaping Use: Never used  Substance and Sexual Activity   Alcohol use: Yes    Alcohol/week: 0.0 standard drinks of alcohol    Comment: occasional   Drug use: No   Sexual activity: Not on file  Other Topics Concern   Not on file  Social History Narrative   Pt lives in a 1 story home with her son and his wife one level   Has 3 adult children   Programmer, systems   Retired from [employer]     Caffeine 4 cups daily   Social Determinants of Health   Financial Resource Strain: Not on file  Food Insecurity: Not on file  Transportation Needs: Not on file  Physical Activity: Not on file  Stress: Not on file  Social Connections: Not on file  Intimate Partner Violence: Not on file   Family History  Problem Relation Age of Onset   Heart disease Mother     Objective: Office vital signs reviewed. BP 123/71   Pulse 63   Temp 98.5 F (36.9 C)   Ht '4\' 11"'$  (1.499 m)   Wt 135 lb 6.4 oz (61.4 kg)   SpO2 97%   BMI 27.35 kg/m   Physical Examination:  General: Awake, alert, nontoxic elderly female in no acute distress, No acute distress HEENT: Left TM visible but she has mild to moderate amounts of cerumen in external auditory canal. Cardio: regular rate and rhythm, S1S2 heard, no murmurs appreciated Pulm: clear to auscultation bilaterally, no wheezes, rhonchi or rales; normal work of breathing on room air MSK: Requires assistance for ambulation.  Scoliotic curve of the spine appreciated.  Thoracic kyphosis present Neuro: Hard of hearing.  Wears hearing aids  Assessment/ Plan: 88 y.o. female   Hypothyroidism, unspecified type - Plan: T4, free, TSH  Need for immunization against influenza - Plan: Flu Vaccine QUAD High Dose(Fluad)  Cerumen in auditory canal on examination  Essential hypertension  Primary osteoarthritis involving multiple joints  Check thyroid levels.  Not for any other labs at this time.  Flu shot given  Cerumen flushed with irrigation on the left  Blood pressure is controlled  Okay to continue topicals for osteoarthritis.  She will let me know if this progresses  Orders Placed This Encounter  Procedures   Flu Vaccine QUAD High Dose(Fluad)   T4, free   TSH   No orders of the defined types were placed in this encounter.    Janora Norlander, DO [place] [phone]

## 2022-09-06 LAB — TSH: TSH: 3.47 u[IU]/mL (ref 0.450–4.500)

## 2022-09-06 LAB — T4, FREE: Free T4: 1.2 ng/dL (ref 0.82–1.77)

## 2022-09-14 ENCOUNTER — Other Ambulatory Visit: Payer: Self-pay | Admitting: Family Medicine

## 2022-10-06 ENCOUNTER — Other Ambulatory Visit: Payer: Self-pay

## 2022-10-06 ENCOUNTER — Emergency Department (HOSPITAL_COMMUNITY)
Admission: EM | Admit: 2022-10-06 | Discharge: 2022-10-06 | Disposition: A | Discharge status: Home / Self Care | Payer: Medicare Other | Attending: Emergency Medicine | Admitting: Emergency Medicine

## 2022-10-06 ENCOUNTER — Emergency Department (HOSPITAL_COMMUNITY): Payer: Medicare Other

## 2022-10-06 ENCOUNTER — Encounter (HOSPITAL_COMMUNITY): Payer: Self-pay | Admitting: Emergency Medicine

## 2022-10-06 DIAGNOSIS — I10 Essential (primary) hypertension: Secondary | ICD-10-CM | POA: Diagnosis not present

## 2022-10-06 DIAGNOSIS — K449 Diaphragmatic hernia without obstruction or gangrene: Secondary | ICD-10-CM | POA: Diagnosis not present

## 2022-10-06 DIAGNOSIS — R1032 Left lower quadrant pain: Secondary | ICD-10-CM | POA: Insufficient documentation

## 2022-10-06 DIAGNOSIS — M47816 Spondylosis without myelopathy or radiculopathy, lumbar region: Secondary | ICD-10-CM | POA: Diagnosis not present

## 2022-10-06 DIAGNOSIS — R109 Unspecified abdominal pain: Secondary | ICD-10-CM | POA: Diagnosis not present

## 2022-10-06 DIAGNOSIS — R110 Nausea: Secondary | ICD-10-CM | POA: Diagnosis not present

## 2022-10-06 LAB — CBC WITH DIFFERENTIAL/PLATELET
Abs Immature Granulocytes: 0.02 10*3/uL (ref 0.00–0.07)
Basophils Absolute: 0.1 10*3/uL (ref 0.0–0.1)
Basophils Relative: 1 %
Eosinophils Absolute: 0.4 10*3/uL (ref 0.0–0.5)
Eosinophils Relative: 6 %
HCT: 42.6 % (ref 36.0–46.0)
Hemoglobin: 13.9 g/dL (ref 12.0–15.0)
Immature Granulocytes: 0 %
Lymphocytes Relative: 23 %
Lymphs Abs: 1.4 10*3/uL (ref 0.7–4.0)
MCH: 31.7 pg (ref 26.0–34.0)
MCHC: 32.6 g/dL (ref 30.0–36.0)
MCV: 97.3 fL (ref 80.0–100.0)
Monocytes Absolute: 0.7 10*3/uL (ref 0.1–1.0)
Monocytes Relative: 11 %
Neutro Abs: 3.7 10*3/uL (ref 1.7–7.7)
Neutrophils Relative %: 59 %
Platelets: 252 10*3/uL (ref 150–400)
RBC: 4.38 MIL/uL (ref 3.87–5.11)
RDW: 14 % (ref 11.5–15.5)
WBC: 6.2 10*3/uL (ref 4.0–10.5)
nRBC: 0 % (ref 0.0–0.2)

## 2022-10-06 LAB — COMPREHENSIVE METABOLIC PANEL
ALT: 23 U/L (ref 0–44)
AST: 33 U/L (ref 15–41)
Albumin: 3.5 g/dL (ref 3.5–5.0)
Alkaline Phosphatase: 87 U/L (ref 38–126)
Anion gap: 6 (ref 5–15)
BUN: 15 mg/dL (ref 8–23)
CO2: 30 mmol/L (ref 22–32)
Calcium: 9 mg/dL (ref 8.9–10.3)
Chloride: 105 mmol/L (ref 98–111)
Creatinine, Ser: 0.74 mg/dL (ref 0.44–1.00)
GFR, Estimated: >60 mL/min (ref >60)
Glucose, Bld: 129 mg/dL — ABNORMAL HIGH (ref 70–99)
Potassium: 3.8 mmol/L (ref 3.5–5.1)
Sodium: 141 mmol/L (ref 135–145)
Total Bilirubin: 0.7 mg/dL (ref 0.3–1.2)
Total Protein: 6.7 g/dL (ref 6.5–8.1)

## 2022-10-06 LAB — URINALYSIS, ROUTINE W REFLEX MICROSCOPIC
Bilirubin Urine: NEGATIVE
Glucose, UA: NEGATIVE mg/dL
Hgb urine dipstick: NEGATIVE
Ketones, ur: NEGATIVE mg/dL
Nitrite: NEGATIVE
Protein, ur: NEGATIVE mg/dL
Specific Gravity, Urine: >1.046 — ABNORMAL HIGH (ref 1.005–1.030)
pH: 6 (ref 5.0–8.0)

## 2022-10-06 LAB — LIPASE, BLOOD: Lipase: 30 U/L (ref 11–51)

## 2022-10-06 LAB — LACTIC ACID, PLASMA: Lactic Acid, Venous: 1.5 mmol/L (ref 0.5–1.9)

## 2022-10-06 MED ORDER — IOHEXOL 350 MG/ML SOLN
100.0000 mL | Freq: Once | INTRAVENOUS | Status: AC | PRN
  Administered 2022-10-06: 100 mL via INTRAVENOUS

## 2022-10-06 MED ORDER — KETOROLAC TROMETHAMINE 30 MG/ML IJ SOLN
30.0000 mg | Freq: Once | INTRAMUSCULAR | Status: AC
  Administered 2022-10-06: 30 mg via INTRAVENOUS
  Filled 2022-10-06: qty 1

## 2022-10-06 MED ORDER — DICYCLOMINE HCL 20 MG PO TABS: 20.0000 mg | ORAL_TABLET | Freq: Two times a day (BID) | ORAL | 0 refills | Status: DC | PRN

## 2022-10-06 MED ORDER — ONDANSETRON HCL 4 MG/2ML IJ SOLN
4.0000 mg | Freq: Once | INTRAMUSCULAR | Status: AC
  Administered 2022-10-06: 4 mg via INTRAVENOUS
  Filled 2022-10-06: qty 2

## 2022-10-06 NOTE — ED Notes (Signed)
Patient transported to CT 

## 2022-10-06 NOTE — Discharge Instructions (Addendum)
Please schedule follow-up appoint with your primary care doctor.  You can stick to a lighter diet for 2 days, use heating packs as needed for abdominal pain.

## 2022-10-06 NOTE — ED Triage Notes (Signed)
Pt reports left flank/abdominal pain that started last night. Denies urinary symptoms and fevers. Pt reports she had BM last night with no improvement.

## 2022-10-06 NOTE — ED Provider Notes (Signed)
[facility] EMERGENCY DEPARTMENT Provider Note   CSN: [id number] Arrival date & time: 10/06/22  1151     History  Chief Complaint  Patient presents with   Abdominal Pain    Samantha Woods is a 88 y.o. female presented ED of abdominal pain, left lower quadrant, ongoing for 2 days.  Reports she typically gets this pain and is relieved with bowel movements but she had a bowel movement yesterday and the pain is not relieved.  She does have regular bowel movements.  She is accompanied by her daughter at the bedside.  The patient lives independently and has been functioning well; typically has a normal appetite.  She has a history of a cholecystectomy and possible hysterectomy but no other abdominal surgeries.  HPI     Home Medications Prior to Admission medications   Medication Sig Start Date End Date Taking? Authorizing Provider  acetaminophen (TYLENOL) 325 MG tablet TAKE 2 TABLETS BY MOUTH TWICE DAILY. Patient taking differently: Take 650 mg by mouth in the morning and at bedtime. 03/30/22  Yes Gottschalk, Ashly M, DO  ALLERGY RELIEF 10 MG tablet Take 10 mg by mouth daily. 10/05/22  Yes [provider]  celecoxib (CELEBREX) 100 MG capsule TAKE 1 CAPSULE BY MOUTH TWICE DAILY. Patient taking differently: Take 100 mg by mouth 2 (two) times daily. 05/01/22  Yes Gottschalk, Ashly M, DO  cetirizine (ZYRTEC) 10 MG tablet TAKE (1/2) TABLET BY MOUTH ONCE DAILY. Patient taking differently: Take 5 mg by mouth daily. TAKE (1/2) TABLET BY MOUTH ONCE DAILY. 09/05/22  Yes Gottschalk, Ashly M, DO  clotrimazole (LOTRIMIN) 1 % cream APPLY 1 APPLICATION TO THE AFFECTED AREA ON THE GROIN FOR RASH 2 TIMES DAILY FOR 2 WEEKS Patient taking differently: Apply 1 Application topically 2 (two) times daily as needed (rash). 12/11/21  Yes Gottschalk, Leatrice Jewels M, DO  diclofenac Sodium (VOLTAREN) 1 % GEL APPLY 2 GRAMS TOPICALLY 4 TIMES DAILY Patient taking differently: Apply 2 g topically 4 (four) times daily as  needed (muscle pain). 08/16/22  Yes Dettinger, Fransisca Kaufmann, MD  dicyclomine (BENTYL) 20 MG tablet Take 1 tablet (20 mg total) by mouth 2 (two) times daily as needed for up to 14 doses for spasms. Abdominal cramping 10/06/22  Yes Dwanda Tufano, Carola Rhine, MD  docusate sodium (COLACE) 100 MG capsule TAKE (1) CAPSULE BY MOUTH TWICE DAILY AS NEEDED FOR MILD CONSTIPATION. Patient taking differently: Take 100 mg by mouth 2 (two) times daily as needed for mild constipation. 09/05/22  Yes Gottschalk, Ashly M, DO  FLORASTOR 250 MG capsule TAKE 1 CAPSULE BY MOUTH TWICE DAILY. Patient taking differently: Take 250 mg by mouth 2 (two) times daily. 03/30/22  Yes Gottschalk, Ashly M, DO  fluticasone (FLONASE) 50 MCG/ACT nasal spray USE 1 SPRAY IN EACH NOSTRIL DAILY AS NEEDED FOR SEASONAL ALLERGIES & RHINITIS Patient taking differently: Place 1 spray into both nostrils daily as needed for allergies. 01/29/22  Yes Gottschalk, Ashly M, DO  furosemide (LASIX) 20 MG tablet TAKE (1/2) TABLET BY MOUTH ONCE DAILY. Patient taking differently: Take 10 mg by mouth daily. 05/01/22  Yes Gottschalk, Ashly M, DO  levETIRAcetam (KEPPRA) 500 MG tablet Take 1 tablet (500 mg total) by mouth 2 (two) times daily. 07/03/22  Yes Cameron Sprang, MD  levothyroxine (SYNTHROID) 75 MCG tablet TAKE (1) TABLET BY MOUTH ONCE DAILY BEFORE BREAKFAST. Patient taking differently: Take 75 mcg by mouth daily before breakfast. 09/05/22  Yes Gottschalk, Ashly M, DO  magnesium oxide (MAG-OX) 400 (240 Mg)  MG tablet TAKE (1/2) TABLET (='200MG'$ ) BY MOUTH TWICE DAILY. Patient taking differently: Take 200 mg by mouth 2 (two) times daily. 07/18/22  Yes Gottschalk, Leatrice Jewels M, DO  Menthol-Methyl Salicylate (MUSCLE RUB) 10-15 % CREA Apply 1 application topically 2 (two) times daily as needed for muscle pain. 09/24/17  Yes Nita Sells, MD  Multiple Vitamins-Minerals (MULTIVITAMIN) tablet TAKE (1) TABLET BY MOUTH ONCE DAILY. Patient taking differently: Take 1 tablet by mouth  daily. 09/14/22  Yes Gottschalk, Ashly M, DO  omeprazole (PRILOSEC) 20 MG capsule TAKE 1 CAPSULE BY MOUTH ONCE A DAY. Patient taking differently: Take 20 mg by mouth daily. 05/01/22  Yes Ronnie Doss M, DO      Allergies    Sulfa antibiotics, Lorazepam, Other, and Darvon [propoxyphene]    Review of Systems   Review of Systems  Physical Exam Updated Vital Signs BP (!) 96/57   Pulse (!) 59   Temp 99 F (37.2 C) (Oral)   Resp (!) 23   SpO2 100%  Physical Exam Constitutional:      General: She is not in acute distress. HENT:     Head: Normocephalic and atraumatic.  Eyes:     Conjunctiva/sclera: Conjunctivae normal.     Pupils: Pupils are equal, round, and reactive to light.  Cardiovascular:     Rate and Rhythm: Normal rate and regular rhythm.  Pulmonary:     Effort: Pulmonary effort is normal. No respiratory distress.  Abdominal:     General: There is no distension.     Tenderness: There is abdominal tenderness in the left lower quadrant.  Skin:    General: Skin is warm and dry.  Neurological:     General: No focal deficit present.     Mental Status: She is alert. Mental status is at baseline.  Psychiatric:        Mood and Affect: Mood normal.        Behavior: Behavior normal.     ED Results / Procedures / Treatments   Labs (all labs ordered are listed, but only abnormal results are displayed) Labs Reviewed  COMPREHENSIVE METABOLIC PANEL - Abnormal; Notable for the following components:      Result Value   Glucose, Bld 129 (*)    All other components within normal limits  CBC WITH DIFFERENTIAL/PLATELET  LIPASE, BLOOD  LACTIC ACID, PLASMA  URINALYSIS, ROUTINE W REFLEX MICROSCOPIC    EKG EKG Interpretation  Date/Time:  Saturday October 06 2022 12:55:49 EST Ventricular Rate:  64 PR Interval:  132 QRS Duration: 89 QT Interval:  417 QTC Calculation: 431 R Axis:   -25 Text Interpretation: Sinus rhythm Borderline left axis deviation Abnormal R-wave  progression, late transition Borderline T abnormalities, anterior leads Confirmed by Octaviano Glow [phone]) on 10/06/2022 2:46:52 PM  Radiology CT Angio Abd/Pel W and/or Wo Contrast  Result Date: 10/06/2022 CLINICAL DATA:  Left lower quadrant abdominal pain and nausea. EXAM: CTA ABDOMEN AND PELVIS WITHOUT AND WITH CONTRAST TECHNIQUE: Multidetector CT imaging of the abdomen and pelvis was performed using the standard protocol during bolus administration of intravenous contrast. Multiplanar reconstructed images and MIPs were obtained and reviewed to evaluate the vascular anatomy. RADIATION DOSE REDUCTION: This exam was performed according to the departmental dose-optimization program which includes automated exposure control, adjustment of the mA and/or kV according to patient size and/or use of iterative reconstruction technique. CONTRAST:  184m OMNIPAQUE IOHEXOL 350 MG/ML SOLN COMPARISON:  None Available. FINDINGS: VASCULAR Aorta: Moderate atheromatous plaque throughout the abdominal aorta. No aneurysm  or dissection. Celiac: Atheromatous plaque at the origin of the celiac artery, contributing to moderate stenosis. No occlusion. Mildly prominent gastroduodenal artery with some branches extending up through the hiatal hernia. SMA: Atheromatous plaque at the origin of the SMA dorsally on image 119 series 9 without substantial stenosis. There is also plaque further distally in the SMA as on image 70 series 4. No filling defect is identified in visualized branches of the SMA. No critical stenosis or occlusion of the SMA observed. Renals: High-grade stenosis of the proximal single right renal artery on image 80 of series 7 due to atheromatous plaque. There is atheromatous plaque in the single left renal artery with only mild stenosis. IMA: Patent Inflow: Atheromatous plaque noted in the iliac arteries without high-grade stenosis. Proximal Outflow: Unremarkable Veins: Unremarkable Review of the MIP images confirms  the above findings. NON-VASCULAR Lower chest: Mosaic attenuation and interstitial accentuation at the lung bases. Large hiatal hernia containing most of the stomach. No pleural effusion. Mild cardiomegaly. Descending thoracic aortic atherosclerotic vascular calcification. Hepatobiliary: Gallbladder absent. No substantial biliary dilatation. No focal hepatic parenchymal lesion identified. Pancreas: Punctate calcifications along the pancreatic parenchyma suggesting chronic calcific pancreatitis. Spleen: Unremarkable Adrenals/Urinary Tract: Fullness of both adrenal glands without a discrete mass. Mild scattered scarring in the right kidney. No hydronephrosis or hydroureter. No urinary tract calculi. 8 by 7 mm left kidney lower pole hypodense lesion is technically too small to characterize although statistically likely to be benign. No further imaging workup of this lesion is indicated. Stomach/Bowel: Very large hiatal hernia. Descending duodenal diverticulum without findings of inflammation. Sigmoid colon diverticulosis. No findings of active diverticulitis. No pneumatosis or portal venous gas. No obvious hypoenhancing regions of bowel wall or significant abnormal bowel wall thickening are identified. Lymphatic: No pathologic adenopathy. Reproductive: Uterus absent.  Adnexa unremarkable. Other: No supplemental non-categorized findings. Musculoskeletal: Chronic mild superior endplate compression fracture at L3. Suspected right eccentric hemangioma at L2. Chronic lumbar spondylosis and degenerative disc disease causing multilevel impingement. No acute lumbar spine fracture is observed. IMPRESSION: 1. No findings of active mesenteric ischemia. 2. 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 However, the celiac artery is not occluded in the SMA and the IMA appear patent. 3. High-grade stenosis of the proximal single right renal artery due to atheromatous plaque. 4. Large hiatal hernia  containing most of the stomach. 5. Mosaic attenuation and interstitial accentuation at the lung bases, query air trapping or mild edema. 6. Sigmoid colon diverticulosis without findings of active diverticulitis. 7. Chronic mild superior endplate compression fracture at L3. Chronic lumbar spondylosis and degenerative disc disease causing multilevel impingement. 8. Punctate calcifications along the pancreatic parenchyma suggesting chronic calcific pancreatitis. 9. Aortic atherosclerosis. Aortic Atherosclerosis (ICD10-I70.0). Electronically Signed   By: Van Clines M.D.   On: 10/06/2022 14:45    Procedures Procedures    Medications Ordered in ED Medications  ketorolac (TORADOL) 30 MG/ML injection 30 mg (30 mg Intravenous Given 10/06/22 1245)  ondansetron (ZOFRAN) injection 4 mg (4 mg Intravenous Given 10/06/22 1245)  iohexol (OMNIPAQUE) 350 MG/ML injection 100 mL (100 mLs Intravenous Contrast Given 10/06/22 1355)    ED Course/ Medical Decision Making/ A&P Clinical Course as of 10/06/22 1518  Sat Oct 06, 2022  1508 Patient is feeling better.  No emergent findings on CT imaging or lab test.  We are awaiting urine sample she is providing now.  If positive will treat for UTI and likely discharge home.  If negative she can  be discharged home with Bentyl and supportive care, PCP follow-up. [MT]  8119 Patient signed out to Dr Roderic Palau EDP pending f/u on UA. [MT]    Clinical Course User Index [MT] Sarahy Creedon, Carola Rhine, MD                           Medical Decision Making Amount and/or Complexity of Data Reviewed Labs: ordered. Radiology: ordered. ECG/medicine tests: ordered.  Risk Prescription drug management.   This patient presents to the ED with concern for abdominal pain. This involves an extensive number of treatment options, and is a complaint that carries with it a high risk of complications and morbidity.  The differential diagnosis includes diverticulitis versus colitis versus UTI  versus kidney stone versus other  Location of the patient's pain is most consistent with diverticulitis, given her advanced age, cannot exclude the possibility of mesenteric colitis as well.   Additional history obtained from patient's daughter at bedside   I ordered and personally interpreted labs.  The pertinent results include: Labs are unremarkable.  UA was pending at the time of signout   I ordered imaging studies including CT abdomen pelvis angiography with contrast I independently visualized and interpreted imaging which showed no acute emergent findings explain her symptoms.  She has sigmoid diverticulosis.  Some chronic stenosis of the arteries but no acute vascular occlusion that would correlate with her symptoms today.  No AAA. I agree with the radiologist interpretation  The patient was maintained on a cardiac monitor.  I personally viewed and interpreted the cardiac monitored which showed an underlying rhythm of: Normal sinus rhythm  Per my interpretation the patient's ECG shows no acute ischemic findings  I ordered medication including Toradol for abdominal pain.  I have reviewed the patients home medicines and have made adjustments as needed  After the interventions noted above, I reevaluated the patient and found that they have: improved   Dispostion:  After consideration of the diagnostic results and the patients response to treatment, I feel that the patent would benefit from outpatient PCP follow-up         Final Clinical Impression(s) / ED Diagnoses Final diagnoses:  Abdominal pain, unspecified abdominal location    Rx / DC Orders ED Discharge Orders          Ordered    dicyclomine (BENTYL) 20 MG tablet  2 times daily PRN        10/06/22 1518              Wyvonnia Dusky, MD 10/06/22 [phone]

## 2022-10-09 ENCOUNTER — Encounter: Payer: Self-pay | Admitting: Nurse Practitioner

## 2022-10-09 ENCOUNTER — Ambulatory Visit (INDEPENDENT_AMBULATORY_CARE_PROVIDER_SITE_OTHER): Payer: Medicare Other | Admitting: Nurse Practitioner

## 2022-10-09 VITALS — BP 139/76 | HR 65 | Temp 97.7°F | Resp 20 | Ht 59.0 in | Wt 134.0 lb

## 2022-10-09 DIAGNOSIS — R1084 Generalized abdominal pain: Secondary | ICD-10-CM

## 2022-10-09 DIAGNOSIS — Z09 Encounter for follow-up examination after completed treatment for conditions other than malignant neoplasm: Secondary | ICD-10-CM | POA: Diagnosis not present

## 2022-10-09 NOTE — Progress Notes (Signed)
   Subjective:    Patient ID: Samantha Woods, female    DOB: May 29, 1921, 88 y.o.   MRN: [id number]   Chief Complaint: Hospitalization Follow-up   HPI Patient went to the ED on Saturday with lower abdominal cramping. Had abdominal CT scan which was negative. Pain has subsided.     Review of Systems  Constitutional:  Negative for diaphoresis.  Eyes:  Negative for pain.  Respiratory:  Negative for shortness of breath.   Cardiovascular:  Negative for chest pain, palpitations and leg swelling.  Gastrointestinal:  Negative for abdominal pain, constipation, nausea and vomiting.  Endocrine: Negative for polydipsia.  Skin:  Negative for rash.  Neurological:  Negative for dizziness, weakness and headaches.  Hematological:  Does not bruise/bleed easily.  All other systems reviewed and are negative.      Objective:   Physical Exam Constitutional:      Appearance: Normal appearance.  Cardiovascular:     Rate and Rhythm: Normal rate and regular rhythm.     Heart sounds: Normal heart sounds.  Pulmonary:     Effort: Pulmonary effort is normal.     Breath sounds: Rales (fine bil bases) present.  Abdominal:     General: Bowel sounds are normal.     Palpations: Abdomen is soft.     Tenderness: There is no abdominal tenderness. There is no right CVA tenderness, left CVA tenderness, guarding or rebound.  Skin:    General: Skin is warm.  Neurological:     General: No focal deficit present.     Mental Status: She is alert and oriented to person, place, and time.  Psychiatric:        Mood and Affect: Mood normal.        Behavior: Behavior normal.    BP 139/76   Pulse 65   Temp 97.7 F (36.5 C) (Temporal)   Resp 20   Ht '4\' 11"'$  (1.499 m)   Wt 134 lb (60.8 kg)   SpO2 99%   BMI 27.06 kg/m         Assessment & Plan:   Christie Viscomi in today with chief complaint of Hospitalization Follow-up   1. Generalized abdominal pain Continue mirlax  2. Hospital discharge  follow-up Hospital records reviewed    The above assessment and management plan was discussed with the patient. The patient verbalized understanding of and has agreed to the management plan. Patient is aware to call the clinic if symptoms persist or worsen. Patient is aware when to return to the clinic for a follow-up visit. Patient educated on when it is appropriate to go to the emergency department.   Mary-Margaret Hassell Done, FNP

## 2022-10-10 ENCOUNTER — Other Ambulatory Visit: Payer: Self-pay | Admitting: Family Medicine

## 2022-10-10 DIAGNOSIS — K5909 Other constipation: Secondary | ICD-10-CM

## 2022-10-10 MED ORDER — TRULANCE 3 MG PO TABS: 1.0000 | ORAL_TABLET | Freq: Every day | ORAL | 0 refills | Status: DC

## 2022-10-12 ENCOUNTER — Telehealth: Payer: Self-pay

## 2022-10-12 NOTE — Telephone Encounter (Signed)
     Patient  visit on 10/06/2022  at [facility] was for Unspecified abdominal pain.  Have you been able to follow up with your primary care physician? Yes on 10/09/22  The patient was or was not able to obtain any needed medicine or equipment. Patient was able to obtain medications.  Are there diet recommendations that you are having difficulty following? No  Patient expresses understanding of discharge instructions and education provided has no other needs at this time.    [place] Resource Care Guide   [email or web address]  ?? [phone]   Website: [email or web address]  [email or web address]

## 2022-10-30 ENCOUNTER — Encounter: Payer: Self-pay | Admitting: Podiatry

## 2022-10-30 ENCOUNTER — Ambulatory Visit: Payer: Medicare Other | Admitting: Podiatry

## 2022-10-30 VITALS — BP 127/55

## 2022-10-30 DIAGNOSIS — B351 Tinea unguium: Secondary | ICD-10-CM | POA: Diagnosis not present

## 2022-10-30 DIAGNOSIS — M79674 Pain in right toe(s): Secondary | ICD-10-CM

## 2022-10-30 DIAGNOSIS — M79675 Pain in left toe(s): Secondary | ICD-10-CM

## 2022-10-30 NOTE — Progress Notes (Signed)
This patient returns to the office for evaluation and treatment of long thick painful nails .  This patient is unable to trim her own nails since the patient cannot reach her feet.  Patient says the nails are painful walking and wearing her shoes.  She returns for preventive foot care services.  She is accompanied by female caregiver.  General Appearance  Alert, conversant and in no acute stress.  Vascular  Dorsalis pedis and posterior tibial  pulses are   weakly  palpable  bilaterally.  Capillary return is within normal limits  bilaterally. Cold feet. bilaterally.  Neurologic  Senn-Weinstein monofilament wire test within normal limits  bilaterally. Muscle power within normal limits bilaterally.  Nails Thick disfigured discolored nails with subungual debris  from hallux to fifth toes bilaterally. No evidence of bacterial infection or drainage bilaterally.  Orthopedic  No limitations of motion  feet .  No crepitus or effusions noted.  No bony pathology or digital deformities noted.  Skin  normotropic skin with no porokeratosis noted bilaterally.  No signs of infections or ulcers noted.     Onychomycosis  Pain in toes right foot  Pain in toes left foot  Debridement  of nails  1-5  B/L with a nail nipper.  Nails were then filed using a dremel tool with no incidents.  RTC 4  months    Gardiner Barefoot DPM

## 2022-11-05 ENCOUNTER — Ambulatory Visit (INDEPENDENT_AMBULATORY_CARE_PROVIDER_SITE_OTHER): Payer: Medicare Other

## 2022-11-05 DIAGNOSIS — H6121 Impacted cerumen, right ear: Secondary | ICD-10-CM

## 2022-11-05 NOTE — Progress Notes (Signed)
Patient seen due to daughter thinking hearing air bud was lodged in pt's ear. I looked and did not see anything but an impacted ear due to wax. Caryl Pina cross also looked and seen Impacted ear with wax and no hearing aid bud. I let patient leave and she scheduled appt to get her ears cleaned.

## 2022-11-06 ENCOUNTER — Encounter: Payer: Self-pay | Admitting: Family

## 2022-11-06 ENCOUNTER — Ambulatory Visit (INDEPENDENT_AMBULATORY_CARE_PROVIDER_SITE_OTHER): Payer: Medicare Other | Admitting: Family

## 2022-11-06 VITALS — BP 128/65 | HR 72 | Temp 97.2°F | Ht 59.0 in | Wt 136.0 lb

## 2022-11-06 DIAGNOSIS — H6121 Impacted cerumen, right ear: Secondary | ICD-10-CM | POA: Diagnosis not present

## 2022-11-06 NOTE — Progress Notes (Addendum)
   Subjective:    Patient ID: Samantha Woods, female    DOB: [birth year], 88 y.o.   MRN: [id number]  Chief Complaint  Patient presents with   Cerumen Impaction   Pt presents to the office today with right ear fullness. She states she tried putting her hearing aid in yesterday, but got stuck in the wax. She used some OTC drops yesterday.  Ear Fullness  There is pain in the right ear. This is a new problem. The current episode started 1 to 4 weeks ago. The problem occurs constantly. There has been no fever. The pain is at a severity of 0/10. The patient is experiencing no pain. Associated symptoms include hearing loss. Pertinent negatives include no coughing, diarrhea, ear discharge, rhinorrhea, sore throat or vomiting.      Review of Systems  HENT:  Positive for hearing loss. Negative for ear discharge, rhinorrhea and sore throat.   Respiratory:  Negative for cough.   Gastrointestinal:  Negative for diarrhea and vomiting.  All other systems reviewed and are negative.      Objective:   Physical Exam Vitals reviewed.  Constitutional:      General: She is not in acute distress.    Appearance: She is well-developed.  HENT:     Head: Normocephalic and atraumatic.     Comments: HOH    Right Ear: Tympanic membrane normal. Impacted cerumen: mild cerumen.     Left Ear: Tympanic membrane normal.  Eyes:     Pupils: Pupils are equal, round, and reactive to light.  Neck:     Thyroid: No thyromegaly.  Cardiovascular:     Rate and Rhythm: Normal rate and regular rhythm.     Heart sounds: Normal heart sounds. No murmur heard. Pulmonary:     Effort: Pulmonary effort is normal. No respiratory distress.     Breath sounds: Normal breath sounds. No wheezing.  Abdominal:     General: Bowel sounds are normal. There is no distension.     Palpations: Abdomen is soft.     Tenderness: There is no abdominal tenderness.  Musculoskeletal:        General: No tenderness. Normal range of motion.      Cervical back: Normal range of motion and neck supple.  Skin:    General: Skin is warm and dry.  Neurological:     Mental Status: She is alert and oriented to person, place, and time.     Cranial Nerves: No cranial nerve deficit.     Deep Tendon Reflexes: Reflexes are normal and symmetric.  Psychiatric:        Behavior: Behavior normal.        Thought Content: Thought content normal.        Judgment: Judgment normal.   Curette used to remove small amount of wax in right ear. Pt tolerated well.   BP 128/65   Pulse 72   Temp (!) 97.2 F (36.2 C) (Temporal)   Ht '4\' 11"'$  (1.499 m)   Wt 136 lb (61.7 kg)   SpO2 93%   BMI 27.47 kg/m        Assessment & Plan:  Samantha Woods comes in today with chief complaint of Cerumen Impaction   Diagnosis and orders addressed:  1. Impacted cerumen of right ear Use OTC drops  Continue to use hearing aid Keep follow up with PCP   Evelina Dun, FNP

## 2022-11-06 NOTE — Patient Instructions (Signed)

## 2022-11-08 ENCOUNTER — Other Ambulatory Visit: Payer: Self-pay | Admitting: Family Medicine

## 2022-11-08 DIAGNOSIS — L820 Inflamed seborrheic keratosis: Secondary | ICD-10-CM | POA: Diagnosis not present

## 2022-11-08 DIAGNOSIS — L708 Other acne: Secondary | ICD-10-CM | POA: Diagnosis not present

## 2022-11-08 NOTE — Telephone Encounter (Signed)
Patients daughter says pt will be out of medicine on Monday.

## 2022-11-21 ENCOUNTER — Emergency Department (HOSPITAL_COMMUNITY)
Admission: EM | Admit: 2022-11-21 | Discharge: 2022-11-21 | Disposition: A | Discharge status: Home / Self Care | Payer: Medicare Other | Attending: Emergency Medicine | Admitting: Emergency Medicine

## 2022-11-21 ENCOUNTER — Other Ambulatory Visit: Payer: Self-pay

## 2022-11-21 ENCOUNTER — Encounter (HOSPITAL_COMMUNITY): Payer: Self-pay

## 2022-11-21 ENCOUNTER — Emergency Department (HOSPITAL_COMMUNITY): Payer: Medicare Other

## 2022-11-21 ENCOUNTER — Telehealth: Payer: Medicare Other | Admitting: Nurse Practitioner

## 2022-11-21 DIAGNOSIS — B974 Respiratory syncytial virus as the cause of diseases classified elsewhere: Secondary | ICD-10-CM | POA: Insufficient documentation

## 2022-11-21 DIAGNOSIS — Z1152 Encounter for screening for COVID-19: Secondary | ICD-10-CM | POA: Insufficient documentation

## 2022-11-21 DIAGNOSIS — R059 Cough, unspecified: Secondary | ICD-10-CM | POA: Diagnosis not present

## 2022-11-21 DIAGNOSIS — R051 Acute cough: Secondary | ICD-10-CM | POA: Diagnosis not present

## 2022-11-21 DIAGNOSIS — K449 Diaphragmatic hernia without obstruction or gangrene: Secondary | ICD-10-CM | POA: Diagnosis not present

## 2022-11-21 LAB — CBC WITH DIFFERENTIAL/PLATELET
Abs Immature Granulocytes: 0.02 10*3/uL (ref 0.00–0.07)
Basophils Absolute: 0 10*3/uL (ref 0.0–0.1)
Basophils Relative: 1 %
Eosinophils Absolute: 0.2 10*3/uL (ref 0.0–0.5)
Eosinophils Relative: 5 %
HCT: 42.3 % (ref 36.0–46.0)
Hemoglobin: 13.3 g/dL (ref 12.0–15.0)
Immature Granulocytes: 0 %
Lymphocytes Relative: 16 %
Lymphs Abs: 0.7 10*3/uL (ref 0.7–4.0)
MCH: 31.2 pg (ref 26.0–34.0)
MCHC: 31.4 g/dL (ref 30.0–36.0)
MCV: 99.3 fL (ref 80.0–100.0)
Monocytes Absolute: 0.8 10*3/uL (ref 0.1–1.0)
Monocytes Relative: 16 %
Neutro Abs: 2.9 10*3/uL (ref 1.7–7.7)
Neutrophils Relative %: 62 %
Platelets: 179 10*3/uL (ref 150–400)
RBC: 4.26 MIL/uL (ref 3.87–5.11)
RDW: 14 % (ref 11.5–15.5)
WBC: 4.7 10*3/uL (ref 4.0–10.5)
nRBC: 0 % (ref 0.0–0.2)

## 2022-11-21 LAB — RESP PANEL BY RT-PCR (RSV, FLU A&B, COVID)  RVPGX2
Influenza A by PCR: NEGATIVE
Influenza B by PCR: NEGATIVE
Resp Syncytial Virus by PCR: POSITIVE — AB
SARS Coronavirus 2 by RT PCR: NEGATIVE

## 2022-11-21 LAB — COMPREHENSIVE METABOLIC PANEL
ALT: 21 U/L (ref 0–44)
AST: 34 U/L (ref 15–41)
Albumin: 3.6 g/dL (ref 3.5–5.0)
Alkaline Phosphatase: 89 U/L (ref 38–126)
Anion gap: 8 (ref 5–15)
BUN: 16 mg/dL (ref 8–23)
CO2: 29 mmol/L (ref 22–32)
Calcium: 8.7 mg/dL — ABNORMAL LOW (ref 8.9–10.3)
Chloride: 103 mmol/L (ref 98–111)
Creatinine, Ser: 0.76 mg/dL (ref 0.44–1.00)
GFR, Estimated: >60 mL/min (ref >60)
Glucose, Bld: 107 mg/dL — ABNORMAL HIGH (ref 70–99)
Potassium: 4.5 mmol/L (ref 3.5–5.1)
Sodium: 140 mmol/L (ref 135–145)
Total Bilirubin: 0.8 mg/dL (ref 0.3–1.2)
Total Protein: 6.6 g/dL (ref 6.5–8.1)

## 2022-11-21 MED ORDER — BENZONATATE 100 MG PO CAPS: 100.0000 mg | ORAL_CAPSULE | Freq: Three times a day (TID) | ORAL | 0 refills | Status: DC

## 2022-11-21 NOTE — ED Provider Notes (Signed)
[facility] EMERGENCY DEPARTMENT Provider Note   CSN: [id number] Arrival date & time: 11/21/22  4742     History Chief Complaint  Patient presents with   Cough    HPI Samantha Woods is a 88 y.o. female presenting for cough and shortness of breath.  Please see my same-day triage note for complete history..   Patient's recorded medical, surgical, social, medication list and allergies were reviewed in the Snapshot window as part of the initial history.   Review of Systems   Review of Systems  Constitutional:  Positive for fatigue. Negative for chills and fever.  HENT:  Positive for congestion. Negative for ear pain and sore throat.   Eyes:  Negative for pain and visual disturbance.  Respiratory:  Positive for cough. Negative for shortness of breath.   Cardiovascular:  Negative for chest pain and palpitations.  Gastrointestinal:  Negative for abdominal pain and vomiting.  Genitourinary:  Negative for dysuria and hematuria.  Musculoskeletal:  Negative for arthralgias and back pain.  Skin:  Negative for color change and rash.  Neurological:  Negative for seizures and syncope.  All other systems reviewed and are negative.   Physical Exam Updated Vital Signs BP 138/83 (BP Location: Right Arm)   Pulse 73   Temp 98.4 F (36.9 C) (Oral)   Resp 20   Ht '4\' 11"'$  (1.499 m)   Wt 61.2 kg   SpO2 96%   BMI 27.27 kg/m  Physical Exam Vitals and nursing note reviewed.  Constitutional:      General: She is not in acute distress.    Appearance: She is well-developed.  HENT:     Head: Normocephalic and atraumatic.  Eyes:     Conjunctiva/sclera: Conjunctivae normal.  Cardiovascular:     Rate and Rhythm: Normal rate and regular rhythm.     Heart sounds: No murmur heard. Pulmonary:     Effort: Pulmonary effort is normal. No respiratory distress.     Breath sounds: Normal breath sounds.  Abdominal:     General: There is no distension.     Palpations: Abdomen is soft.      Tenderness: There is no abdominal tenderness. There is no right CVA tenderness or left CVA tenderness.  Musculoskeletal:        General: No swelling or tenderness. Normal range of motion.     Cervical back: Neck supple.  Skin:    General: Skin is warm and dry.  Neurological:     General: No focal deficit present.     Mental Status: She is alert and oriented to person, place, and time. Mental status is at baseline.     Cranial Nerves: No cranial nerve deficit.      ED Course/ Medical Decision Making/ A&P    Procedures Procedures   Medications Ordered in ED Medications - No data to display Medical Decision Making:   Samantha Woods is a 88 y.o. female who presented to the ED today with subjective fever, cough, congestion detailed above.    Additional history discussed with patient's family/caregivers.  Patient placed on continuous vitals and telemetry monitoring while in ED which was reviewed periodically.   Complete initial physical exam performed, notably the patient  was hemodynamically stable in no acute distress.  Posterior oropharynx illuminated and without obvious swelling or deformity.  Patient is without neck stiffness.    Reviewed and confirmed nursing documentation for past medical history, family history, social history.    Initial Assessment:   With the patient's presentation of  fever cough congestion, most likely diagnosis is developing viral upper respiratory infection. Other diagnoses were considered including (but not limited to) peritonsillar abscess, retropharyngeal abscess, pneumonia. These are considered less likely due to history of present illness and physical exam findings.   This is most consistent with an acute life/limb threatening illness complicated by underlying chronic conditions. Considered meningitis, however patient's symptoms, vital signs, physical exam findings including lack of meningismus seem grossly less consistent at this time. Initial Plan:   Screening labs including CBC and Metabolic panel to evaluate for infectious or metabolic etiology of disease.  Viral screening including COVID/flu testing to evaluate for common viral etiologies that need to be tracked CXR to evaluate for structural/infectious intrathoracic pathology.  Empiric treatment with antipyretics including acetaminophen in ambulatory setting Objective evaluation as below reviewed   Initial Study Results:   Laboratory  All laboratory results reviewed without evidence of clinically relevant pathology.   Radiology:  All images reviewed independently. Agree with radiology report at this time.   DG Chest 2 View  Final Result         Final Assessment and Plan:   On reassessment, patient is ambulatory tolerating p.o. intake in no acute distress.   Patient is currently stable for outpatient care and management with no indication for hospitalization or transfer at this time.  Discussed all findings with patient expressed understanding.  Disposition:  Based on the above findings, I believe patient is stable for discharge.    Patient/family educated about specific return precautions for given chief complaint and symptoms.  Patient/family educated about follow-up with PCP.     Patient/family expressed understanding of return precautions and need for follow-up. Patient spoken to regarding all imaging and laboratory results and appropriate follow up for these results. All education provided in verbal form with additional information in written form. Time was allowed for answering of patient questions. Patient discharged.    Emergency Department Medication Summary:   Medications - No data to display         Clinical Impression:  1. Acute cough      Discharge  Clinical Impression:  1. Acute cough      Discharge   Final Clinical Impression(s) / ED Diagnoses Final diagnoses:  Acute cough    Rx / DC Orders ED Discharge Orders          Ordered     benzonatate (TESSALON) 100 MG capsule  Every 8 hours,   Status:  Discontinued        11/21/22 1216    benzonatate (TESSALON) 100 MG capsule  Every 8 hours        11/21/22 1216              Tretha Sciara, MD 11/21/22 1216

## 2022-11-21 NOTE — ED Provider Triage Note (Signed)
Emergency Medicine Provider Triage Evaluation Note  Samantha Woods , a 88 y.o. female  was evaluated in triage.  Pt complains of fever malaise, cough over the last 4 days.  Sick contacts at home.  Here with her caregiver.  She has had normal p.o. intake.  Caregiver is concerned for viral illness or pneumonia.  Cough seems to be getting worse a told to come in for evaluation for pneumonia.  Review of Systems  Positive: Cough congestion Negative: Nausea vomiting diarrhea  Physical Exam  BP 138/83 (BP Location: Right Arm)   Pulse 73   Temp 98.4 F (36.9 C) (Oral)   Resp 20   Ht '4\' 11"'$  (1.499 m)   Wt 61.2 kg   SpO2 96%   BMI 27.27 kg/m  Gen:   Awake, no distress   Resp:  Normal effort  MSK:   Moves extremities without difficulty  Other:    Medical Decision Making  Medically screening exam initiated at 10:33 AM.  Appropriate orders placed.  Samantha Woods was informed that the remainder of the evaluation will be completed by another provider, this initial triage assessment does not replace that evaluation, and the importance of remaining in the ED until their evaluation is complete.     Tretha Sciara, MD 11/21/22 1034

## 2022-11-21 NOTE — ED Triage Notes (Signed)
Reports productive cough  with clear sputum since Sunday. Denies any pain or fever.

## 2022-11-23 ENCOUNTER — Encounter: Payer: Self-pay | Admitting: Family Medicine

## 2022-11-29 ENCOUNTER — Ambulatory Visit (INDEPENDENT_AMBULATORY_CARE_PROVIDER_SITE_OTHER): Payer: Medicare Other | Admitting: Family Medicine

## 2022-11-29 ENCOUNTER — Encounter: Payer: Self-pay | Admitting: Family Medicine

## 2022-11-29 VITALS — BP 136/82 | HR 90 | Temp 97.3°F | Ht 59.0 in | Wt 133.4 lb

## 2022-11-29 DIAGNOSIS — B338 Other specified viral diseases: Secondary | ICD-10-CM

## 2022-11-29 DIAGNOSIS — J069 Acute upper respiratory infection, unspecified: Secondary | ICD-10-CM

## 2022-11-29 DIAGNOSIS — B974 Respiratory syncytial virus as the cause of diseases classified elsewhere: Secondary | ICD-10-CM | POA: Diagnosis not present

## 2022-11-29 DIAGNOSIS — R0989 Other specified symptoms and signs involving the circulatory and respiratory systems: Secondary | ICD-10-CM | POA: Diagnosis not present

## 2022-11-29 DIAGNOSIS — R062 Wheezing: Secondary | ICD-10-CM

## 2022-11-29 MED ORDER — BENZONATATE 100 MG PO CAPS: 100.0000 mg | ORAL_CAPSULE | Freq: Three times a day (TID) | ORAL | 0 refills | Status: DC

## 2022-11-29 MED ORDER — PREDNISONE 20 MG PO TABS: 40.0000 mg | ORAL_TABLET | Freq: Every day | ORAL | 0 refills | Status: AC

## 2022-11-29 MED ORDER — AMOXICILLIN-POT CLAVULANATE 875-125 MG PO TABS: 1.0000 | ORAL_TABLET | Freq: Two times a day (BID) | ORAL | 0 refills | Status: AC

## 2022-11-29 MED ORDER — AZITHROMYCIN 250 MG PO TABS: ORAL_TABLET | ORAL | 0 refills | Status: DC

## 2022-11-29 NOTE — Progress Notes (Addendum)
Subjective:  Patient ID: Samantha Woods, female    DOB: Jun 14, 1921, 89 y.o.   MRN: [id number]  Patient Care Team: Janora Norlander, DO as PCP - General (Family Medicine) Cameron Sprang, MD as Consulting Physician (Neurology)   Chief Complaint:  ER follow up (AP 10/27- RSV)   HPI: Samantha Woods is a 89 y.o. female presenting on 11/29/2022 for ER follow up (AP 10/27- RSV)   Pt presents today for ongoing cough, weakness, and fatigue. She was seen in the ED 11/21/2022 and diagnosed with RSV. CXR was negative for PNA. She was treated with tessalon for her cough. States she is getting better but still has the cough and fatigue. Reports urine output is normal. No fever reported but states she has had chills. No confusion or shortness of breath.     Relevant past medical, surgical, family, and social history reviewed and updated as indicated.  Allergies and medications reviewed and updated. Data reviewed: Chart in Epic.   Past Medical History:  Diagnosis Date   GERD (gastroesophageal reflux disease)    Hypertension    Seizure ([place])    Thyroid disease     Past Surgical History:  Procedure Laterality Date   ABDOMINAL HYSTERECTOMY     GALLBLADDER SURGERY      Social History   Socioeconomic History   Marital status: Widowed    Spouse name: Not on file   Number of children: Not on file   Years of education: Not on file   Highest education level: Not on file  Occupational History   Not on file  Tobacco Use   Smoking status: Never   Smokeless tobacco: Never  Vaping Use   Vaping Use: Never used  Substance and Sexual Activity   Alcohol use: Yes    Alcohol/week: 0.0 standard drinks of alcohol    Comment: occasional   Drug use: No   Sexual activity: Not on file  Other Topics Concern   Not on file  Social History Narrative   Pt lives in a 1 story home with her son and his wife one level   Has 3 adult children   Programmer, systems   Retired from [employer]    Caffeine 4 cups daily   Social Determinants of Health   Financial Resource Strain: Not on file  Food Insecurity: Not on file  Transportation Needs: Not on file  Physical Activity: Not on file  Stress: Not on file  Social Connections: Not on file  Intimate Partner Violence: Not on file    Outpatient Encounter Medications as of 11/29/2022  Medication Sig   acetaminophen (TYLENOL) 325 MG tablet TAKE 2 TABLETS BY MOUTH TWICE DAILY. (Patient taking differently: Take 650 mg by mouth in the morning and at bedtime.)   amoxicillin-clavulanate (AUGMENTIN) 875-125 MG tablet Take 1 tablet by mouth 2 (two) times daily for 7 days.   azithromycin (ZITHROMAX Z-PAK) 250 MG tablet As directed   celecoxib (CELEBREX) 100 MG capsule TAKE 1 CAPSULE BY MOUTH TWICE DAILY. (Patient taking differently: Take 100 mg by mouth 2 (two) times daily.)   cetirizine (ZYRTEC) 10 MG tablet TAKE (1/2) TABLET BY MOUTH ONCE DAILY. (Patient taking differently: Take 5 mg by mouth daily. TAKE (1/2) TABLET BY MOUTH ONCE DAILY.)   clotrimazole (LOTRIMIN) 1 % cream APPLY 1 APPLICATION TO THE AFFECTED AREA ON THE GROIN FOR RASH 2 TIMES DAILY FOR 2 WEEKS   diclofenac Sodium (GOODSENSE ARTHRITIS PAIN) 1 % GEL APPLY 2  GRAMS TOPICALLY 4 TIMES DAILY   dicyclomine (BENTYL) 20 MG tablet Take 1 tablet (20 mg total) by mouth 2 (two) times daily as needed for up to 14 doses for spasms. Abdominal cramping   docusate sodium (COLACE) 100 MG capsule TAKE (1) CAPSULE BY MOUTH TWICE DAILY AS NEEDED FOR MILD CONSTIPATION. (Patient taking differently: Take 100 mg by mouth 2 (two) times daily as needed for mild constipation.)   FLORASTOR 250 MG capsule TAKE 1 CAPSULE BY MOUTH TWICE DAILY. (Patient taking differently: Take 250 mg by mouth 2 (two) times daily.)   fluticasone (FLONASE) 50 MCG/ACT nasal spray USE 1 SPRAY IN EACH NOSTRIL DAILY AS NEEDED FOR SEASONAL ALLERGIES & RHINITIS (Patient taking differently: Place 1 spray into both nostrils  daily as needed for allergies.)   furosemide (LASIX) 20 MG tablet TAKE (1/2) TABLET BY MOUTH ONCE DAILY. (Patient taking differently: Take 10 mg by mouth daily.)   levETIRAcetam (KEPPRA) 500 MG tablet Take 1 tablet (500 mg total) by mouth 2 (two) times daily.   levothyroxine (SYNTHROID) 75 MCG tablet TAKE (1) TABLET BY MOUTH ONCE DAILY BEFORE BREAKFAST. (Patient taking differently: Take 75 mcg by mouth daily before breakfast.)   loratadine (ALLERGY RELIEF) 10 MG tablet TAKE 1 TABLET BY MOUTH ONCE DAILY.   magnesium oxide (MAG-OX) 400 (240 Mg) MG tablet TAKE (1/2) TABLET (='200MG'$ ) BY MOUTH TWICE DAILY.   Menthol-Methyl Salicylate (MUSCLE RUB) 10-15 % CREA Apply 1 application topically 2 (two) times daily as needed for muscle pain.   Multiple Vitamins-Minerals (MULTIVITAMIN) tablet TAKE (1) TABLET BY MOUTH ONCE DAILY. (Patient taking differently: Take 1 tablet by mouth daily.)   omeprazole (PRILOSEC) 20 MG capsule TAKE 1 CAPSULE BY MOUTH ONCE A DAY. (Patient taking differently: Take 20 mg by mouth daily.)   Plecanatide (TRULANCE) 3 MG TABS Take 1 tablet by mouth daily. For constipation   predniSONE (DELTASONE) 20 MG tablet Take 2 tablets (40 mg total) by mouth daily with breakfast for 5 days. 2 po daily for 5 days   [DISCONTINUED] benzonatate (TESSALON) 100 MG capsule Take 1 capsule (100 mg total) by mouth every 8 (eight) hours.   benzonatate (TESSALON) 100 MG capsule Take 1 capsule (100 mg total) by mouth every 8 (eight) hours.   No facility-administered encounter medications on file as of 11/29/2022.    Allergies  Allergen Reactions   Sulfa Antibiotics Shortness Of Breath   Lorazepam Other (See Comments)    Restless, "fidgety"   Other    Darvon [Propoxyphene] Hives    Review of Systems  Constitutional:  Positive for activity change, appetite change, chills and fatigue. Negative for diaphoresis, fever and unexpected weight change.  HENT: Negative.  Negative for congestion.   Eyes: Negative.    Respiratory:  Positive for cough. Negative for apnea, choking, chest tightness, shortness of breath, wheezing and stridor.   Cardiovascular:  Negative for chest pain, palpitations and leg swelling.  Gastrointestinal:  Negative for abdominal pain, blood in stool, constipation, diarrhea, nausea and vomiting.  Endocrine: Negative.   Genitourinary:  Negative for decreased urine volume, difficulty urinating, dysuria, frequency and urgency.  Musculoskeletal:  Negative for arthralgias and myalgias.  Skin: Negative.   Allergic/Immunologic: Negative.   Neurological:  Negative for dizziness, weakness and headaches.  Hematological: Negative.   Psychiatric/Behavioral:  Negative for hallucinations, sleep disturbance and suicidal ideas.   All other systems reviewed and are negative.       Objective:  BP 136/82   Pulse 90   Temp (!) 97.3  F (36.3 C) (Temporal)   Ht '4\' 11"'$  (1.499 m)   Wt 133 lb 6.4 oz (60.5 kg)   SpO2 95%   BMI 26.94 kg/m    Wt Readings from Last 3 Encounters:  11/29/22 133 lb 6.4 oz (60.5 kg)  11/21/22 135 lb (61.2 kg)  11/06/22 136 lb (61.7 kg)    Physical Exam Vitals and nursing note reviewed.  Constitutional:      General: She is not in acute distress.    Appearance: She is normal weight. She is not ill-appearing, toxic-appearing or diaphoretic.     Comments: Frail elderly  HENT:     Head: Normocephalic and atraumatic.     Right Ear: Decreased hearing noted.     Left Ear: Decreased hearing noted.  Eyes:     Conjunctiva/sclera: Conjunctivae normal.     Pupils: Pupils are equal, round, and reactive to light.  Cardiovascular:     Rate and Rhythm: Normal rate and regular rhythm.     Heart sounds: Normal heart sounds. No murmur heard.    No friction rub. No gallop.  Pulmonary:     Effort: Pulmonary effort is normal. No respiratory distress.     Breath sounds: No stridor. Examination of the right-upper field reveals wheezing. Examination of the left-upper field  reveals wheezing. Examination of the right-lower field reveals wheezing and rhonchi. Examination of the left-lower field reveals wheezing and rhonchi. Wheezing and rhonchi present. No rales.  Chest:     Chest wall: No tenderness.  Skin:    General: Skin is warm and dry.     Capillary Refill: Capillary refill takes less than 2 seconds.  Neurological:     General: No focal deficit present.     Mental Status: She is alert and oriented to person, place, and time.  Psychiatric:        Mood and Affect: Mood normal.        Behavior: Behavior normal.        Thought Content: Thought content normal.        Judgment: Judgment normal.     Results for orders placed or performed during the hospital encounter of 11/21/22  Resp panel by RT-PCR (RSV, Flu A&B, Covid) Anterior Nasal Swab   Specimen: Anterior Nasal Swab  Result Value Ref Range   SARS Coronavirus 2 by RT PCR NEGATIVE NEGATIVE   Influenza A by PCR NEGATIVE NEGATIVE   Influenza B by PCR NEGATIVE NEGATIVE   Resp Syncytial Virus by PCR POSITIVE (A) NEGATIVE  CBC with Differential  Result Value Ref Range   WBC 4.7 4.0 - 10.5 K/uL   RBC 4.26 3.87 - 5.11 MIL/uL   Hemoglobin 13.3 12.0 - 15.0 g/dL   HCT 42.3 36.0 - 46.0 %   MCV 99.3 80.0 - 100.0 fL   MCH 31.2 26.0 - 34.0 pg   MCHC 31.4 30.0 - 36.0 g/dL   RDW 14.0 11.5 - 15.5 %   Platelets 179 150 - 400 K/uL   nRBC 0.0 0.0 - 0.2 %   Neutrophils Relative % 62 %   Neutro Abs 2.9 1.7 - 7.7 K/uL   Lymphocytes Relative 16 %   Lymphs Abs 0.7 0.7 - 4.0 K/uL   Monocytes Relative 16 %   Monocytes Absolute 0.8 0.1 - 1.0 K/uL   Eosinophils Relative 5 %   Eosinophils Absolute 0.2 0.0 - 0.5 K/uL   Basophils Relative 1 %   Basophils Absolute 0.0 0.0 - 0.1 K/uL   Immature Granulocytes  0 %   Abs Immature Granulocytes 0.02 0.00 - 0.07 K/uL  Comprehensive metabolic panel  Result Value Ref Range   Sodium 140 135 - 145 mmol/L   Potassium 4.5 3.5 - 5.1 mmol/L   Chloride 103 98 - 111 mmol/L   CO2  29 22 - 32 mmol/L   Glucose, Bld 107 (H) 70 - 99 mg/dL   BUN 16 8 - 23 mg/dL   Creatinine, Ser 0.76 0.44 - 1.00 mg/dL   Calcium 8.7 (L) 8.9 - 10.3 mg/dL   Total Protein 6.6 6.5 - 8.1 g/dL   Albumin 3.6 3.5 - 5.0 g/dL   AST 34 15 - 41 U/L   ALT 21 0 - 44 U/L   Alkaline Phosphatase 89 38 - 126 U/L   Total Bilirubin 0.8 0.3 - 1.2 mg/dL   GFR, Estimated >60 >60 mL/min   Anion gap 8 5 - 15       Pertinent labs & imaging results that were available during my care of the patient were reviewed by me and considered in my medical decision making.  Assessment & Plan:  Blayke was seen today for er follow up.  Diagnoses and all orders for this visit:  RSV (respiratory syncytial virus infection) Diagnosed in ED 11/21/2022.  -     benzonatate (TESSALON) 100 MG capsule; Take 1 capsule (100 mg total) by mouth every 8 (eight) hours.  URI with cough and congestion Rhonchi Wheezing  Suspected pneumonia after recent viral infection. Continued cough and worsening fatigue. Denies shortness of breath or decreased urine output. Will initiate below. Pt and daughter aware of red flags which require emergent evaluation and treatment/ED visit. Follow up in 1-2 weeks for reevaluation.  -     benzonatate (TESSALON) 100 MG capsule; Take 1 capsule (100 mg total) by mouth every 8 (eight) hours. -     amoxicillin-clavulanate (AUGMENTIN) 875-125 MG tablet; Take 1 tablet by mouth 2 (two) times daily for 7 days. -     predniSONE (DELTASONE) 20 MG tablet; Take 2 tablets (40 mg total) by mouth daily with breakfast for 5 days. 2 po daily for 5 days -     azithromycin (ZITHROMAX Z-PAK) 250 MG tablet; As directed     Continue all other maintenance medications.  Follow up plan: Return in about 2 weeks (around 12/13/2022), or if symptoms worsen or fail to improve, for CAP.   Continue healthy lifestyle choices, including diet (rich in fruits, vegetables, and lean proteins, and low in salt and simple carbohydrates) and  exercise (at least 30 minutes of moderate physical activity daily).  Educational handout given for CAP  The above assessment and management plan was discussed with the patient. The patient verbalized understanding of and has agreed to the management plan. Patient is aware to call the clinic if they develop any new symptoms or if symptoms persist or worsen. Patient is aware when to return to the clinic for a follow-up visit. Patient educated on when it is appropriate to go to the emergency department.   Monia Pouch, FNP-C [facility] [phone]

## 2022-11-30 ENCOUNTER — Ambulatory Visit: Payer: Medicare Other | Admitting: Family Medicine

## 2022-12-04 ENCOUNTER — Other Ambulatory Visit: Payer: Self-pay | Admitting: Family Medicine

## 2022-12-13 ENCOUNTER — Ambulatory Visit (INDEPENDENT_AMBULATORY_CARE_PROVIDER_SITE_OTHER): Payer: Medicare Other | Admitting: Family Medicine

## 2022-12-13 ENCOUNTER — Encounter: Payer: Self-pay | Admitting: Family Medicine

## 2022-12-13 ENCOUNTER — Ambulatory Visit (INDEPENDENT_AMBULATORY_CARE_PROVIDER_SITE_OTHER): Payer: Medicare Other

## 2022-12-13 VITALS — BP 147/67 | HR 81 | Temp 97.4°F | Ht 59.0 in | Wt 134.0 lb

## 2022-12-13 DIAGNOSIS — B338 Other specified viral diseases: Secondary | ICD-10-CM | POA: Diagnosis not present

## 2022-12-13 DIAGNOSIS — R062 Wheezing: Secondary | ICD-10-CM

## 2022-12-13 DIAGNOSIS — J069 Acute upper respiratory infection, unspecified: Secondary | ICD-10-CM

## 2022-12-13 DIAGNOSIS — K449 Diaphragmatic hernia without obstruction or gangrene: Secondary | ICD-10-CM | POA: Diagnosis not present

## 2022-12-13 DIAGNOSIS — R059 Cough, unspecified: Secondary | ICD-10-CM | POA: Diagnosis not present

## 2022-12-13 DIAGNOSIS — R0989 Other specified symptoms and signs involving the circulatory and respiratory systems: Secondary | ICD-10-CM | POA: Diagnosis not present

## 2022-12-13 MED ORDER — GUAIFENESIN ER 600 MG PO TB12: 600.0000 mg | ORAL_TABLET | Freq: Two times a day (BID) | ORAL | 0 refills | Status: DC | PRN

## 2022-12-13 NOTE — Progress Notes (Signed)
Subjective:  Patient ID: Samantha Woods, female    DOB: Feb 14, 1921, 89 y.o.   MRN: [id number]  Patient Care Team: Janora Norlander, DO as PCP - General (Family Medicine) Cameron Sprang, MD as Consulting Physician (Neurology)   Chief Complaint:  Pneumonia (CAP- 2 week follow up )   HPI: Samantha Woods is a 89 y.o. female presenting on 12/13/2022 for Pneumonia (CAP- 2 week follow up )   Pt presents today for follow up. She was seen in the ED 11/21/2022 and diagnosed with RSV. She was seen in office 11/29/2022 for worsening symptoms. She was treated for suspected pneumonia with augmentin and azithromycin, tessalon and prednisone. She has completed therapy and reports feeling much better. She does still have some fatigue and weakness but this is improving daily. She is eating and drinking normally. Voiding normally.      Relevant past medical, surgical, family, and social history reviewed and updated as indicated.  Allergies and medications reviewed and updated. Data reviewed: Chart in Epic.   Past Medical History:  Diagnosis Date   GERD (gastroesophageal reflux disease)    Hypertension    Seizure (Wood)    Thyroid disease     Past Surgical History:  Procedure Laterality Date   ABDOMINAL HYSTERECTOMY     GALLBLADDER SURGERY      Social History   Socioeconomic History   Marital status: Widowed    Spouse name: Not on file   Number of children: Not on file   Years of education: Not on file   Highest education level: Not on file  Occupational History   Not on file  Tobacco Use   Smoking status: Never   Smokeless tobacco: Never  Vaping Use   Vaping Use: Never used  Substance and Sexual Activity   Alcohol use: Yes    Alcohol/week: 0.0 standard drinks of alcohol    Comment: occasional   Drug use: No   Sexual activity: Not on file  Other Topics Concern   Not on file  Social History Narrative   Pt lives in a 1 story home with her son and his wife one level    Has 3 adult children   Programmer, systems   Retired from [employer]    Caffeine 4 cups daily   Social Determinants of Health   Financial Resource Strain: Not on file  Food Insecurity: Not on file  Transportation Needs: Not on file  Physical Activity: Not on file  Stress: Not on file  Social Connections: Not on file  Intimate Partner Violence: Not on file    Outpatient Encounter Medications as of 12/13/2022  Medication Sig   acetaminophen (TYLENOL) 325 MG tablet TAKE 2 TABLETS BY MOUTH TWICE DAILY. (Patient taking differently: Take 650 mg by mouth in the morning and at bedtime.)   cetirizine (ZYRTEC) 10 MG tablet TAKE (1/2) TABLET BY MOUTH ONCE DAILY. (Patient taking differently: Take 5 mg by mouth daily. TAKE (1/2) TABLET BY MOUTH ONCE DAILY.)   clotrimazole (LOTRIMIN) 1 % cream APPLY 1 APPLICATION TO THE AFFECTED AREA ON THE GROIN FOR RASH 2 TIMES DAILY FOR 2 WEEKS   diclofenac Sodium (GOODSENSE ARTHRITIS PAIN) 1 % GEL APPLY 2 GRAMS TOPICALLY 4 TIMES DAILY   dicyclomine (BENTYL) 20 MG tablet Take 1 tablet (20 mg total) by mouth 2 (two) times daily as needed for up to 14 doses for spasms. Abdominal cramping   docusate sodium (COLACE) 100 MG capsule TAKE (1) CAPSULE BY MOUTH  TWICE DAILY AS NEEDED FOR MILD CONSTIPATION. (Patient taking differently: Take 100 mg by mouth 2 (two) times daily as needed for mild constipation.)   fluticasone (FLONASE) 50 MCG/ACT nasal spray USE 1 SPRAY IN EACH NOSTRIL DAILY AS NEEDED FOR SEASONAL ALLERGIES & RHINITIS (Patient taking differently: Place 1 spray into both nostrils daily as needed for allergies.)   furosemide (LASIX) 20 MG tablet TAKE (1/2) TABLET BY MOUTH ONCE DAILY. (Patient taking differently: Take 10 mg by mouth daily.)   guaiFENesin (MUCINEX) 600 MG 12 hr tablet Take 1 tablet (600 mg total) by mouth 2 (two) times daily as needed.   levETIRAcetam (KEPPRA) 500 MG tablet Take 1 tablet (500 mg total) by mouth 2 (two) times daily.    levothyroxine (SYNTHROID) 75 MCG tablet TAKE (1) TABLET BY MOUTH ONCE DAILY BEFORE BREAKFAST. (Patient taking differently: Take 75 mcg by mouth daily before breakfast.)   loratadine (ALLERGY RELIEF) 10 MG tablet TAKE 1 TABLET BY MOUTH ONCE DAILY.   magnesium oxide (MAG-OX) 400 (240 Mg) MG tablet TAKE (1/2) TABLET (='200MG'$ ) BY MOUTH TWICE DAILY.   Menthol-Methyl Salicylate (MUSCLE RUB) 10-15 % CREA Apply 1 application topically 2 (two) times daily as needed for muscle pain.   Multiple Vitamins-Minerals (MULTIVITAMIN) tablet TAKE (1) TABLET BY MOUTH ONCE DAILY. (Patient taking differently: Take 1 tablet by mouth daily.)   omeprazole (PRILOSEC) 20 MG capsule TAKE 1 CAPSULE BY MOUTH ONCE A DAY. (Patient taking differently: Take 20 mg by mouth daily.)   Plecanatide (TRULANCE) 3 MG TABS Take 1 tablet by mouth daily. For constipation   saccharomyces boulardii (FLORASTOR) 250 MG capsule TAKE 1 CAPSULE BY MOUTH TWICE DAILY.   celecoxib (CELEBREX) 100 MG capsule TAKE 1 CAPSULE BY MOUTH TWICE DAILY. (Patient not taking: Reported on 12/13/2022)   [DISCONTINUED] azithromycin (ZITHROMAX Z-PAK) 250 MG tablet As directed   [DISCONTINUED] benzonatate (TESSALON) 100 MG capsule Take 1 capsule (100 mg total) by mouth every 8 (eight) hours.   No facility-administered encounter medications on file as of 12/13/2022.    Allergies  Allergen Reactions   Sulfa Antibiotics Shortness Of Breath   Lorazepam Other (See Comments)    Restless, "fidgety"   Other    Darvon [Propoxyphene] Hives    Review of Systems  Constitutional:  Positive for fatigue. Negative for activity change, chills, diaphoresis and unexpected weight change.  HENT: Negative.    Eyes: Negative.  Negative for photophobia and visual disturbance.  Respiratory:  Negative for chest tightness.   Cardiovascular:  Negative for palpitations and leg swelling.  Gastrointestinal:  Negative for abdominal pain, blood in stool, constipation, diarrhea, nausea and  vomiting.  Endocrine: Negative.  Negative for polydipsia, polyphagia and polyuria.  Genitourinary:  Negative for decreased urine volume, difficulty urinating, dysuria, frequency and urgency.  Musculoskeletal:  Negative for arthralgias.  Skin: Negative.   Allergic/Immunologic: Negative.   Neurological:  Positive for weakness (generalized, improving daily). Negative for dizziness, tremors, seizures, syncope, facial asymmetry, speech difficulty, light-headedness and numbness.  Hematological: Negative.   Psychiatric/Behavioral:  Negative for confusion, hallucinations, sleep disturbance and suicidal ideas.   All other systems reviewed and are negative.       Objective:  BP (!) 147/67   Pulse 81   Temp (!) 97.4 F (36.3 C) (Temporal)   Ht '4\' 11"'$  (1.499 m)   Wt 134 lb (60.8 kg)   SpO2 96%   BMI 27.06 kg/m    Wt Readings from Last 3 Encounters:  12/13/22 134 lb (60.8 kg)  11/29/22 133  lb 6.4 oz (60.5 kg)  11/21/22 135 lb (61.2 kg)    Physical Exam Vitals and nursing note reviewed.  Constitutional:      General: She is not in acute distress.    Appearance: She is not ill-appearing, toxic-appearing or diaphoretic.     Comments: Frail elderly  HENT:     Head: Normocephalic and atraumatic.     Right Ear: Decreased hearing noted.     Left Ear: Decreased hearing noted.     Ears:     Comments: Bilateral hearing aids Eyes:     Pupils: Pupils are equal, round, and reactive to light.  Cardiovascular:     Rate and Rhythm: Normal rate and regular rhythm.     Heart sounds: Normal heart sounds.  Pulmonary:     Effort: Pulmonary effort is normal.     Breath sounds: Normal breath sounds. No wheezing, rhonchi or rales.  Skin:    General: Skin is warm and dry.     Capillary Refill: Capillary refill takes less than 2 seconds.  Neurological:     General: No focal deficit present.     Mental Status: She is alert and oriented to person, place, and time.     Gait: Gait abnormal (using  cane).  Psychiatric:        Mood and Affect: Mood normal.        Behavior: Behavior normal.        Thought Content: Thought content normal.        Judgment: Judgment normal.     Results for orders placed or performed during the hospital encounter of 11/21/22  Resp panel by RT-PCR (RSV, Flu A&B, Covid) Anterior Nasal Swab   Specimen: Anterior Nasal Swab  Result Value Ref Range   SARS Coronavirus 2 by RT PCR NEGATIVE NEGATIVE   Influenza A by PCR NEGATIVE NEGATIVE   Influenza B by PCR NEGATIVE NEGATIVE   Resp Syncytial Virus by PCR POSITIVE (A) NEGATIVE  CBC with Differential  Result Value Ref Range   WBC 4.7 4.0 - 10.5 K/uL   RBC 4.26 3.87 - 5.11 MIL/uL   Hemoglobin 13.3 12.0 - 15.0 g/dL   HCT 42.3 36.0 - 46.0 %   MCV 99.3 80.0 - 100.0 fL   MCH 31.2 26.0 - 34.0 pg   MCHC 31.4 30.0 - 36.0 g/dL   RDW 14.0 11.5 - 15.5 %   Platelets 179 150 - 400 K/uL   nRBC 0.0 0.0 - 0.2 %   Neutrophils Relative % 62 %   Neutro Abs 2.9 1.7 - 7.7 K/uL   Lymphocytes Relative 16 %   Lymphs Abs 0.7 0.7 - 4.0 K/uL   Monocytes Relative 16 %   Monocytes Absolute 0.8 0.1 - 1.0 K/uL   Eosinophils Relative 5 %   Eosinophils Absolute 0.2 0.0 - 0.5 K/uL   Basophils Relative 1 %   Basophils Absolute 0.0 0.0 - 0.1 K/uL   Immature Granulocytes 0 %   Abs Immature Granulocytes 0.02 0.00 - 0.07 K/uL  Comprehensive metabolic panel  Result Value Ref Range   Sodium 140 135 - 145 mmol/L   Potassium 4.5 3.5 - 5.1 mmol/L   Chloride 103 98 - 111 mmol/L   CO2 29 22 - 32 mmol/L   Glucose, Bld 107 (H) 70 - 99 mg/dL   BUN 16 8 - 23 mg/dL   Creatinine, Ser 0.76 0.44 - 1.00 mg/dL   Calcium 8.7 (L) 8.9 - 10.3 mg/dL   Total Protein  6.6 6.5 - 8.1 g/dL   Albumin 3.6 3.5 - 5.0 g/dL   AST 34 15 - 41 U/L   ALT 21 0 - 44 U/L   Alkaline Phosphatase 89 38 - 126 U/L   Total Bilirubin 0.8 0.3 - 1.2 mg/dL   GFR, Estimated >60 >60 mL/min   Anion gap 8 5 - 15     X-Ray: CXR: significant improvement from ED CXR. Preliminary  x-ray reading by Monia Pouch, FNP-C, [facility].   Pertinent labs & imaging results that were available during my care of the patient were reviewed by me and considered in my medical decision making.  Assessment & Plan:  Ethelyn was seen today for pneumonia.  Diagnoses and all orders for this visit:  RSV (respiratory syncytial virus infection) Rhonchi Wheezing  URI with cough and congestion Pt was treated for suspected pneumonia post RSV infection. She has completed therapy and is doing much better. Repeat CXR today with great improvement. Still has slight congestion at times. Will treat with Mucinex as needed. Pt aware to drink plenty of water.  -     DG Chest 2 View; Future -     guaiFENesin (MUCINEX) 600 MG 12 hr tablet; Take 1 tablet (600 mg total) by mouth 2 (two) times daily as needed.  Pt wished a happy 89th birthday ([birth year]).  Continue all other maintenance medications.  Follow up plan: Return if symptoms worsen or fail to improve.    The above assessment and management plan was discussed with the patient. The patient verbalized understanding of and has agreed to the management plan. Patient is aware to call the clinic if they develop any new symptoms or if symptoms persist or worsen. Patient is aware when to return to the clinic for a follow-up visit. Patient educated on when it is appropriate to go to the emergency department.   Monia Pouch, FNP-C [facility] [phone]

## 2022-12-31 ENCOUNTER — Other Ambulatory Visit: Payer: Self-pay | Admitting: Family Medicine

## 2023-01-29 ENCOUNTER — Other Ambulatory Visit: Payer: Self-pay | Admitting: Family Medicine

## 2023-01-31 ENCOUNTER — Other Ambulatory Visit: Payer: Self-pay | Admitting: Family Medicine

## 2023-01-31 DIAGNOSIS — J019 Acute sinusitis, unspecified: Secondary | ICD-10-CM

## 2023-02-19 ENCOUNTER — Encounter: Payer: Self-pay | Admitting: Family Medicine

## 2023-02-19 ENCOUNTER — Other Ambulatory Visit: Payer: Self-pay | Admitting: Family Medicine

## 2023-03-04 ENCOUNTER — Ambulatory Visit: Payer: Medicare Other | Admitting: Podiatry

## 2023-03-05 ENCOUNTER — Telehealth: Payer: Self-pay | Admitting: Family Medicine

## 2023-03-05 ENCOUNTER — Encounter: Payer: Self-pay | Admitting: Family Medicine

## 2023-03-05 NOTE — Telephone Encounter (Signed)
Daughter wants to know if pt has to be fasting when she comes in for her upcoming appt.

## 2023-03-08 ENCOUNTER — Encounter: Payer: Self-pay | Admitting: Family Medicine

## 2023-03-08 ENCOUNTER — Ambulatory Visit (INDEPENDENT_AMBULATORY_CARE_PROVIDER_SITE_OTHER): Payer: Medicare Other | Admitting: Family Medicine

## 2023-03-08 VITALS — BP 120/65 | HR 69 | Temp 98.6°F | Ht 59.0 in | Wt 136.0 lb

## 2023-03-08 DIAGNOSIS — K5909 Other constipation: Secondary | ICD-10-CM | POA: Diagnosis not present

## 2023-03-08 DIAGNOSIS — Z23 Encounter for immunization: Secondary | ICD-10-CM

## 2023-03-08 DIAGNOSIS — M159 Polyosteoarthritis, unspecified: Secondary | ICD-10-CM

## 2023-03-08 DIAGNOSIS — J3489 Other specified disorders of nose and nasal sinuses: Secondary | ICD-10-CM

## 2023-03-08 DIAGNOSIS — E039 Hypothyroidism, unspecified: Secondary | ICD-10-CM

## 2023-03-08 DIAGNOSIS — Z0001 Encounter for general adult medical examination with abnormal findings: Secondary | ICD-10-CM | POA: Diagnosis not present

## 2023-03-08 DIAGNOSIS — Z7409 Other reduced mobility: Secondary | ICD-10-CM | POA: Diagnosis not present

## 2023-03-08 DIAGNOSIS — G40909 Epilepsy, unspecified, not intractable, without status epilepticus: Secondary | ICD-10-CM

## 2023-03-08 DIAGNOSIS — H6120 Impacted cerumen, unspecified ear: Secondary | ICD-10-CM

## 2023-03-08 DIAGNOSIS — M79605 Pain in left leg: Secondary | ICD-10-CM | POA: Diagnosis not present

## 2023-03-08 DIAGNOSIS — Z0000 Encounter for general adult medical examination without abnormal findings: Secondary | ICD-10-CM

## 2023-03-08 DIAGNOSIS — I10 Essential (primary) hypertension: Secondary | ICD-10-CM | POA: Diagnosis not present

## 2023-03-08 DIAGNOSIS — M79604 Pain in right leg: Secondary | ICD-10-CM | POA: Diagnosis not present

## 2023-03-08 DIAGNOSIS — L22 Diaper dermatitis: Secondary | ICD-10-CM

## 2023-03-08 DIAGNOSIS — Z789 Other specified health status: Secondary | ICD-10-CM

## 2023-03-08 MED ORDER — AZELASTINE HCL 0.1 % NA SOLN: 1.0000 | Freq: Two times a day (BID) | NASAL | 12 refills | Status: DC

## 2023-03-08 NOTE — Patient Instructions (Signed)
Alpha Lipoic Acid 600mg  daily can help with neuropathic pain. Astelin nasal spray for nasal drainage Let me know if bowel movements don't get better with the super greens Future orders in for labs.  We'll see each other back in 6 months. Unless you need me sooner. Hopefully, we'll have RSV vaccine by then and we can administer with Flu shot

## 2023-03-08 NOTE — Progress Notes (Signed)
Samantha Woods is a 89 y.o. female presents to office today for annual physical exam examination.    Concerns today include: She is brought to the office by her daughter who reports:  Right ear Reports that she will be getting a check up with ENT next week.  Wants to make sure right ear is clear.  Wears hearing aids.  Grunt Continues to "grunt" when she is relaxing.  She denies SOB/ difficulty breathing but it concerns her children.  BMs Has difficulty with BMs only in the am.  Sometimes she strains so much she has to nap afterwards because she is worn out.  Her daughter was considering adding "super greens" to her diet and wants to know if this is safe for her.  No blood in stool, nausea or vomiting reported.  Never started the Trulance.  Leg pain Reports bilateral lower leg pain that is intermittent but usually present only at night time.  She has used topicals, celebrex. Recently her daughter used a TENS like unit with good relief.  Just wants to make sure this is ok to continue  Drainage Reports post nasal drainage/ rhinorrhea.  Uses flonase.  6. Rash Reports a rash on her bottom.  Doesn't hurt or itch but she noted it when she was wiping.  Uses a panty liner for incontinence  Occupation: retired, Marital status: widowed, Substance use: none Diet: balanced, Exercise: limited due to age and mobility issues Refills needed today: none Immunizations needed: Immunization History  Administered Date(s) Administered   Fluad Quad(high Dose 65+) 08/26/2019, 09/23/2020, 09/07/2021, 09/05/2022   Influenza, High Dose Seasonal PF 09/10/2017   Influenza,inj,Quad PF,6+ Mos 09/14/2015, 09/18/2016   Moderna Sars-Covid-2 Vaccination 11/01/2020   Pneumococcal Polysaccharide-23 05/23/2020   Td 02/19/2020     Past Medical History:  Diagnosis Date   GERD (gastroesophageal reflux disease)    Hypertension    Seizure    Thyroid disease    Social History   Socioeconomic History    Marital status: Widowed    Spouse name: Not on file   Number of children: Not on file   Years of education: Not on file   Highest education level: Not on file  Occupational History   Not on file  Tobacco Use   Smoking status: Never   Smokeless tobacco: Never  Vaping Use   Vaping Use: Never used  Substance and Sexual Activity   Alcohol use: Yes    Alcohol/week: 0.0 standard drinks of alcohol    Comment: occasional   Drug use: No   Sexual activity: Not on file  Other Topics Concern   Not on file  Social History Narrative   Pt lives in a 1 story home with her son and his wife one level   Has 3 adult children   Engineer, 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    Caffeine 4 cups daily   Social Determinants of Corporate investment banker Strain: Not on file  Food Insecurity: Not on file  Transportation Needs: Not on file  Physical Activity: Not on file  Stress: Not on file  Social Connections: Not on file  Intimate Partner Violence: Not on file   Past Surgical History:  Procedure Laterality Date   ABDOMINAL HYSTERECTOMY     GALLBLADDER SURGERY     Family History  Problem Relation Age of Onset   Heart disease Mother     Current Outpatient Medications:    acetaminophen (TYLENOL) 325 MG tablet, TAKE 2 TABLETS  BY MOUTH TWICE DAILY., Disp: 120 tablet, Rfl: 0   celecoxib (CELEBREX) 100 MG capsule, TAKE 1 CAPSULE BY MOUTH TWICE DAILY., Disp: 56 capsule, Rfl: 11   cetirizine (ZYRTEC) 10 MG tablet, TAKE (1/2) TABLET BY MOUTH ONCE DAILY. (Patient taking differently: Take 5 mg by mouth daily. TAKE (1/2) TABLET BY MOUTH ONCE DAILY.), Disp: 14 tablet, Rfl: 11   clotrimazole (LOTRIMIN) 1 % cream, APPLY 1 APPLICATION TO THE AFFECTED AREA ON THE GROIN FOR RASH 2 TIMES DAILY FOR 2 WEEKS, Disp: 30 g, Rfl: 2   diclofenac Sodium (GOODSENSE ARTHRITIS PAIN) 1 % GEL, APPLY 2 GRAMS TOPICALLY 4 TIMES DAILY, Disp: 200 g, Rfl: 2   dicyclomine (BENTYL) 20 MG tablet, Take 1 tablet (20 mg  total) by mouth 2 (two) times daily as needed for up to 14 doses for spasms. Abdominal cramping, Disp: 14 tablet, Rfl: 0   docusate sodium (COLACE) 100 MG capsule, TAKE (1) CAPSULE BY MOUTH TWICE DAILY AS NEEDED FOR MILD CONSTIPATION. (Patient taking differently: Take 100 mg by mouth 2 (two) times daily as needed for mild constipation.), Disp: 60 capsule, Rfl: 12   fluticasone (FLONASE) 50 MCG/ACT nasal spray, USE 1 SPRAY IN EACH NOSTRIL DAILY AS NEEDED FOR SEASONAL ALLERGIES & RHINITIS, Disp: 16 g, Rfl: 5   furosemide (LASIX) 20 MG tablet, TAKE (1/2) TABLET BY MOUTH ONCE DAILY. (Patient taking differently: Take 10 mg by mouth daily.), Disp: 14 tablet, Rfl: 11   guaiFENesin (MUCINEX) 600 MG 12 hr tablet, Take 1 tablet (600 mg total) by mouth 2 (two) times daily as needed., Disp: 20 tablet, Rfl: 0   levETIRAcetam (KEPPRA) 500 MG tablet, Take 1 tablet (500 mg total) by mouth 2 (two) times daily., Disp: 180 tablet, Rfl: 3   levothyroxine (SYNTHROID) 75 MCG tablet, TAKE (1) TABLET BY MOUTH ONCE DAILY BEFORE BREAKFAST. (Patient taking differently: Take 75 mcg by mouth daily before breakfast.), Disp: 30 tablet, Rfl: 11   loratadine (ALLERGY RELIEF) 10 MG tablet, TAKE 1 TABLET BY MOUTH ONCE DAILY., Disp: 30 tablet, Rfl: 4   magnesium oxide (MAG-OX) 400 (240 Mg) MG tablet, TAKE (1/2) TABLET (=200MG ) BY MOUTH TWICE DAILY., Disp: 30 tablet, Rfl: 4   Menthol-Methyl Salicylate (MUSCLE RUB) 10-15 % CREA, Apply 1 application topically 2 (two) times daily as needed for muscle pain., Disp: , Rfl: 0   Multiple Vitamins-Minerals (MULTIVITAMIN) tablet, TAKE (1) TABLET BY MOUTH ONCE DAILY. (Patient taking differently: Take 1 tablet by mouth daily.), Disp: 30 tablet, Rfl: 5   omeprazole (PRILOSEC) 20 MG capsule, TAKE 1 CAPSULE BY MOUTH ONCE A DAY. (Patient taking differently: Take 20 mg by mouth daily.), Disp: 28 capsule, Rfl: 11   Plecanatide (TRULANCE) 3 MG TABS, Take 1 tablet by mouth daily. For constipation, Disp: 30  tablet, Rfl: 0   saccharomyces boulardii (FLORASTOR) 250 MG capsule, Take 1 capsule (250 mg total) by mouth 2 (two) times daily. (NEEDS TO BE SEEN BEFORE NEXT REFILL), Disp: 56 capsule, Rfl: 0  Allergies  Allergen Reactions   Sulfa Antibiotics Shortness Of Breath   Lorazepam Other (See Comments)    Restless, "fidgety"   Other    Darvon [Propoxyphene] Hives     ROS: Review of Systems Pertinent items noted in HPI and remainder of comprehensive ROS otherwise negative.    Physical exam BP 120/65   Pulse 69   Temp 98.6 F (37 C)   Ht 4\' 11"  (1.499 m)   Wt 136 lb (61.7 kg)   SpO2 95%  BMI 27.47 kg/m  General appearance: alert, cooperative, appears stated age, and no distress Head: Normocephalic, without obvious abnormality, atraumatic Eyes: negative findings: lids and lashes normal, conjunctivae and sclerae normal, corneas clear, and pupils equal, round, reactive to light and accomodation Ears:  L TM with moderate cerumen. R TM in tact with dulled light reflex Nose:  clear rhinorrhea present. Deviated septum noted Throat:  oropharynx without exudates or erythema.   Neck: no adenopathy and supple, symmetrical, trachea midline Back:  increased kyphosis of the thoracic spine present Lungs: clear to auscultation bilaterally Heart: regular rate and rhythm, S1, S2 normal, no murmur, click, rub or gallop Abdomen: soft, non-tender; bowel sounds normal; no masses,  no organomegaly Pelvic:  hyperemia and irritation appreciated between the gluteal folds. No ulceration/ bleeding or exudates Extremities: extremities normal, atraumatic, no cyanosis or edema Pulses: 2+ and symmetric Skin:  solar lentigo present. Senile purpura present Lymph nodes: Cervical, supraclavicular, and axillary nodes normal. Neurologic: Grossly normal except difficulty hearing. Psych: very engaging, pleasant and humorous lady     03/08/2023    2:46 PM 12/13/2022    3:28 PM 11/06/2022    1:59 PM  Depression screen  PHQ 2/9  Decreased Interest 0 0 0  Down, Depressed, Hopeless 0 0 0  PHQ - 2 Score 0 0 0  Altered sleeping 0 1   Tired, decreased energy 0 1   Change in appetite 0 0   Feeling bad or failure about yourself  0 0   Trouble concentrating 0 0   Moving slowly or fidgety/restless 0 0   Suicidal thoughts 0 0   PHQ-9 Score 0 2   Difficult doing work/chores Not difficult at all Not difficult at all       03/08/2023    2:46 PM 12/13/2022    3:28 PM 09/05/2022    3:00 PM 03/06/2022    4:25 PM  GAD 7 : Generalized Anxiety Score  Nervous, Anxious, on Edge 0 0 0 0  Control/stop worrying 0 0 0 0  Worry too much - different things 0 0 0 0  Trouble relaxing 0 0 0 0  Restless 0 0 0 0  Easily annoyed or irritable 0 0 0 0  Afraid - awful might happen 0 0 0 0  Total GAD 7 Score 0 0 0 0  Anxiety Difficulty Not difficult at all Not difficult at all Not difficult at all Not difficult at all     Assessment/ Plan: Noralee Stain here for annual physical exam.   Annual physical exam  Encounter for vaccination - Plan: Pneumococcal conjugate vaccine 20-valent (Prevnar 20)  Acquired hypothyroidism - Plan: TSH, T4, free  Impaired mobility and ADLs  Seizure disorder - Plan: CMP14+EGFR, CBC  Primary osteoarthritis involving multiple joints  Essential hypertension - Plan: CMP14+EGFR  Cerumen in auditory canal on examination  Pain in both lower extremities  Rhinorrhea - Plan: azelastine (ASTELIN) 0.1 % nasal spray  Diaper dermatitis  Chronic constipation  PNA vaccine administered given RSV PNA earlier this year.  Agree with RSV vaccine in the fall. Discussed risk of AFib but I think benefit outweighs risk for this centurion.   Check thyroid levels/ CMP/ CBC at next visit. Recent labs were normal so did not repeat today.  Continue Celebrex prn.  Ok to add Alphalipoic acid 600mg  nightly for leg pain.  BP controlled. No changes  Cerumen on left irrigated successfully.  Astelin added for  rhinorrhea.  Use barrier cream and allow airing out  of genitals as able.  Ok to add super greens supplement.  Encourage PO hydration. Activity as tolerated.  May need to revisit Trulance or similar.  Follow up in 42m.  Counseled on healthy lifestyle choices, including diet (rich in fruits, vegetables and lean meats and low in salt and simple carbohydrates) and exercise (at least 30 minutes of moderate physical activity daily).   Annmarie Plemmons M. Nadine Counts, DO

## 2023-03-18 ENCOUNTER — Encounter: Payer: Self-pay | Admitting: Podiatry

## 2023-03-18 ENCOUNTER — Ambulatory Visit: Payer: Medicare Other | Admitting: Podiatry

## 2023-03-18 DIAGNOSIS — B351 Tinea unguium: Secondary | ICD-10-CM

## 2023-03-18 DIAGNOSIS — M79674 Pain in right toe(s): Secondary | ICD-10-CM | POA: Diagnosis not present

## 2023-03-18 DIAGNOSIS — M79675 Pain in left toe(s): Secondary | ICD-10-CM

## 2023-03-18 NOTE — Progress Notes (Signed)
This patient returns to the office for evaluation and treatment of long thick painful nails .  This patient is unable to trim her own nails since the patient cannot reach her feet.  Patient says the nails are painful walking and wearing her shoes.  She returns for preventive foot care services.  She is accompanied by female caregiver.  General Appearance  Alert, conversant and in no acute stress.  Vascular  Dorsalis pedis and posterior tibial  pulses are   weakly  palpable  bilaterally.  Capillary return is within normal limits  bilaterally. Cold feet. bilaterally.  Neurologic  Senn-Weinstein monofilament wire test within normal limits  bilaterally. Muscle power within normal limits bilaterally.  Nails Thick disfigured discolored nails with subungual debris  from hallux to fifth toes bilaterally. No evidence of bacterial infection or drainage bilaterally.  Orthopedic  No limitations of motion  feet .  No crepitus or effusions noted.  No bony pathology or digital deformities noted.  Skin  normotropic skin with no porokeratosis noted bilaterally.  No signs of infections or ulcers noted.     Onychomycosis  Pain in toes right foot  Pain in toes left foot  Debridement  of nails  1-5  B/L with a nail nipper.  Nails were then filed using a dremel tool with no incidents.  RTC 4  months    Sian Rockers DPM  

## 2023-03-21 ENCOUNTER — Other Ambulatory Visit: Payer: Self-pay | Admitting: Family Medicine

## 2023-04-01 ENCOUNTER — Other Ambulatory Visit: Payer: Self-pay | Admitting: Family Medicine

## 2023-04-01 DIAGNOSIS — I10 Essential (primary) hypertension: Secondary | ICD-10-CM

## 2023-04-04 DIAGNOSIS — Z1283 Encounter for screening for malignant neoplasm of skin: Secondary | ICD-10-CM | POA: Diagnosis not present

## 2023-04-04 DIAGNOSIS — D225 Melanocytic nevi of trunk: Secondary | ICD-10-CM | POA: Diagnosis not present

## 2023-04-04 DIAGNOSIS — L8990 Pressure ulcer of unspecified site, unspecified stage: Secondary | ICD-10-CM | POA: Diagnosis not present

## 2023-04-12 ENCOUNTER — Encounter: Payer: Self-pay | Admitting: Family Medicine

## 2023-04-12 DIAGNOSIS — M159 Polyosteoarthritis, unspecified: Secondary | ICD-10-CM

## 2023-04-12 DIAGNOSIS — Z7409 Other reduced mobility: Secondary | ICD-10-CM

## 2023-04-17 ENCOUNTER — Other Ambulatory Visit: Payer: Self-pay | Admitting: Family Medicine

## 2023-04-29 ENCOUNTER — Encounter: Payer: Self-pay | Admitting: Family Medicine

## 2023-04-29 ENCOUNTER — Telehealth: Payer: Self-pay | Admitting: Family Medicine

## 2023-04-29 DIAGNOSIS — I10 Essential (primary) hypertension: Secondary | ICD-10-CM

## 2023-04-29 DIAGNOSIS — K5909 Other constipation: Secondary | ICD-10-CM

## 2023-04-29 MED ORDER — THERA VITAL M PO TABS: 1.0000 | ORAL_TABLET | Freq: Every day | ORAL | 5 refills | Status: DC

## 2023-04-29 MED ORDER — FUROSEMIDE 20 MG PO TABS
ORAL_TABLET | ORAL | 3 refills | Status: AC
Start: 2023-04-29 — End: ?

## 2023-04-29 MED ORDER — LEVOTHYROXINE SODIUM 75 MCG PO TABS: ORAL_TABLET | ORAL | 3 refills | Status: DC

## 2023-04-29 NOTE — Telephone Encounter (Signed)
  Prescription Request  04/29/2023  Is this a "Controlled Substance" medicine? NO  Have you seen your PCP in the last 2 weeks? NO  If YES, route message to pool  -  If NO, patient needs to be scheduled for appointment.  What is the name of the medication or equipment? Multiple Vitamins -Minerals - Patient is out and needs for her pill pack.  Have you contacted your pharmacy to request a refill? YES   Which pharmacy would you like this sent to? [facility] -   Patient notified that their request is being sent to the clinical staff for review and that they should receive a response within 2 business days.

## 2023-04-29 NOTE — Telephone Encounter (Signed)
Aware refill sent to pharmacy ?

## 2023-04-29 NOTE — Addendum Note (Signed)
Addended by: Julious Payer D on: 04/29/2023 02:54 PM   Modules accepted: Orders

## 2023-04-30 ENCOUNTER — Telehealth: Payer: Self-pay | Admitting: Family Medicine

## 2023-04-30 ENCOUNTER — Encounter: Payer: Self-pay | Admitting: Family Medicine

## 2023-04-30 ENCOUNTER — Other Ambulatory Visit: Payer: Self-pay | Admitting: Family Medicine

## 2023-04-30 DIAGNOSIS — G40909 Epilepsy, unspecified, not intractable, without status epilepticus: Secondary | ICD-10-CM

## 2023-04-30 DIAGNOSIS — Z789 Other specified health status: Secondary | ICD-10-CM | POA: Diagnosis not present

## 2023-04-30 DIAGNOSIS — Z7409 Other reduced mobility: Secondary | ICD-10-CM | POA: Diagnosis not present

## 2023-04-30 DIAGNOSIS — M159 Polyosteoarthritis, unspecified: Secondary | ICD-10-CM

## 2023-04-30 DIAGNOSIS — M79604 Pain in right leg: Secondary | ICD-10-CM

## 2023-04-30 MED ORDER — TRANSFER BENCH MISC
0 refills | Status: AC
Start: 2023-04-30 — End: ?

## 2023-04-30 NOTE — Telephone Encounter (Signed)
G took care of this , see pt chart message

## 2023-05-07 ENCOUNTER — Encounter (INDEPENDENT_AMBULATORY_CARE_PROVIDER_SITE_OTHER): Payer: Medicare Other | Admitting: Family Medicine

## 2023-05-07 DIAGNOSIS — J3489 Other specified disorders of nose and nasal sinuses: Secondary | ICD-10-CM

## 2023-05-07 DIAGNOSIS — K5909 Other constipation: Secondary | ICD-10-CM

## 2023-05-07 MED ORDER — TRULANCE 3 MG PO TABS
1.0000 | ORAL_TABLET | Freq: Every day | ORAL | 12 refills | Status: DC
Start: 2023-05-07 — End: 2023-09-26

## 2023-05-07 NOTE — Telephone Encounter (Signed)

## 2023-05-09 ENCOUNTER — Encounter: Payer: Self-pay | Admitting: Family Medicine

## 2023-06-10 ENCOUNTER — Other Ambulatory Visit: Payer: Self-pay | Admitting: Neurology

## 2023-06-20 ENCOUNTER — Other Ambulatory Visit: Payer: Self-pay | Admitting: Family Medicine

## 2023-07-03 ENCOUNTER — Ambulatory Visit (INDEPENDENT_AMBULATORY_CARE_PROVIDER_SITE_OTHER): Payer: Medicare Other | Admitting: Internal Medicine

## 2023-07-03 ENCOUNTER — Other Ambulatory Visit: Payer: Self-pay

## 2023-07-03 ENCOUNTER — Encounter: Payer: Self-pay | Admitting: Internal Medicine

## 2023-07-03 VITALS — BP 118/68 | HR 86 | Temp 98.0°F | Ht 58.27 in | Wt 134.2 lb

## 2023-07-03 DIAGNOSIS — J019 Acute sinusitis, unspecified: Secondary | ICD-10-CM | POA: Diagnosis not present

## 2023-07-03 DIAGNOSIS — J343 Hypertrophy of nasal turbinates: Secondary | ICD-10-CM | POA: Diagnosis not present

## 2023-07-03 DIAGNOSIS — R0982 Postnasal drip: Secondary | ICD-10-CM

## 2023-07-03 DIAGNOSIS — J3089 Other allergic rhinitis: Secondary | ICD-10-CM

## 2023-07-03 DIAGNOSIS — J3489 Other specified disorders of nose and nasal sinuses: Secondary | ICD-10-CM | POA: Diagnosis not present

## 2023-07-03 MED ORDER — FLUTICASONE PROPIONATE 50 MCG/ACT NA SUSP
2.0000 | Freq: Every day | NASAL | 5 refills | Status: DC
Start: 2023-07-03 — End: 2023-11-22

## 2023-07-03 MED ORDER — AZELASTINE HCL 0.1 % NA SOLN: 1.0000 | Freq: Two times a day (BID) | NASAL | 5 refills | Status: DC | PRN

## 2023-07-03 NOTE — Progress Notes (Signed)
NEW PATIENT  Date of Service/Encounter:  07/03/23  Consult requested by: Raliegh Ip, DO   Subjective:   Samantha Woods (DOB: 12/01/1920) is a 89 y.o. female who presents to the clinic on 07/03/2023 with a chief complaint of Follow-up .    History obtained from: chart review and patient and daughter .  Rhinitis:  Started a long time ago.  Symptoms include: nasal congestion, rhinorrhea, post nasal drainage, and sneezing  It is worse in the morning.   Occurs year-round Potential triggers: not sure   Treatments tried:  Flonase Azelastine PRN Saline spray Saw ENT in the past and was told it looked okay. Claritin daily  Previous allergy testing: no Nonallergic triggers: none       Past Medical History: Past Medical History:  Diagnosis Date   GERD (gastroesophageal reflux disease)    Hypertension    Seizure (HCC)    Thyroid disease     Past Surgical History: Past Surgical History:  Procedure Laterality Date   ABDOMINAL HYSTERECTOMY     GALLBLADDER SURGERY      Family History: Family History  Problem Relation Age of Onset   Heart disease Mother     Social History:  Flooring in bedroom: wood Pets: none Tobacco use/exposure: none Job: retired  Medication List:  Allergies as of 07/03/2023       Reactions   Sulfa Antibiotics Shortness Of Breath   Lorazepam Other (See Comments)   Restless, "fidgety"   Other    Darvon [propoxyphene] Hives        Medication List        Accurate as of July 03, 2023  2:45 PM. If you have any questions, ask your nurse or doctor.          acetaminophen 325 MG tablet Commonly known as: TYLENOL TAKE 2 TABLETS BY MOUTH TWICE DAILY.   Allergy Relief 10 MG tablet Generic drug: loratadine TAKE 1 TABLET BY MOUTH ONCE DAILY.   azelastine 0.1 % nasal spray Commonly known as: ASTELIN Place 1 spray into both nostrils 2 (two) times daily.   celecoxib 100 MG capsule Commonly known as: CELEBREX TAKE 1  CAPSULE BY MOUTH TWICE DAILY.   cetirizine 10 MG tablet Commonly known as: ZYRTEC TAKE (1/2) TABLET BY MOUTH ONCE DAILY. What changed:  how much to take how to take this when to take this   clotrimazole 1 % cream Commonly known as: LOTRIMIN APPLY 1 APPLICATION TO THE AFFECTED AREA ON THE GROIN FOR RASH 2 TIMES DAILY FOR 2 WEEKS   dicyclomine 20 MG tablet Commonly known as: BENTYL Take 1 tablet (20 mg total) by mouth 2 (two) times daily as needed for up to 14 doses for spasms. Abdominal cramping   docusate sodium 100 MG capsule Commonly known as: COLACE TAKE (1) CAPSULE BY MOUTH TWICE DAILY AS NEEDED FOR MILD CONSTIPATION. What changed:  how much to take how to take this when to take this reasons to take this additional instructions   Florastor 250 MG capsule Generic drug: saccharomyces boulardii TAKE 1 CAPSULE BY MOUTH TWICE DAILY.   fluticasone 50 MCG/ACT nasal spray Commonly known as: FLONASE USE 1 SPRAY IN EACH NOSTRIL DAILY AS NEEDED FOR SEASONAL ALLERGIES & RHINITIS   furosemide 20 MG tablet Commonly known as: LASIX TAKE (1/2) TABLET BY MOUTH ONCE DAILY.   GoodSense Arthritis Pain 1 % Gel Generic drug: diclofenac Sodium APPLY 2 GRAMS TOPICALLY 4 TIMES DAILY   guaiFENesin 600 MG 12 hr tablet Commonly  known as: Mucinex Take 1 tablet (600 mg total) by mouth 2 (two) times daily as needed.   levETIRAcetam 500 MG tablet Commonly known as: KEPPRA TAKE 1 TABLET BY MOUTH TWICE DAILY.   levothyroxine 75 MCG tablet Commonly known as: SYNTHROID TAKE (1) TABLET BY MOUTH ONCE DAILY BEFORE BREAKFAST.   magnesium oxide 400 (240 Mg) MG tablet Commonly known as: MAG-OX TAKE (1/2) TABLET (=200MG ) BY MOUTH TWICE DAILY.   multivitamin tablet Take 1 tablet by mouth daily.   Muscle Rub 10-15 % Crea Apply 1 application topically 2 (two) times daily as needed for muscle pain.   omeprazole 20 MG capsule Commonly known as: PRILOSEC TAKE 1 CAPSULE BY MOUTH ONCE A DAY.    Transfer Bench Misc UAD for mobility assistance in shower.   Trulance 3 MG Tabs Generic drug: Plecanatide Take 1 tablet (3 mg total) by mouth daily. For constipation         REVIEW OF SYSTEMS: Pertinent positives and negatives discussed in HPI.   Objective:   Physical Exam: BP 118/68   Pulse 86   Temp 98 F (36.7 C) (Temporal)   Ht 4' 10.27" (1.48 m)   Wt 134 lb 3.2 oz (60.9 kg)   SpO2 94%   BMI 27.79 kg/m  Body mass index is 27.79 kg/m. GEN: alert, well developed HEENT: clear conjunctiva, TM grey and translucent, nose with + inferior turbinate hypertrophy, pink nasal mucosa, slight clear rhinorrhea, no cobblestoning HEART: regular rate and rhythm, no murmur LUNGS: clear to auscultation bilaterally, no coughing, unlabored respiration ABDOMEN: soft, non distended  SKIN: no rashes or lesions  Reviewed:  03/08/2023: seen by Dr Nadine Counts DO for PND and rhinorrhea despite Flonase.  Started on Azelastine.  Referred to Allergy.   11/29/2022: seen by Rakes NP for RSV with wheezing.  Some concern for post viral PNA.  Started on prednisone, augmentin, azithromycin and tessalon perles.   11/21/2022: seen in ER for acute cough and SOB.  Discussed likely viral URI.  Tessalon PRN.   Assessment:   1. Other allergic rhinitis   2. Post-nasal drip   3. Nasal turbinate hypertrophy     Plan/Recommendations:  Other Allergic Rhinitis: - Due to turbinate hypertrophhy and unresponsive to over the counter meds, will perform skin testing to identify aeroallergen triggers.  Unable to test today due to [facility].  - Use nasal saline rinses before nose sprays such as with Neilmed Sinus Rinse.  Use distilled water.   - Use Flonase 2 sprays each nostril daily. Aim upward and outward. - Use Azelastine 1-2 sprays each nostril twice daily as needed for runny nose, drainage, sneezing, congestion. Aim upward and outward. - Use Claritin 10 mg daily.  - Stop all anti histamines 3 days prior to next visit  such as Claritin, Azelastine, Zyrtec, Allegra, Benadryl.    Follow up: 07/15/2023 at 1:30 PM okay to overbook with me for skin testing.     Return in about 12 days (around 07/15/2023).  Alesia Morin, MD [facility]

## 2023-07-03 NOTE — Patient Instructions (Addendum)
Other Allergic Rhinitis: - Use nasal saline rinses before nose sprays such as with Neilmed Sinus Rinse.  Use distilled water.   - Use Flonase 2 sprays each nostril daily. Aim upward and outward. - Use Azelastine 1-2 sprays each nostril twice daily as needed for runny nose, drainage, sneezing, congestion. Aim upward and outward. - Use Claritin 10 mg daily.  - Stop all anti histamines 3 days prior to next visit such as Claritin, Azelastine, Zyrtec, Allegra, Benadryl.    Follow up: 07/15/2023 at 1:30 PM okay to overbook with me for skin testing.

## 2023-07-04 ENCOUNTER — Other Ambulatory Visit: Payer: Self-pay | Admitting: Family Medicine

## 2023-07-05 ENCOUNTER — Ambulatory Visit: Payer: Medicare Other | Admitting: Neurology

## 2023-07-10 ENCOUNTER — Encounter: Payer: Self-pay | Admitting: Neurology

## 2023-07-10 ENCOUNTER — Ambulatory Visit: Payer: Medicare Other | Admitting: Neurology

## 2023-07-10 VITALS — BP 122/72 | HR 77 | Ht <= 58 in | Wt 134.4 lb

## 2023-07-10 DIAGNOSIS — G40009 Localization-related (focal) (partial) idiopathic epilepsy and epileptic syndromes with seizures of localized onset, not intractable, without status epilepticus: Secondary | ICD-10-CM

## 2023-07-10 MED ORDER — LEVETIRACETAM 500 MG PO TABS: 500.0000 mg | ORAL_TABLET | Freq: Two times a day (BID) | ORAL | 3 refills | Status: DC

## 2023-07-10 NOTE — Progress Notes (Signed)
NEUROLOGY FOLLOW UP OFFICE NOTE  Samantha Woods [id number] February 16, 1921  HISTORY OF PRESENT ILLNESS: I had the pleasure of seeing Samantha Woods in follow-up in the neurology clinic on 07/10/2023.  The patient was last seen a year ago for seizures. She is again accompanied by her daughter Samantha Woods who helps supplement the history today.  Records and images were personally reviewed where available. She had 2 seizures in October 2018 with unremarkable workup. She reported lethargy on Levetiracetam 500mg  BID, however after reduction in dose, she had another seizure in 2019. None since increasing back to 500mg  BID, no side effects. She denies any headaches, dizziness, focal weakness. She has occasional numbness on the last 2 digits of both hands. She is still able to crochet. She reports her feet hurt on and off, she has pain in her legs/ankles, usually at night when lying down. She gets up and puts patches or rubs Vicks, which helps. She is also on alpha lipoic acid. When climbing stairs, she has more difficulty lifting her left leg up. Around 4 months ago, her bathtub bench slipped from under her, no injuries. Family had difficulty getting her up, her son had to come and help. She denies any head pain, she states it "feels fuzzy" sometimes when she gets congested. She reports symptoms over the frontal and nasal region, at times it feels like little worms crawling on her head. When she blows her nose ("when it gets cleaned out") and mucus comes out, she feels a lot better. No improvement with nasal spray. She will be seeing an allergist. She is sleeping well. She lives with Samantha Woods. Medications come in pillpacks, she takes her own medications.    History on Initial Assessment 10/30/2017: This is a very pleasant 89 yo RH woman with a history of hypertension, hypothyroidism, with new onset seizure last 09/18/2017. She recalls she was crocheting, bent down to get something, then had no recollection of subsequent  events until she was in the hospital. On further questioning, she does not remember much of her hospital stay. Her son reports she does not recall much of being in a SNF afterwards either. Her son came home that day and found her on the floor. She was not talking for more than a day. [facility] records reviewed, she was lethargic, confused, non-verbal. CT perfusion did not show any evidence of stroke. She was started on empiric antibiotics and lumbar puncture was planned. Prior to LP being performed, she had a seizure and was given 1.5mg  Ativan. She had an MRI brain which I personally reviewed, no acute changes seen. EEG showed diffuse slowing, no epileptiform discharges seen. Her LP was overall unremarkable except for an elevated protein of 62. Urine culture positive for Klebsiella. It was felt that seizure was secondary to UTI, but at age 50, seizure predisposition is likely, and further seizures could occur with future infections, hence she was discharged on Keppra 500mg  BID.    There have been no further similar spells since October. No prior similar episodes in the past. She has been living with her son for the past 3 years, and he reports that prior to October, she had been doing pretty well, in charge of her own medications, pulling weeds in the yard. There has been a big change since return home, her son now puts her pills in a pillbox and sets a timer for her, which she does fine with. She cannot remember things for any length of time. She is still not able to  walk very far, she now has to use a walker at all times (previously only used prn cane). She did break her collarbone when she fell, but denies any pain. She feels her left side is weaker. She reports that this is chronic, she has always had to lift her left leg to get into a car. She has pain in her left knee and had an injection before her hospitalization. Her son reports she is not walking like she normally did.   She has occasional sharp pains behind  the eyes. She has some dizziness upon standing. There is occasional numbness in her fingertips and both feet. She used to crochet a lot, but not like before per son. He has to help her into the shower but she can bathe herself. She needs help lifting her left leg in and out of the shower. She is able to dress independently. She has some pain under the right side of her chest when combing her hair. She stopped driving at age 34. Her son is in charge of finances. She has urinary frequency, her son has been waking her up at 3am to use the bathroom, otherwise she would wet the bed. Since hospital discharge, she has been sleeping "all the time." Her son denies any staring/unresponsive episodes. She denies any olfactory/gustatory hallucinations, deja vu, rising epigastric sensation, myoclonic jerks.   Epilepsy Risk Factors:  Her youngest sister had a seizure while pregnant. Otherwise she had a normal birth and early development.  There is no history of febrile convulsions, CNS infections such as meningitis/encephalitis, significant traumatic brain injury, neurosurgical procedures.  PAST MEDICAL HISTORY: Past Medical History:  Diagnosis Date   GERD (gastroesophageal reflux disease)    Hypertension    Seizure (HCC)    Thyroid disease     MEDICATIONS: Current Outpatient Medications on File Prior to Visit  Medication Sig Dispense Refill   acetaminophen (TYLENOL) 325 MG tablet TAKE 2 TABLETS BY MOUTH TWICE DAILY. 120 tablet 0   azelastine (ASTELIN) 0.1 % nasal spray Place 1 spray into both nostrils 2 (two) times daily as needed for rhinitis. 30 mL 5   celecoxib (CELEBREX) 100 MG capsule TAKE 1 CAPSULE BY MOUTH TWICE DAILY. 60 capsule 1   cetirizine (ZYRTEC) 10 MG tablet TAKE (1/2) TABLET BY MOUTH ONCE DAILY. 15 tablet 1   clotrimazole (LOTRIMIN) 1 % cream APPLY 1 APPLICATION TO THE AFFECTED AREA ON THE GROIN FOR RASH 2 TIMES DAILY FOR 2 WEEKS 30 g 2   diclofenac Sodium (GOODSENSE ARTHRITIS PAIN) 1 % GEL  APPLY 2 GRAMS TOPICALLY 4 TIMES DAILY 200 g 3   dicyclomine (BENTYL) 20 MG tablet Take 1 tablet (20 mg total) by mouth 2 (two) times daily as needed for up to 14 doses for spasms. Abdominal cramping 14 tablet 0   docusate sodium (COLACE) 100 MG capsule TAKE (1) CAPSULE BY MOUTH TWICE DAILY AS NEEDED FOR MILD CONSTIPATION. 60 capsule 1   fluticasone (FLONASE) 50 MCG/ACT nasal spray Place 2 sprays into both nostrils daily. 16 g 5   furosemide (LASIX) 20 MG tablet TAKE (1/2) TABLET BY MOUTH ONCE DAILY. 45 tablet 3   guaiFENesin (MUCINEX) 600 MG 12 hr tablet Take 1 tablet (600 mg total) by mouth 2 (two) times daily as needed. 20 tablet 0   levETIRAcetam (KEPPRA) 500 MG tablet TAKE 1 TABLET BY MOUTH TWICE DAILY. 60 tablet 1   levothyroxine (SYNTHROID) 75 MCG tablet TAKE (1) TABLET BY MOUTH ONCE DAILY BEFORE BREAKFAST. 90 tablet 3  loratadine (ALLERGY RELIEF) 10 MG tablet TAKE 1 TABLET BY MOUTH ONCE DAILY. 30 tablet 5   magnesium oxide (MAG-OX) 400 (240 Mg) MG tablet TAKE (1/2) TABLET (=200MG ) BY MOUTH TWICE DAILY. 30 tablet 5   Menthol-Methyl Salicylate (MUSCLE RUB) 10-15 % CREA Apply 1 application topically 2 (two) times daily as needed for muscle pain.  0   Misc. Devices (TRANSFER BENCH) MISC UAD for mobility assistance in shower. 1 each 0   Multiple Vitamins-Minerals (MULTIVITAMIN) tablet Take 1 tablet by mouth daily. 30 tablet 5   omeprazole (PRILOSEC) 20 MG capsule TAKE 1 CAPSULE BY MOUTH ONCE A DAY. 30 capsule 5   Plecanatide (TRULANCE) 3 MG TABS Take 1 tablet (3 mg total) by mouth daily. For constipation 30 tablet 12   saccharomyces boulardii (FLORASTOR) 250 MG capsule TAKE 1 CAPSULE BY MOUTH TWICE DAILY. 60 capsule 1   No current facility-administered medications on file prior to visit.    ALLERGIES: Allergies  Allergen Reactions   Sulfa Antibiotics Shortness Of Breath   Lorazepam Other (See Comments)    Restless, "fidgety"   Other    Darvon [Propoxyphene] Hives    FAMILY  HISTORY: Family History  Problem Relation Age of Onset   Heart disease Mother     SOCIAL HISTORY: Social History   Socioeconomic History   Marital status: Widowed    Spouse name: Not on file   Number of children: Not on file   Years of education: Not on file   Highest education level: Not on file  Occupational History   Not on file  Tobacco Use   Smoking status: Never   Smokeless tobacco: Never  Vaping Use   Vaping status: Never Used  Substance and Sexual Activity   Alcohol use: Not Currently    Comment: occasional   Drug use: No   Sexual activity: Not on file  Other Topics Concern   Not on file  Social History Narrative   Pt lives in a 1 story home with her son and his wife one level   Has 3 adult children   Engineer, 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    Caffeine 4 cups daily   Social Determinants of Health   Financial Resource Strain: Not on file  Food Insecurity: Not on file  Transportation Needs: Not on file  Physical Activity: Not on file  Stress: Not on file  Social Connections: Not on file  Intimate Partner Violence: Not on file     PHYSICAL EXAM: Vitals:   07/10/23 1426  BP: 122/72  Pulse: 77  SpO2: 95%   General: No acute distress Head:  Normocephalic/atraumatic Skin/Extremities: No rash, no edema Neurological Exam: alert and awake. No aphasia or dysarthria. Fund of knowledge is appropriate.  Attention and concentration are normal.   Cranial nerves: Pupils equal, round. Extraocular movements intact with no nystagmus. Visual fields full.  No facial asymmetry.  Motor: Bulk and tone normal, muscle strength 5/5 throughout with no pronator drift. They report difficulty climbing stairs more with leg leg, however on individual muscle testing, no weakness noted. Reflexes +1 throughout.   Finger to nose testing intact.  Gait slow and cautious with cane, favoring left leg (bent at knee), no ataxia. No tremors.    IMPRESSION: This is a  pleasant 89 yo RH woman with a history of hypertension, hypothyroidism, who had a seizure in 08/2017 in the setting of a UTI. MRI brain no acute changes, EEG showed diffuse slowing. She was started  on Levetiracetam but had side effects initially on 500mg  BID, however with dose reduction, she had another seizure in 2019. She has been seizure-free back on Levetiracetam 500mg  BID since 2019, no side effects, refills sent. She is doing well and remains active. She does not drive. Continue close supervision. Follow-up in 1 year, call for any changes.   Thank you for allowing me to participate in her care.  Please do not hesitate to call for any questions or concerns.    Patrcia Dolly, M.D.   CC: Dr. Nadine Counts

## 2023-07-10 NOTE — Patient Instructions (Signed)
Always a pleasure to see you! Continue Keppra (Levetiracetam) '500mg'$  twice a day. Follow-up in 1 year, call for any changes.   Seizure Precautions: 1. If medication has been prescribed for you to prevent seizures, take it exactly as directed.  Do not stop taking the medicine without talking to your doctor first, even if you have not had a seizure in a long time.   2. Avoid activities in which a seizure would cause danger to yourself or to others.  Don't operate dangerous machinery, swim alone, or climb in high or dangerous places, such as on ladders, roofs, or girders.  Do not drive unless your doctor says you may.  3. If you have any warning that you may have a seizure, lay down in a safe place where you can't hurt yourself.    4.  No driving for 6 months from last seizure, as per [facility].   Please refer to the following link on the [place] website for more information: http://[email or web address]/[email or web address]/drivingu.cfm   5.  Maintain good sleep hygiene.  6.  Contact your doctor if you have any problems that may be related to the medicine you are taking.  7.  Call 911 and bring the patient back to the ED if:        A.  The seizure lasts longer than 5 minutes.       B.  The patient doesn't awaken shortly after the seizure  C.  The patient has new problems such as difficulty seeing, speaking or moving  D.  The patient was injured during the seizure  E.  The patient has a temperature over 102 F (39C)  F.  The patient vomited and now is having trouble breathing

## 2023-07-15 ENCOUNTER — Other Ambulatory Visit: Payer: Self-pay

## 2023-07-15 ENCOUNTER — Encounter: Payer: Self-pay | Admitting: Internal Medicine

## 2023-07-15 ENCOUNTER — Ambulatory Visit (INDEPENDENT_AMBULATORY_CARE_PROVIDER_SITE_OTHER): Payer: Medicare Other | Admitting: Internal Medicine

## 2023-07-15 VITALS — BP 110/68 | HR 71 | Resp 17

## 2023-07-15 DIAGNOSIS — J3089 Other allergic rhinitis: Secondary | ICD-10-CM | POA: Diagnosis not present

## 2023-07-15 MED ORDER — IPRATROPIUM BROMIDE 0.06 % NA SOLN: 1.0000 | Freq: Three times a day (TID) | NASAL | 5 refills | Status: DC | PRN

## 2023-07-15 NOTE — Progress Notes (Signed)
FOLLOW UP Date of Service/Encounter:  07/15/23   Subjective:  Samantha Woods (DOB: November 01, 1921) is a 89 y.o. female who [facility] for follow up for skin testing.   History obtained from: chart review and patient and daughter .  Held anti histamines.   Past Medical History: Past Medical History:  Diagnosis Date   GERD (gastroesophageal reflux disease)    Hypertension    Seizure (HCC)    Thyroid disease     Objective:  BP 110/68 (BP Location: Right Arm, Patient Position: Sitting, Cuff Size: Normal)   Pulse 71   Resp 17   SpO2 94%  There is no height or weight on file to calculate BMI. Physical Exam: GEN: alert, well developed HEENT: clear conjunctiva, MMM HEART: regular rate and rhythm, no murmur LUNGS:  no coughing, unlabored respiration SKIN: no rashes or lesions  Skin Testing:  Skin prick testing was placed, which includes aeroallergens/foods, histamine control, and saline control.  Verbal consent was obtained prior to placing test.  Patient tolerated procedure well.  Allergy testing results were read and interpreted by myself, documented by clinical staff. Adequate positive and negative control.  Positive results to:  Results discussed with patient/family.  Airborne Adult Perc - 07/15/23 1402     Time Antigen Placed 1402    Allergen Manufacturer Waynette Buttery    Location Back    Number of Test 54    1. Control-Buffer 50% Glycerol Negative    2. Control-Histamine 3+    3. Bahia Negative    4. French Southern Territories Negative    5. Johnson Negative    6. Kentucky Blue Negative    7. Meadow Fescue Negative    8. Perennial Rye Negative    9. Timothy Negative    10. Ragweed Mix Negative    11. Cocklebur Negative    12. Plantain,  English Negative    13. Baccharis Negative    14. Dog Fennel Negative    15. Russian Thistle Negative    16. Lamb's Quarters Negative    17. Sheep Sorrell Negative    18. Rough Pigweed Negative    19. Marsh  Elder, Rough Negative    20. Mugwort, Common Negative    21. Box, Elder Negative    22. Cedar, red Negative    23. Sweet Gum Negative    24. Pecan Pollen Negative    25. Pine Mix Negative    26. Walnut, Black Pollen Negative    27. Red Mulberry Negative    28. Ash Mix Negative    29. Birch Mix Negative    30. Beech American Negative    31. Cottonwood, Guinea-Bissau Negative    32. Hickory, White Negative    33. Maple Mix Negative    34. Oak, Guinea-Bissau Mix Negative    35. Sycamore Eastern Negative    36. Alternaria Alternata Negative    37. Cladosporium Herbarum Negative    38. Aspergillus Mix Negative    39. Penicillium Mix Negative    40. Bipolaris Sorokiniana (Helminthosporium) Negative    41. Drechslera Spicifera (Curvularia) Negative    42. Mucor Plumbeus Negative    43. Fusarium Moniliforme Negative    44. Aureobasidium Pullulans (pullulara) Negative    45. Rhizopus Oryzae Negative    46. Botrytis Cinera Negative    47. Epicoccum Nigrum Negative    49. Dust Mite Mix Negative    50. Cat Hair 10,000 BAU/ml Negative    51.  Dog  Epithelia Negative    52. Mixed Feathers Negative    53. Horse Epithelia Negative    54. Cockroach, German Negative    55. Tobacco Leaf Negative              Assessment:   1. Other allergic rhinitis     Plan/Recommendations:  Other Allergic Rhinitis: - Due to turbinate hypertrophy and unresponsive to over the counter meds, performed skin testing to identify aeroallergen triggers.   - Positive skin test 06/2023: none  - Use nasal saline rinses before nose sprays such as with Neilmed Sinus Rinse.  Use distilled water.   - Use Flonase 1-2 sprays each nostril daily. Aim upward and outward. - Use Azelastine 1-2 sprays each nostril twice daily as needed for runny nose, drainage, sneezing, congestion. Aim upward and outward. - Use Ipratroprium 1-2 sprays up to three times daily as needed for congestion and runny nose. Aim upward and outward. This can  be drying. - Use Claritin 10 mg daily.     Return if symptoms worsen or fail to improve.  Alesia Morin, MD [facility]

## 2023-07-15 NOTE — Patient Instructions (Addendum)
Other Allergic Rhinitis: - Positive skin test 06/2023: none  - Use nasal saline rinses before nose sprays such as with Neilmed Sinus Rinse.  Use distilled water.   - Use Flonase 2 sprays each nostril daily. Aim upward and outward. - Use Azelastine 1-2 sprays each nostril twice daily as needed for runny nose, drainage, sneezing, congestion. Aim upward and outward. - Use Ipratroprium 1-2 sprays up to three times daily as needed for congestion and runny nose. Aim upward and outward. This can be drying. - Use Claritin 10 mg daily.

## 2023-07-18 ENCOUNTER — Ambulatory Visit (INDEPENDENT_AMBULATORY_CARE_PROVIDER_SITE_OTHER): Payer: Medicare Other | Admitting: Podiatry

## 2023-07-18 ENCOUNTER — Encounter: Payer: Self-pay | Admitting: Podiatry

## 2023-07-18 DIAGNOSIS — M79675 Pain in left toe(s): Secondary | ICD-10-CM

## 2023-07-18 DIAGNOSIS — B351 Tinea unguium: Secondary | ICD-10-CM | POA: Diagnosis not present

## 2023-07-18 DIAGNOSIS — M79674 Pain in right toe(s): Secondary | ICD-10-CM | POA: Diagnosis not present

## 2023-07-18 NOTE — Progress Notes (Signed)
This patient returns to the office for evaluation and treatment of long thick painful nails .  This patient is unable to trim her own nails since the patient cannot reach her feet.  Patient says the nails are painful walking and wearing her shoes.  She returns for preventive foot care services.  She is accompanied by female caregiver.  General Appearance  Alert, conversant and in no acute stress.  Vascular  Dorsalis pedis and posterior tibial  pulses are   weakly  palpable  bilaterally.  Capillary return is within normal limits  bilaterally. Cold feet. bilaterally.  Neurologic  Senn-Weinstein monofilament wire test within normal limits  bilaterally. Muscle power within normal limits bilaterally.  Nails Thick disfigured discolored nails with subungual debris  from hallux to fifth toes bilaterally. No evidence of bacterial infection or drainage bilaterally.  Orthopedic  No limitations of motion  feet .  No crepitus or effusions noted.  No bony pathology or digital deformities noted.  Skin  normotropic skin with no porokeratosis noted bilaterally.  No signs of infections or ulcers noted.     Onychomycosis  Pain in toes right foot  Pain in toes left foot  Debridement  of nails  1-5  B/L with a nail nipper.  Nails were then filed using a dremel tool with no incidents.  RTC 4  months    Gregory Mayer DPM  

## 2023-08-09 ENCOUNTER — Encounter: Payer: Self-pay | Admitting: Family Medicine

## 2023-08-26 ENCOUNTER — Other Ambulatory Visit: Payer: Self-pay | Admitting: Family Medicine

## 2023-08-26 DIAGNOSIS — I10 Essential (primary) hypertension: Secondary | ICD-10-CM

## 2023-09-03 ENCOUNTER — Encounter: Payer: Self-pay | Admitting: Family Medicine

## 2023-09-03 ENCOUNTER — Ambulatory Visit (INDEPENDENT_AMBULATORY_CARE_PROVIDER_SITE_OTHER): Payer: Medicare Other | Admitting: Family Medicine

## 2023-09-03 VITALS — BP 132/77 | HR 94 | Temp 98.7°F | Ht <= 58 in | Wt 134.0 lb

## 2023-09-03 DIAGNOSIS — R6889 Other general symptoms and signs: Secondary | ICD-10-CM | POA: Diagnosis not present

## 2023-09-03 MED ORDER — METHYLPREDNISOLONE ACETATE 40 MG/ML IJ SUSP
40.0000 mg | Freq: Once | INTRAMUSCULAR | Status: AC
Start: 2023-09-03 — End: 2023-09-05
  Administered 2023-09-05: 40 mg via INTRAMUSCULAR

## 2023-09-03 NOTE — Progress Notes (Signed)
Subjective: CC:?COVID PCP: Raliegh Ip, DO NWG:NFAOZ Samantha Woods is a 89 y.o. female presenting to clinic today for:  1. URI? She is brought to the office by her daughter, who is recovering from COVID19.  Patient developed myalgia after sweeping her porch yesterday.  She suffers from chronic rhinorrhea and was placed on several nasal sprays in efforts to improve symptoms.  She denies fevers, hemoptysis, SOB, wheezing.  Home COVID test was negative this am.  Compliant with claritin.    ROS: Per HPI  Allergies  Allergen Reactions   Sulfa Antibiotics Shortness Of Breath   Lorazepam Other (See Comments)    Restless, "fidgety"   Other    Darvon [Propoxyphene] Hives   Past Medical History:  Diagnosis Date   GERD (gastroesophageal reflux disease)    Hypertension    Seizure (HCC)    Thyroid disease     Current Outpatient Medications:    acetaminophen (TYLENOL) 325 MG tablet, TAKE 2 TABLETS BY MOUTH TWICE DAILY., Disp: 120 tablet, Rfl: 0   azelastine (ASTELIN) 0.1 % nasal spray, Place 1 spray into both nostrils 2 (two) times daily as needed for rhinitis., Disp: 30 mL, Rfl: 5   celecoxib (CELEBREX) 100 MG capsule, TAKE 1 CAPSULE BY MOUTH TWICE DAILY., Disp: 60 capsule, Rfl: 0   cetirizine (ZYRTEC) 10 MG tablet, TAKE (1/2) TABLET BY MOUTH ONCE DAILY., Disp: 15 tablet, Rfl: 0   clotrimazole (LOTRIMIN) 1 % cream, APPLY 1 APPLICATION TO THE AFFECTED AREA ON THE GROIN FOR RASH 2 TIMES DAILY FOR 2 WEEKS, Disp: 30 g, Rfl: 2   diclofenac Sodium (GOODSENSE ARTHRITIS PAIN) 1 % GEL, APPLY 2 GRAMS TOPICALLY 4 TIMES DAILY, Disp: 200 g, Rfl: 3   dicyclomine (BENTYL) 20 MG tablet, Take 1 tablet (20 mg total) by mouth 2 (two) times daily as needed for up to 14 doses for spasms. Abdominal cramping, Disp: 14 tablet, Rfl: 0   docusate sodium (COLACE) 100 MG capsule, TAKE (1) CAPSULE BY MOUTH TWICE DAILY AS NEEDED FOR MILD CONSTIPATION., Disp: 60 capsule, Rfl: 0   FLORASTOR 250 MG capsule, TAKE 1  CAPSULE BY MOUTH TWICE DAILY., Disp: 60 capsule, Rfl: 0   fluticasone (FLONASE) 50 MCG/ACT nasal spray, Place 2 sprays into both nostrils daily., Disp: 16 g, Rfl: 5   furosemide (LASIX) 20 MG tablet, TAKE (1/2) TABLET BY MOUTH ONCE DAILY., Disp: 45 tablet, Rfl: 0   guaiFENesin (MUCINEX) 600 MG 12 hr tablet, Take 1 tablet (600 mg total) by mouth 2 (two) times daily as needed., Disp: 20 tablet, Rfl: 0   ipratropium (ATROVENT) 0.06 % nasal spray, Place 1 spray into both nostrils 3 (three) times daily as needed (congestion,runny nose)., Disp: 15 mL, Rfl: 5   levETIRAcetam (KEPPRA) 500 MG tablet, Take 1 tablet (500 mg total) by mouth 2 (two) times daily., Disp: 180 tablet, Rfl: 3   levothyroxine (SYNTHROID) 75 MCG tablet, TAKE (1) TABLET BY MOUTH ONCE DAILY BEFORE BREAKFAST., Disp: 90 tablet, Rfl: 3   loratadine (ALLERGY RELIEF) 10 MG tablet, TAKE 1 TABLET BY MOUTH ONCE DAILY., Disp: 30 tablet, Rfl: 5   magnesium oxide (MAG-OX) 400 (240 Mg) MG tablet, TAKE (1/2) TABLET (=200MG ) BY MOUTH TWICE DAILY., Disp: 30 tablet, Rfl: 5   Menthol-Methyl Salicylate (MUSCLE RUB) 10-15 % CREA, Apply 1 application topically 2 (two) times daily as needed for muscle pain., Disp: , Rfl: 0   Misc. Devices (TRANSFER BENCH) MISC, UAD for mobility assistance in shower., Disp: 1 each, Rfl: 0  Multiple Vitamins-Minerals (MULTIVITAMIN) tablet, Take 1 tablet by mouth daily., Disp: 30 tablet, Rfl: 5   omeprazole (PRILOSEC) 20 MG capsule, TAKE 1 CAPSULE BY MOUTH ONCE A DAY., Disp: 30 capsule, Rfl: 5   Plecanatide (TRULANCE) 3 MG TABS, Take 1 tablet (3 mg total) by mouth daily. For constipation, Disp: 30 tablet, Rfl: 12 Social History   Socioeconomic History   Marital status: Widowed    Spouse name: Not on file   Number of children: Not on file   Years of education: Not on file   Highest education level: Not on file  Occupational History   Not on file  Tobacco Use   Smoking status: Never   Smokeless tobacco: Never  Vaping  Use   Vaping status: Never Used  Substance and Sexual Activity   Alcohol use: Not Currently    Comment: occasional   Drug use: No   Sexual activity: Not on file  Other Topics Concern   Not on file  Social History Narrative   Pt lives in a 1 story home with her son and his wife one level   Has 3 adult children   Engineer, 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    Caffeine 4 cups daily   Social Determinants of Health   Financial Resource Strain: Not on file  Food Insecurity: Not on file  Transportation Needs: Not on file  Physical Activity: Not on file  Stress: Not on file  Social Connections: Not on file  Intimate Partner Violence: Not on file   Family History  Problem Relation Age of Onset   Heart disease Mother     Objective: Office vital signs reviewed. BP 132/77   Pulse 94   Temp 98.7 F (37.1 C)   Ht 4\' 8"  (1.422 m)   Wt 134 lb (60.8 kg)   SpO2 92%   BMI 30.04 kg/m   Physical Examination:  General: Awake, alert, nontoxic elderly female, NAD, No acute distress HEENT: Normal    Neck: No masses palpated. No lymphadenopathy    Ears: Tympanic membranes intact, normal light reflex, no erythema, no bulging    Eyes: PERRLA, extraocular membranes intact, sclera white    Nose: nasal turbinates moist, clear nasal discharge    Throat: moist mucus membranes, no erythema, no tonsillar exudate.  Airway is patent Cardio: regular rate and rhythm, S1S2 heard, no murmurs appreciated Pulm: clear to auscultation bilaterally, no wheezes, rhonchi or rales; normal work of breathing on room air. Clears throat intermittently  Assessment/ Plan: 89 y.o. female   Flu-like symptoms - Plan: COVID-19, Flu A+B and RSV, methylPREDNISolone acetate (DEPO-MEDROL) injection 40 mg  Check PCR covid test.  Depo given to help with myalgia.  Suspect she has myalgia as a result of sweeping yesterday. Rhinorrhea is chronic. Hold flonase. Trial of nasacort, which was provided today.  Ok to  continue astelin.  Further treatment pending COVID swab.   Raliegh Ip, DO [facility] [phone]

## 2023-09-05 ENCOUNTER — Other Ambulatory Visit (INDEPENDENT_AMBULATORY_CARE_PROVIDER_SITE_OTHER): Payer: Medicare Other

## 2023-09-05 LAB — COVID-19, FLU A+B AND RSV
Influenza A, NAA: NOT DETECTED
Influenza B, NAA: NOT DETECTED
RSV, NAA: NOT DETECTED
SARS-CoV-2, NAA: DETECTED — AB

## 2023-09-06 ENCOUNTER — Encounter: Payer: Self-pay | Admitting: Family Medicine

## 2023-09-06 NOTE — Telephone Encounter (Signed)
I've already sent this to pools. She is outside treatment window.  Supportive care.

## 2023-09-09 ENCOUNTER — Ambulatory Visit: Payer: Medicaid Other | Admitting: Family Medicine

## 2023-09-09 ENCOUNTER — Other Ambulatory Visit: Payer: Medicare Other

## 2023-09-26 ENCOUNTER — Other Ambulatory Visit: Payer: Self-pay | Admitting: Family Medicine

## 2023-09-26 DIAGNOSIS — K5909 Other constipation: Secondary | ICD-10-CM

## 2023-09-26 DIAGNOSIS — I10 Essential (primary) hypertension: Secondary | ICD-10-CM

## 2023-10-13 ENCOUNTER — Encounter: Payer: Self-pay | Admitting: Family Medicine

## 2023-10-14 ENCOUNTER — Other Ambulatory Visit: Payer: Self-pay

## 2023-10-14 MED ORDER — DOCUSATE SODIUM 100 MG PO CAPS: ORAL_CAPSULE | ORAL | 2 refills | Status: DC

## 2023-10-14 MED ORDER — LEVOTHYROXINE SODIUM 75 MCG PO TABS: ORAL_TABLET | ORAL | 3 refills | Status: DC

## 2023-10-18 ENCOUNTER — Other Ambulatory Visit: Payer: Self-pay | Admitting: Family Medicine

## 2023-10-18 DIAGNOSIS — Z7409 Other reduced mobility: Secondary | ICD-10-CM

## 2023-10-18 DIAGNOSIS — M79605 Pain in left leg: Secondary | ICD-10-CM

## 2023-10-18 DIAGNOSIS — M150 Primary generalized (osteo)arthritis: Secondary | ICD-10-CM

## 2023-10-21 ENCOUNTER — Ambulatory Visit (INDEPENDENT_AMBULATORY_CARE_PROVIDER_SITE_OTHER): Payer: Medicare Other

## 2023-10-21 DIAGNOSIS — Z23 Encounter for immunization: Secondary | ICD-10-CM | POA: Diagnosis not present

## 2023-10-21 NOTE — Progress Notes (Signed)
DME order faxed to [facility]

## 2023-10-29 ENCOUNTER — Other Ambulatory Visit: Payer: Self-pay | Admitting: Family Medicine

## 2023-10-31 ENCOUNTER — Ambulatory Visit: Payer: Self-pay | Admitting: Family Medicine

## 2023-10-31 NOTE — Telephone Encounter (Signed)
  Chief Complaint: rectal bleeding Symptoms: small amount of blood from rectum, back pain, loose stool Frequency: since this morning Pertinent Negatives: Patient denies diarrhea, vomiting, dizziness Disposition: [] ED /[] Urgent Care (no appt availability in office) / [x] Appointment(In office/virtual)/ []  [facility]/ [] Home Care/ [] Refused Recommended Disposition /[]  Mobile Bus/ []  Follow-up with PCP Additional Notes: Patient and daughter reports she has had 2 episodes of rectal bleeding today. Daughter reports small amount of blood seen when wiping after a bowel movement. Daughter states patient does have a history of constipation. Per protocol, appt scheduled tomorrow 12/6. This RN advised to call back if symptoms worsen. Patient and daughter verbalized understanding.   Copied from CRM [id number]. Topic: Clinical - Red Word Triage >> Oct 31, 2023 12:33 PM Clayton Bibles wrote: Red Word that prompted transfer to Nurse Triage: Blood in stool Reason for Disposition  MILD rectal bleeding (more than just a few drops or streaks)  Answer Assessment - Initial Assessment Questions 1. APPEARANCE of BLOOD: "What color is it?" "Is it passed separately, on the surface of the stool, or mixed in with the stool?"    bright red blood when wiping 2. AMOUNT: "How much blood was passed?"      small 3. FREQUENCY: "How many times has blood been passed with the stools?"    2 times this morning 4. ONSET: "When was the blood first seen in the stools?" (Days or weeks)      This morning 5. DIARRHEA: "Is there also some diarrhea?" If Yes, ask: "How many diarrhea stools in the past 24 hours?"      Loose, but not diarrhea 6. CONSTIPATION: "Do you have constipation?" If Yes, ask: "How bad is it?"     She has a hard time going to the bathroom at baseline 7. RECURRENT SYMPTOMS: "Have you had blood in your stools before?" If Yes, ask: "When was the last time?" and "What happened that time?"      New  symptom 8. BLOOD THINNERS: "Do you take any blood thinners?" (e.g., Coumadin/warfarin, Pradaxa/dabigatran, aspirin)     aspirin 9. OTHER SYMPTOMS: "Do you have any other symptoms?"  (e.g., abdomen pain, vomiting, dizziness, fever)     Back pain  Protocols used: Rectal Bleeding-A-AH

## 2023-11-01 ENCOUNTER — Encounter: Payer: Self-pay | Admitting: Family Medicine

## 2023-11-01 ENCOUNTER — Ambulatory Visit (INDEPENDENT_AMBULATORY_CARE_PROVIDER_SITE_OTHER): Payer: Medicare Other | Admitting: Family Medicine

## 2023-11-01 VITALS — BP 116/73 | HR 76 | Ht <= 58 in | Wt 133.0 lb

## 2023-11-01 DIAGNOSIS — K649 Unspecified hemorrhoids: Secondary | ICD-10-CM

## 2023-11-01 DIAGNOSIS — K921 Melena: Secondary | ICD-10-CM | POA: Diagnosis not present

## 2023-11-01 DIAGNOSIS — R0981 Nasal congestion: Secondary | ICD-10-CM

## 2023-11-01 MED ORDER — HYDROCORTISONE ACETATE 25 MG RE SUPP: 25.0000 mg | Freq: Two times a day (BID) | RECTAL | 0 refills | Status: DC

## 2023-11-01 NOTE — Progress Notes (Signed)
BP 116/73   Pulse 76   Ht 4\' 8"  (1.422 m)   Wt 133 lb (60.3 kg)   SpO2 93%   BMI 29.82 kg/m    Subjective:   Patient ID: Samantha Woods, female    DOB: 04-26-1921, 89 y.o.   MRN: [id number]  HPI: Samantha Woods is a 89 y.o. female presenting on 11/01/2023 for Rectal Bleeding, Constipation, and nasal drainage   HPI Patient comes in today complaining of rectal bleeding.  She says she had 2 episodes of bright red blood per stool yesterday and she has been having constipation and straining and difficulty with bowel movements.  She does have that chronically and does normally take Bentyl and Colace and occasionally does some suppositories for it.  She did do some suppositories recently.  She denies any lightheadedness or dizziness.  Relevant past medical, surgical, family and social history reviewed and updated as indicated. Interim medical history since our last visit reviewed. Allergies and medications reviewed and updated.  Review of Systems  Constitutional:  Negative for chills and fever.  HENT:  Positive for congestion, postnasal drip and rhinorrhea. Negative for ear discharge, ear pain, sinus pressure, sneezing and sore throat.   Eyes:  Negative for pain, redness and visual disturbance.  Respiratory:  Positive for cough. Negative for chest tightness and shortness of breath.   Cardiovascular:  Negative for chest pain and leg swelling.  Gastrointestinal:  Positive for anal bleeding, blood in stool and constipation. Negative for abdominal pain, diarrhea, nausea and vomiting.  Genitourinary:  Negative for difficulty urinating and dysuria.  Musculoskeletal:  Negative for back pain and gait problem.  Skin:  Negative for rash.  Neurological:  Negative for light-headedness and headaches.  Psychiatric/Behavioral:  Negative for agitation and behavioral problems.   All other systems reviewed and are negative.   Per HPI unless specifically indicated above   Allergies as of 11/01/2023        Reactions   Sulfa Antibiotics Shortness Of Breath   Lorazepam Other (See Comments)   Restless, "fidgety"   Other    Darvon [propoxyphene] Hives        Medication List        Accurate as of November 01, 2023 12:10 PM. If you have any questions, ask your nurse or doctor.          acetaminophen 325 MG tablet Commonly known as: TYLENOL TAKE 2 TABLETS BY MOUTH TWICE DAILY.   Allergy Relief 10 MG tablet Generic drug: loratadine TAKE 1 TABLET BY MOUTH ONCE DAILY.   azelastine 0.1 % nasal spray Commonly known as: ASTELIN Place 1 spray into both nostrils 2 (two) times daily as needed for rhinitis.   celecoxib 100 MG capsule Commonly known as: CELEBREX TAKE 1 CAPSULE BY MOUTH TWICE DAILY.   cetirizine 10 MG tablet Commonly known as: ZYRTEC TAKE (1/2) TABLET BY MOUTH ONCE DAILY.   clotrimazole 1 % cream Commonly known as: LOTRIMIN APPLY 1 APPLICATION TO THE AFFECTED AREA ON THE GROIN FOR RASH 2 TIMES DAILY FOR 2 WEEKS   dicyclomine 20 MG tablet Commonly known as: BENTYL Take 1 tablet (20 mg total) by mouth 2 (two) times daily as needed for up to 14 doses for spasms. Abdominal cramping   docusate sodium 100 MG capsule Commonly known as: COLACE TAKE (1) CAPSULE BY MOUTH TWICE DAILY AS NEEDED FOR MILD CONSTIPATION.   Florastor 250 MG capsule Generic drug: saccharomyces boulardii TAKE 1 CAPSULE BY MOUTH TWICE DAILY.   fluticasone 50 MCG/ACT  nasal spray Commonly known as: FLONASE Place 2 sprays into both nostrils daily.   furosemide 20 MG tablet Commonly known as: LASIX TAKE (1/2) TABLET BY MOUTH ONCE DAILY.   GoodSense Arthritis Pain 1 % Gel Generic drug: diclofenac Sodium APPLY 2 GRAMS TOPICALLY 4 TIMES DAILY   guaiFENesin 600 MG 12 hr tablet Commonly known as: Mucinex Take 1 tablet (600 mg total) by mouth 2 (two) times daily as needed.   hydrocortisone 25 MG suppository Commonly known as: ANUSOL-HC Place 1 suppository (25 mg total) rectally 2 (two)  times daily. Started by: Elige Radon Redmond Whittley   ipratropium 0.06 % nasal spray Commonly known as: ATROVENT Place 1 spray into both nostrils 3 (three) times daily as needed (congestion,runny nose).   levETIRAcetam 500 MG tablet Commonly known as: KEPPRA Take 1 tablet (500 mg total) by mouth 2 (two) times daily.   levothyroxine 75 MCG tablet Commonly known as: SYNTHROID TAKE (1) TABLET BY MOUTH ONCE DAILY BEFORE BREAKFAST.   magnesium oxide 400 (240 Mg) MG tablet Commonly known as: MAG-OX TAKE (1/2) TABLET (=200MG ) BY MOUTH TWICE DAILY.   multivitamin tablet TAKE 1 TABLET BY MOUTH ONCE DAILY.   Muscle Rub 10-15 % Crea Apply 1 application topically 2 (two) times daily as needed for muscle pain.   omeprazole 20 MG capsule Commonly known as: PRILOSEC TAKE 1 CAPSULE BY MOUTH ONCE A DAY.   Transfer Bench Misc UAD for mobility assistance in shower.   Trulance 3 MG Tabs Generic drug: Plecanatide TAKE 1 TABLET BY MOUTH DAILY. FOR CONSTIPATION.         Objective:   BP 116/73   Pulse 76   Ht 4\' 8"  (1.422 m)   Wt 133 lb (60.3 kg)   SpO2 93%   BMI 29.82 kg/m   Wt Readings from Last 3 Encounters:  11/01/23 133 lb (60.3 kg)  09/03/23 134 lb (60.8 kg)  07/10/23 134 lb 6.4 oz (61 kg)    Physical Exam Vitals and nursing note reviewed.  Constitutional:      Appearance: Normal appearance.  Genitourinary:    Rectum: Tenderness and external hemorrhoid present. No mass or anal fissure. Normal anal tone.  Neurological:     Mental Status: She is alert.       Assessment & Plan:   Problem List Items Addressed This Visit   None Visit Diagnoses     Blood in stool    -  Primary   Relevant Orders   CBC with Differential/Platelet   Hemorrhoids, unspecified hemorrhoid type       Relevant Medications   hydrocortisone (ANUSOL-HC) 25 MG suppository   Nasal congestion           Patient has some congestion has been going on long-term at night and recommended that she had  Benadryl and use any humidifier.  Patient has to small new hemorrhoids that are slightly tender.  Sent hemorrhoid cream and recommended do MiraLAX daily at first and then every other day if it gets too loose to help with constipation. Follow up plan: Return if symptoms worsen or fail to improve.  Counseling provided for all of the vaccine components Orders Placed This Encounter  Procedures   CBC with Differential/Platelet    Arville Care, MD [facility] 11/01/2023, 12:10 PM

## 2023-11-14 ENCOUNTER — Encounter: Payer: Self-pay | Admitting: Podiatry

## 2023-11-14 ENCOUNTER — Ambulatory Visit (INDEPENDENT_AMBULATORY_CARE_PROVIDER_SITE_OTHER): Payer: Medicare Other | Admitting: Podiatry

## 2023-11-14 DIAGNOSIS — M79674 Pain in right toe(s): Secondary | ICD-10-CM | POA: Diagnosis not present

## 2023-11-14 DIAGNOSIS — B351 Tinea unguium: Secondary | ICD-10-CM | POA: Diagnosis not present

## 2023-11-14 DIAGNOSIS — M79675 Pain in left toe(s): Secondary | ICD-10-CM | POA: Diagnosis not present

## 2023-11-14 NOTE — Progress Notes (Signed)
This patient returns to the office for evaluation and treatment of long thick painful nails .  This patient is unable to trim her own nails since the patient cannot reach her feet.  Patient says the nails are painful walking and wearing her shoes.  She returns for preventive foot care services.  Daughter described pain in lower legs due to  vascular status. She is accompanied by female caregiver.  General Appearance  Alert, conversant and in no acute stress.  Vascular  Dorsalis pedis are   weakly  palpable  bilaterally.  Posterior tibial pulses are absent  B/L. Capillary return is within normal limits  bilaterally. Cold feet. bilaterally.  Neurologic  Senn-Weinstein monofilament wire test within normal limits  bilaterally. Muscle power within normal limits bilaterally.  Nails Thick disfigured discolored nails with subungual debris  from hallux to fifth toes bilaterally. No evidence of bacterial infection or drainage bilaterally.  Orthopedic  No limitations of motion  feet .  No crepitus or effusions noted.  No bony pathology or digital deformities noted.  Skin  normotropic skin with no porokeratosis noted bilaterally.  No signs of infections or ulcers noted.     Onychomycosis  Pain in toes right foot  Pain in toes left foot  Debridement  of nails  1-5  B/L with a nail nipper.  Nails were then filed using a dremel tool with no incidents.  RTC 4  months    Helane Gunther DPM

## 2023-11-15 ENCOUNTER — Other Ambulatory Visit: Payer: Self-pay | Admitting: Family Medicine

## 2023-11-22 ENCOUNTER — Ambulatory Visit (INDEPENDENT_AMBULATORY_CARE_PROVIDER_SITE_OTHER): Payer: Medicare Other | Admitting: Family Medicine

## 2023-11-22 ENCOUNTER — Other Ambulatory Visit: Payer: Medicare Other

## 2023-11-22 ENCOUNTER — Encounter: Payer: Self-pay | Admitting: Family Medicine

## 2023-11-22 VITALS — BP 103/60 | HR 74 | Ht <= 58 in | Wt 132.6 lb

## 2023-11-22 DIAGNOSIS — Z789 Other specified health status: Secondary | ICD-10-CM | POA: Diagnosis not present

## 2023-11-22 DIAGNOSIS — K449 Diaphragmatic hernia without obstruction or gangrene: Secondary | ICD-10-CM | POA: Diagnosis not present

## 2023-11-22 DIAGNOSIS — R5381 Other malaise: Secondary | ICD-10-CM | POA: Diagnosis not present

## 2023-11-22 DIAGNOSIS — I10 Essential (primary) hypertension: Secondary | ICD-10-CM

## 2023-11-22 DIAGNOSIS — Z7409 Other reduced mobility: Secondary | ICD-10-CM | POA: Diagnosis not present

## 2023-11-22 DIAGNOSIS — E039 Hypothyroidism, unspecified: Secondary | ICD-10-CM

## 2023-11-22 DIAGNOSIS — G40909 Epilepsy, unspecified, not intractable, without status epilepticus: Secondary | ICD-10-CM

## 2023-11-22 DIAGNOSIS — M4124 Other idiopathic scoliosis, thoracic region: Secondary | ICD-10-CM | POA: Diagnosis not present

## 2023-11-22 NOTE — Progress Notes (Signed)
Subjective: CC: Chronic follow-up PCP: Raliegh Ip, DO ONG:EXBMW Samantha Woods is a 89 y.o. female presenting to clinic today for:  1.  Hypothyroidism/hiatal hernia Patient is compliant with medications.  She was here earlier to have labs drawn.  She reports no heart palpitations but has had some weight loss due to early satiety.  She has a known hiatal hernia.  She often has funny breathing patterns but denies overt wheezing or apnea.  She has had some rhinorrhea that is been relieved by use of Benadryl at bedtime.  No real urinary retention or delirium reported.  Continues to use Nasacort, Atrovent nasal spray and Astelin  2.  Physical deconditioning She feels generalized fatigue and weakness.  Interested in having a physical therapist come in and help strengthen her.  She suffers from back pain.  Points to the mid thoracic area of pain.  She tries to maintain posture but admits that she often hunches over, particularly at the computer.   ROS: Per HPI  Allergies  Allergen Reactions   Sulfa Antibiotics Shortness Of Breath   Lorazepam Other (See Comments)    Restless, "fidgety"   Other    Darvon [Propoxyphene] Hives   Past Medical History:  Diagnosis Date   GERD (gastroesophageal reflux disease)    Hypertension    Seizure (HCC)    Thyroid disease     Current Outpatient Medications:    acetaminophen (TYLENOL) 325 MG tablet, TAKE 2 TABLETS BY MOUTH TWICE DAILY., Disp: 120 tablet, Rfl: 0   ALLERGY RELIEF 10 MG tablet, TAKE 1 TABLET BY MOUTH ONCE DAILY., Disp: 30 tablet, Rfl: 2   azelastine (ASTELIN) 0.1 % nasal spray, Place 1 spray into both nostrils 2 (two) times daily as needed for rhinitis., Disp: 30 mL, Rfl: 5   celecoxib (CELEBREX) 100 MG capsule, TAKE 1 CAPSULE BY MOUTH TWICE DAILY., Disp: 60 capsule, Rfl: 0   cetirizine (ZYRTEC) 10 MG tablet, TAKE (1/2) TABLET BY MOUTH ONCE DAILY., Disp: 15 tablet, Rfl: 2   clotrimazole (LOTRIMIN) 1 % cream, APPLY 1 APPLICATION TO THE  AFFECTED AREA ON THE GROIN FOR RASH 2 TIMES DAILY FOR 2 WEEKS, Disp: 30 g, Rfl: 2   diclofenac Sodium (GOODSENSE ARTHRITIS PAIN) 1 % GEL, APPLY 2 GRAMS TOPICALLY 4 TIMES DAILY, Disp: 200 g, Rfl: 3   dicyclomine (BENTYL) 20 MG tablet, Take 1 tablet (20 mg total) by mouth 2 (two) times daily as needed for up to 14 doses for spasms. Abdominal cramping, Disp: 14 tablet, Rfl: 0   docusate sodium (COLACE) 100 MG capsule, TAKE (1) CAPSULE BY MOUTH TWICE DAILY AS NEEDED FOR MILD CONSTIPATION., Disp: 60 capsule, Rfl: 2   FLORASTOR 250 MG capsule, TAKE 1 CAPSULE BY MOUTH TWICE DAILY., Disp: 60 capsule, Rfl: 2   fluticasone (FLONASE) 50 MCG/ACT nasal spray, Place 2 sprays into both nostrils daily., Disp: 16 g, Rfl: 5   furosemide (LASIX) 20 MG tablet, TAKE (1/2) TABLET BY MOUTH ONCE DAILY., Disp: 45 tablet, Rfl: 2   guaiFENesin (MUCINEX) 600 MG 12 hr tablet, Take 1 tablet (600 mg total) by mouth 2 (two) times daily as needed., Disp: 20 tablet, Rfl: 0   hydrocortisone (ANUSOL-HC) 25 MG suppository, Place 1 suppository (25 mg total) rectally 2 (two) times daily., Disp: 12 suppository, Rfl: 0   ipratropium (ATROVENT) 0.06 % nasal spray, Place 1 spray into both nostrils 3 (three) times daily as needed (congestion,runny nose)., Disp: 15 mL, Rfl: 5   levETIRAcetam (KEPPRA) 500 MG tablet, Take 1  tablet (500 mg total) by mouth 2 (two) times daily., Disp: 180 tablet, Rfl: 3   levothyroxine (SYNTHROID) 75 MCG tablet, TAKE (1) TABLET BY MOUTH ONCE DAILY BEFORE BREAKFAST., Disp: 90 tablet, Rfl: 3   magnesium oxide (MAG-OX) 400 (240 Mg) MG tablet, TAKE (1/2) TABLET (=200MG ) BY MOUTH TWICE DAILY., Disp: 30 tablet, Rfl: 2   Menthol-Methyl Salicylate (MUSCLE RUB) 10-15 % CREA, Apply 1 application topically 2 (two) times daily as needed for muscle pain., Disp: , Rfl: 0   Misc. Devices (TRANSFER BENCH) MISC, UAD for mobility assistance in shower., Disp: 1 each, Rfl: 0   Multiple Vitamins-Minerals (MULTIVITAMIN) tablet, TAKE 1  TABLET BY MOUTH ONCE DAILY., Disp: 30 tablet, Rfl: 2   omeprazole (PRILOSEC) 20 MG capsule, TAKE 1 CAPSULE BY MOUTH ONCE A DAY., Disp: 30 capsule, Rfl: 2   TRULANCE 3 MG TABS, TAKE 1 TABLET BY MOUTH DAILY. FOR CONSTIPATION., Disp: 30 tablet, Rfl: 2 Social History   Socioeconomic History   Marital status: Widowed    Spouse name: Not on file   Number of children: Not on file   Years of education: Not on file   Highest education level: 12th grade  Occupational History   Not on file  Tobacco Use   Smoking status: Never   Smokeless tobacco: Never  Vaping Use   Vaping status: Never Used  Substance and Sexual Activity   Alcohol use: Not Currently    Comment: occasional   Drug use: No   Sexual activity: Not on file  Other Topics Concern   Not on file  Social History Narrative   Pt lives in a 1 story home with her son and his wife one level   Has 3 adult children   Engineer, 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    Caffeine 4 cups daily   Social Drivers of Health   Financial Resource Strain: Low Risk  (11/21/2023)   Overall Financial Resource Strain (CARDIA)    Difficulty of Paying Living Expenses: Not hard at all  Food Insecurity: No Food Insecurity (11/21/2023)   Hunger Vital Sign    Worried About Running Out of Food in the Last Year: Never true    Ran Out of Food in the Last Year: Never true  Transportation Needs: No Transportation Needs (11/21/2023)   PRAPARE - Administrator, Civil Service (Medical): No    Lack of Transportation (Non-Medical): No  Physical Activity: Unknown (11/21/2023)   Exercise Vital Sign    Days of Exercise per Week: 0 days    Minutes of Exercise per Session: Not on file  Stress: No Stress Concern Present (11/21/2023)   [employer] of Occupational Health - Occupational Stress Questionnaire    Feeling of Stress : Not at all  Social Connections: Moderately Isolated (11/21/2023)   Social Connection and Isolation Panel  [NHANES]    Frequency of Communication with Friends and Family: More than three times a week    Frequency of Social Gatherings with Friends and Family: Twice a week    Attends Religious Services: 1 to 4 times per year    Active Member of [employer] or Organizations: No    Attends Banker Meetings: Not on file    Marital Status: Widowed  Intimate Partner Violence: Not on file   Family History  Problem Relation Age of Onset   Heart disease Mother     Objective: Office vital signs reviewed. BP 103/60   Pulse 74   Ht  4\' 8"  (1.422 m)   Wt 132 lb 9.6 oz (60.1 kg)   SpO2 96%   BMI 29.73 kg/m   Physical Examination:  General: Awake, alert, nontoxic elderly female, appears well-nourished.  No acute distress HEENT: Sclera white.  Moist mucous membranes.  Wears hearing aids.  TMs are crepey but intact and without evidence of inflammation or infection Cardio: regular rate and rhythm, S1S2 heard, no murmurs appreciated Pulm: clear to auscultation bilaterally, no wheezes, rhonchi or rales; normal work of breathing on room air MSK: Ambulates with assistance.  She has some tenderness to palpation along the paraspinal muscles in the thoracic region, particularly on the left.  She has an appreciable increased kyphosis in this region and some mild scoliosis.  Assessment/ Plan: 89 y.o. female   Other idiopathic scoliosis, thoracic region - Plan: Ambulatory referral to Home Health  Impaired mobility and activities of daily living - Plan: Ambulatory referral to Home Health  Physical deconditioning - Plan: Ambulatory referral to Home Health  Acquired hypothyroidism  Hiatal hernia  We discussed that physical deconditioning, decreased energy etc. is pretty typical of 89 year old patients.  I did encourage her to be as physically active as able and will get physical therapy involved to see if we might be of improve some of the back spasms she is experiencing that I again suspect is due to  the scoliosis and increased kyphosis of the thoracic region.  We discussed the importance of maintaining posture.  Thyroid labs were collected this morning.  Will contact with results.  We discussed that hiatal hernia is likely causing this early satiety and transient breathing changes.  It was well-visualized on x-ray earlier this year and identified as a large hiatal hernia.  Given her age and generalized physical deconditioning I do not think that pursuing surgical intervention is ideal for this patient.  Should it become more bothersome, I am glad to referred to GI to discuss treatment but again I imagine they would be very unlikely to recommend surgery   Raliegh Ip, DO [facility] [phone]

## 2023-11-23 LAB — CMP14+EGFR
ALT: 18 IU/L (ref 0–32)
AST: 29 IU/L (ref 0–40)
Albumin: 3.9 g/dL (ref 3.6–4.6)
Alkaline Phosphatase: 121 IU/L (ref 44–121)
BUN/Creatinine Ratio: 17 (ref 12–28)
BUN: 15 mg/dL (ref 10–36)
Bilirubin Total: 0.5 mg/dL (ref 0.0–1.2)
CO2: 28 mmol/L (ref 20–29)
Calcium: 9.4 mg/dL (ref 8.7–10.3)
Chloride: 100 mmol/L (ref 96–106)
Creatinine, Ser: 0.87 mg/dL (ref 0.57–1.00)
Globulin, Total: 2.3 g/dL (ref 1.5–4.5)
Glucose: 97 mg/dL (ref 70–99)
Potassium: 4.5 mmol/L (ref 3.5–5.2)
Sodium: 142 mmol/L (ref 134–144)
Total Protein: 6.2 g/dL (ref 6.0–8.5)
eGFR: 59 mL/min/{1.73_m2} — ABNORMAL LOW (ref >59)

## 2023-11-23 LAB — CBC
Hematocrit: 43.7 % (ref 34.0–46.6)
Hemoglobin: 14 g/dL (ref 11.1–15.9)
MCH: 31.3 pg (ref 26.6–33.0)
MCHC: 32 g/dL (ref 31.5–35.7)
MCV: 98 fL — ABNORMAL HIGH (ref 79–97)
Platelets: 245 10*3/uL (ref 150–450)
RBC: 4.48 x10E6/uL (ref 3.77–5.28)
RDW: 12.7 % (ref 11.7–15.4)
WBC: 6.8 10*3/uL (ref 3.4–10.8)

## 2023-11-23 LAB — T4, FREE: Free T4: 1.21 ng/dL (ref 0.82–1.77)

## 2023-11-23 LAB — TSH: TSH: 4.6 u[IU]/mL — ABNORMAL HIGH (ref 0.450–4.500)

## 2023-12-13 ENCOUNTER — Ambulatory Visit: Payer: Medicaid Other | Admitting: Family Medicine

## 2023-12-19 ENCOUNTER — Other Ambulatory Visit: Payer: Self-pay | Admitting: Family Medicine

## 2023-12-19 ENCOUNTER — Encounter: Payer: Self-pay | Admitting: Family Medicine

## 2023-12-19 DIAGNOSIS — R5381 Other malaise: Secondary | ICD-10-CM

## 2023-12-19 DIAGNOSIS — M4124 Other idiopathic scoliosis, thoracic region: Secondary | ICD-10-CM

## 2023-12-19 DIAGNOSIS — Z789 Other specified health status: Secondary | ICD-10-CM

## 2023-12-20 ENCOUNTER — Other Ambulatory Visit: Payer: Self-pay | Admitting: Family Medicine

## 2023-12-20 MED ORDER — DOCUSATE SODIUM 100 MG PO CAPS: ORAL_CAPSULE | ORAL | 2 refills | Status: DC

## 2023-12-20 NOTE — Telephone Encounter (Signed)
She is amenable to out pt PT. Let me know if you need a new order

## 2023-12-20 NOTE — Telephone Encounter (Signed)
Order is in.

## 2023-12-31 ENCOUNTER — Ambulatory Visit: Payer: Medicare Other | Attending: Family Medicine

## 2023-12-31 ENCOUNTER — Other Ambulatory Visit: Payer: Self-pay

## 2023-12-31 DIAGNOSIS — R2681 Unsteadiness on feet: Secondary | ICD-10-CM | POA: Diagnosis not present

## 2023-12-31 DIAGNOSIS — Z7409 Other reduced mobility: Secondary | ICD-10-CM | POA: Insufficient documentation

## 2023-12-31 DIAGNOSIS — M6281 Muscle weakness (generalized): Secondary | ICD-10-CM | POA: Diagnosis not present

## 2023-12-31 DIAGNOSIS — M4124 Other idiopathic scoliosis, thoracic region: Secondary | ICD-10-CM | POA: Diagnosis not present

## 2023-12-31 DIAGNOSIS — R5381 Other malaise: Secondary | ICD-10-CM | POA: Diagnosis not present

## 2023-12-31 DIAGNOSIS — Z789 Other specified health status: Secondary | ICD-10-CM | POA: Diagnosis not present

## 2023-12-31 NOTE — Therapy (Signed)
 OUTPATIENT PHYSICAL THERAPY LOWER EXTREMITY EVALUATION   Patient Name: Samantha Woods MRN: [id number] DOB:Jul 18, 1921, 89 y.o., female Today's Date: 12/31/2023  END OF SESSION:  PT End of Session - 12/31/23 1414     Visit Number 1    Number of Visits 12    Date for PT Re-Evaluation 03/20/24    PT Start Time 1433    PT Stop Time 1510    PT Time Calculation (min) 37 min    Activity Tolerance Patient tolerated treatment well    Behavior During Therapy WFL for tasks assessed/performed             Past Medical History:  Diagnosis Date   GERD (gastroesophageal reflux disease)    Hypertension    Seizure (HCC)    Thyroid  disease    Past Surgical History:  Procedure Laterality Date   ABDOMINAL HYSTERECTOMY     GALLBLADDER SURGERY     Patient Active Problem List   Diagnosis Date Noted   Pain due to onychomycosis of toenails of both feet 11/14/2023   Impaired mobility and ADLs 03/12/2018   Seizure (HCC) 02/19/2018   Traumatic closed fracture of distal clavicle with minimal displacement, right, initial encounter 09/22/2017   Unwitnessed fall 09/22/2017   Seizure disorder (HCC) 09/18/2017   Acute encephalopathy 09/18/2017   Musculoskeletal pain 09/18/2017   GERD (gastroesophageal reflux disease) 09/18/2017   Cystocele, midline 08/30/2017   Tricompartmental disease of knee 08/30/2017   Hypothyroidism 01/23/2015   Gastroesophageal reflux disease with esophagitis 01/23/2015   Essential hypertension 01/23/2015   Degenerative arthritis of spine 01/23/2015   Prediabetes 01/23/2015   REFERRING PROVIDER: Jolinda Norene HERO, DO   REFERRING DIAG: Impaired mobility and activities of daily living, Other idiopathic scoliosis, thoracic region, Physical deconditioning   THERAPY DIAG:  Muscle weakness (generalized)  Unsteadiness on feet  Rationale for Evaluation and Treatment: Rehabilitation  ONSET DATE: a while   SUBJECTIVE:   SUBJECTIVE STATEMENT: Patient reports that  she has been having trouble getting around for a while now. Her trouble may have began about 6 years ago after she had a seizure and had to stay in a nursing home. She fell into the floor from her rocking chair about 2-4 months ago. She feels that she has been sore since since fell. She goes outside everyday to the mailbox using her Rolator. She feels that her legs get really tired.   PERTINENT HISTORY: Hypertension, history of seizures, and arthritis PAIN:  Are you having pain? Yes: NPRS scale: Currently: none Worst: moderate Pain location: bilateral calves Pain description: burning Aggravating factors: laying down at night Relieving factors: movement  PRECAUTIONS: Fall  RED FLAGS: None   WEIGHT BEARING RESTRICTIONS: No  FALLS:  Has patient fallen in last 6 months? Yes. Number of falls 1  LIVING ENVIRONMENT: Lives with: lives with their family Lives in: House/apartment Stairs: No Has following equipment at home: Single point cane and Environmental Consultant - 4 wheeled  OCCUPATION: retired  PLOF: Independent with basic ADLs  PATIENT GOALS: improved strength, improved mobility, be able to sleep better without her pain, be able to go up and down steps easier,  and reduced calf pain  NEXT MD VISIT: 06/24/24  OBJECTIVE:  Note: Objective measures were completed at Evaluation unless otherwise noted.  COGNITION: Overall cognitive status: Within functional limits for tasks assessed     SENSATION: Patient reports intermittent tingling in both fingers and toes  EDEMA:  No edema observed  LOWER EXTREMITY ROM: WFL for activities assessed  LOWER EXTREMITY MMT:  MMT Right eval Left eval  Hip flexion 3/5 3/5  Hip extension    Hip abduction    Hip adduction    Hip internal rotation    Hip external rotation    Knee flexion 3+/5 4-/5  Knee extension 3/5 3/5  Ankle dorsiflexion 4-/5 4-/5  Ankle plantarflexion    Ankle inversion    Ankle eversion     (Blank rows = not  tested)  FUNCTIONAL TESTS:  5 times sit to stand: 29.65 seconds with UE support from armrests 30 seconds chair stand test: 5 reps with UE support from armrests  GAIT: Assistive device utilized: Single point cane Level of assistance: Min A Comments: patient utilized her SPC and 1 hand hold assistance from her daughter for safe ambulation                                                                                                                                TREATMENT DATE:                                     12/31/23 EXERCISE LOG  Exercise Repetitions and Resistance Comments  Seated heel/ toe raises  5 reps each                     Blank cell = exercise not performed today   PATIENT EDUCATION:  Education details: HEP, plan of care, prognosis, muscular strengthening, objective findings, and goals for physical therapy Person educated: Patient and Child(ren) Education method: Explanation Education comprehension: verbalized understanding  HOME EXERCISE PROGRAM: [id number]  ASSESSMENT:  CLINICAL IMPRESSION: Patient is a 89 y.o. female who was seen today for physical therapy evaluation and treatment for deconditioning and unsteadiness on her feet. She is at a high risk of falling as evidenced by her objective measures, gait mechanics, and her history of falling. She also exhibited reduced bilateral lower extremity strength. Recommend that she continue with skilled physical therapy to address her impairments to maximize her safety and functional mobility.    OBJECTIVE IMPAIRMENTS: Abnormal gait, decreased activity tolerance, decreased balance, decreased mobility, difficulty walking, decreased strength, and pain.   ACTIVITY LIMITATIONS: carrying, lifting, bending, standing, sleeping, stairs, transfers, and locomotion level  PARTICIPATION LIMITATIONS: cleaning, laundry, and community activity  PERSONAL FACTORS: Age, Past/current experiences, Time since onset of  injury/illness/exacerbation, Transportation, and 3+ comorbidities: Hypertension, history of seizures, and arthritis  are also affecting patient's functional outcome.   REHAB POTENTIAL: Fair    CLINICAL DECISION MAKING: Evolving/moderate complexity  EVALUATION COMPLEXITY: Moderate   GOALS: Goals reviewed with patient? Yes  SHORT TERM GOALS: Target date: 01/21/24 Patient will be able to complete her initial HEP with minimal cueing.  Baseline: Goal status: INITIAL  2.  Patient will improve her five time sit to stand time to 20 seconds or less for improved  lower extremity power.  Baseline:  Goal status: INITIAL  3.  Patient will be able to complete at least 7 sit to stands within 30 seconds for improved lower extremity endurance.  Baseline:  Goal status: INITIAL  LONG TERM GOALS: Target date: 02/11/24  Patient will be able to complete her advanced HEP with minimal to no cueing.  Baseline:  Goal status: INITIAL  2.  Patient will be able to transfer from sitting to standing with minimal to no difficulty.  Baseline:  Goal status: INITIAL  3.  Patient will improve her five time sit to stand time to 15 seconds or less to reduce her fall risk.  Baseline:  Goal status: INITIAL  4.  Patient will be able to complete at least 10 sit to stands within 30 seconds for improved lower extremity endurance.    Baseline:  Goal status: INITIAL  PLAN:  PT FREQUENCY: 2x/week  PT DURATION: 6 weeks  PLANNED INTERVENTIONS: 97164- PT Re-evaluation, 97110-Therapeutic exercises, 97530- Therapeutic activity, 97112- Neuromuscular re-education, 97535- Self Care, [id number]- Manual therapy, [phone]- Gait training, Patient/Family education, Balance training, Stair training, Cryotherapy, and Moist heat  PLAN FOR NEXT SESSION: Nustep, lower extremity strengthening, gait training, and balance interventions   Lacinda JAYSON Fass, PT 12/31/2023, 5:42 PM

## 2024-01-01 ENCOUNTER — Other Ambulatory Visit: Payer: Self-pay | Admitting: Family Medicine

## 2024-01-02 ENCOUNTER — Ambulatory Visit: Payer: Medicare Other | Admitting: *Deleted

## 2024-01-02 ENCOUNTER — Encounter: Payer: Self-pay | Admitting: *Deleted

## 2024-01-02 DIAGNOSIS — M6281 Muscle weakness (generalized): Secondary | ICD-10-CM

## 2024-01-02 DIAGNOSIS — M4124 Other idiopathic scoliosis, thoracic region: Secondary | ICD-10-CM | POA: Diagnosis not present

## 2024-01-02 DIAGNOSIS — Z789 Other specified health status: Secondary | ICD-10-CM | POA: Diagnosis not present

## 2024-01-02 DIAGNOSIS — R2681 Unsteadiness on feet: Secondary | ICD-10-CM | POA: Diagnosis not present

## 2024-01-02 DIAGNOSIS — Z7409 Other reduced mobility: Secondary | ICD-10-CM | POA: Diagnosis not present

## 2024-01-02 DIAGNOSIS — R5381 Other malaise: Secondary | ICD-10-CM | POA: Diagnosis not present

## 2024-01-02 NOTE — Therapy (Signed)
 OUTPATIENT PHYSICAL THERAPY LOWER EXTREMITY TREATMENT   Patient Name: Samantha Woods MRN: [id number] DOB:09-01-1921, 89 y.o., female Today's Date: 01/02/2024  END OF SESSION:  PT End of Session - 01/02/24 1437     Visit Number 2    Number of Visits 12    Date for PT Re-Evaluation 03/20/24    PT Start Time 1430    PT Stop Time 1516    PT Time Calculation (min) 46 min             Past Medical History:  Diagnosis Date   GERD (gastroesophageal reflux disease)    Hypertension    Seizure (HCC)    Thyroid  disease    Past Surgical History:  Procedure Laterality Date   ABDOMINAL HYSTERECTOMY     GALLBLADDER SURGERY     Patient Active Problem List   Diagnosis Date Noted   Pain due to onychomycosis of toenails of both feet 11/14/2023   Impaired mobility and ADLs 03/12/2018   Seizure (HCC) 02/19/2018   Traumatic closed fracture of distal clavicle with minimal displacement, right, initial encounter 09/22/2017   Unwitnessed fall 09/22/2017   Seizure disorder (HCC) 09/18/2017   Acute encephalopathy 09/18/2017   Musculoskeletal pain 09/18/2017   GERD (gastroesophageal reflux disease) 09/18/2017   Cystocele, midline 08/30/2017   Tricompartmental disease of knee 08/30/2017   Hypothyroidism 01/23/2015   Gastroesophageal reflux disease with esophagitis 01/23/2015   Essential hypertension 01/23/2015   Degenerative arthritis of spine 01/23/2015   Prediabetes 01/23/2015   REFERRING PROVIDER: Jolinda Norene HERO, DO   REFERRING DIAG: Impaired mobility and activities of daily living, Other idiopathic scoliosis, thoracic region, Physical deconditioning   THERAPY DIAG:  Muscle weakness (generalized)  Unsteadiness on feet  Rationale for Evaluation and Treatment: Rehabilitation  ONSET DATE: a while   SUBJECTIVE:   SUBJECTIVE STATEMENT: Patient arrived today doing fairly well.   PERTINENT HISTORY: Hypertension, history of seizures, and arthritis PAIN:  Are you having  pain? Yes: NPRS scale: Currently: none Worst: moderate Pain location: bilateral calves Pain description: burning Aggravating factors: laying down at night Relieving factors: movement  PRECAUTIONS: Fall  RED FLAGS: None   WEIGHT BEARING RESTRICTIONS: No  FALLS:  Has patient fallen in last 6 months? Yes. Number of falls 1  LIVING ENVIRONMENT: Lives with: lives with their family Lives in: House/apartment Stairs: No Has following equipment at home: Single point cane and Environmental Consultant - 4 wheeled  OCCUPATION: retired  PLOF: Independent with basic ADLs  PATIENT GOALS: improved strength, improved mobility, be able to sleep better without her pain, be able to go up and down steps easier,  and reduced calf pain  NEXT MD VISIT: 06/24/24  OBJECTIVE:  Note: Objective measures were completed at Evaluation unless otherwise noted.  COGNITION: Overall cognitive status: Within functional limits for tasks assessed     SENSATION: Patient reports intermittent tingling in both fingers and toes  EDEMA:  No edema observed  LOWER EXTREMITY ROM: WFL for activities assessed  LOWER EXTREMITY MMT:  MMT Right eval Left eval  Hip flexion 3/5 3/5  Hip extension    Hip abduction    Hip adduction    Hip internal rotation    Hip external rotation    Knee flexion 3+/5 4-/5  Knee extension 3/5 3/5  Ankle dorsiflexion 4-/5 4-/5  Ankle plantarflexion    Ankle inversion    Ankle eversion     (Blank rows = not tested)  FUNCTIONAL TESTS:  5 times sit to stand: 29.65 seconds with  UE support from armrests 30 seconds chair stand test: 5 reps with UE support from armrests  GAIT: Assistive device utilized: Single point cane Level of assistance: Min A Comments: patient utilized her [facility] and 1 hand hold assistance from her daughter for safe ambulation                                                                                                                                TREATMENT DATE:                                      01/02/24 EXERCISE LOG      Bil. Les/balance  Exercise Repetitions and Resistance Comments  Seated heel/ toe raises  5 reps each    Nustep  L4 x6 mins   Seated marching 3x10   Seated ball squeeze  3x 10 with pause   Clam shell  Red  3x10   LAQ's 2# 2x10 each LE   Sit to stand X6 with UE assist   balance NBOS and UE reaches 3x10   Balance  Tandem stance with foot position changes    Blank cell = exercise not performed today   PATIENT EDUCATION:  Education details: HEP, plan of care, prognosis, muscular strengthening, objective findings, and goals for physical therapy Person educated: Patient and Child(ren) Education method: Explanation Education comprehension: verbalized understanding  HOME EXERCISE PROGRAM: [id number]  ASSESSMENT:  CLINICAL IMPRESSION: Patient arrived today doing fairly well. Rx focused on sitting and standing LE strengthening exs as well as proprioception act.'s. Pt did well with mainly mm fatigue end of session. CGA during balance .  OBJECTIVE IMPAIRMENTS: Abnormal gait, decreased activity tolerance, decreased balance, decreased mobility, difficulty walking, decreased strength, and pain.   ACTIVITY LIMITATIONS: carrying, lifting, bending, standing, sleeping, stairs, transfers, and locomotion level  PARTICIPATION LIMITATIONS: cleaning, laundry, and community activity  PERSONAL FACTORS: Age, Past/current experiences, Time since onset of injury/illness/exacerbation, Transportation, and 3+ comorbidities: Hypertension, history of seizures, and arthritis  are also affecting patient's functional outcome.   REHAB POTENTIAL: Fair    CLINICAL DECISION MAKING: Evolving/moderate complexity  EVALUATION COMPLEXITY: Moderate   GOALS: Goals reviewed with patient? Yes  SHORT TERM GOALS: Target date: 01/21/24 Patient will be able to complete her initial HEP with minimal cueing.  Baseline: Goal status: INITIAL  2.  Patient will improve her  five time sit to stand time to 20 seconds or less for improved lower extremity power.  Baseline:  Goal status: INITIAL  3.  Patient will be able to complete at least 7 sit to stands within 30 seconds for improved lower extremity endurance.  Baseline:  Goal status: INITIAL  LONG TERM GOALS: Target date: 02/11/24  Patient will be able to complete her advanced HEP with minimal to no cueing.  Baseline:  Goal status: INITIAL  2.  Patient will be able to  transfer from sitting to standing with minimal to no difficulty.  Baseline:  Goal status: INITIAL  3.  Patient will improve her five time sit to stand time to 15 seconds or less to reduce her fall risk.  Baseline:  Goal status: INITIAL  4.  Patient will be able to complete at least 10 sit to stands within 30 seconds for improved lower extremity endurance.    Baseline:  Goal status: INITIAL  PLAN:  PT FREQUENCY: 2x/week  PT DURATION: 6 weeks  PLANNED INTERVENTIONS: 97164- PT Re-evaluation, 97110-Therapeutic exercises, 97530- Therapeutic activity, 97112- Neuromuscular re-education, 97535- Self Care, [id number]- Manual therapy, [phone]- Gait training, Patient/Family education, Balance training, Stair training, Cryotherapy, and Moist heat  PLAN FOR NEXT SESSION: Nustep, lower extremity strengthening, gait training, and balance interventions   Jefry Lesinski,CHRIS, PTA 01/02/2024, 6:08 PM

## 2024-01-14 ENCOUNTER — Other Ambulatory Visit: Payer: Self-pay | Admitting: Family Medicine

## 2024-01-14 DIAGNOSIS — I10 Essential (primary) hypertension: Secondary | ICD-10-CM

## 2024-01-23 ENCOUNTER — Ambulatory Visit: Payer: Medicare Other | Admitting: Family Medicine

## 2024-01-23 ENCOUNTER — Encounter: Payer: Self-pay | Admitting: Family Medicine

## 2024-01-23 VITALS — BP 138/88 | HR 71 | Temp 98.3°F | Ht <= 58 in | Wt 131.0 lb

## 2024-01-23 DIAGNOSIS — R5382 Chronic fatigue, unspecified: Secondary | ICD-10-CM

## 2024-01-23 DIAGNOSIS — R6889 Other general symptoms and signs: Secondary | ICD-10-CM | POA: Diagnosis not present

## 2024-01-23 DIAGNOSIS — L729 Follicular cyst of the skin and subcutaneous tissue, unspecified: Secondary | ICD-10-CM

## 2024-01-23 DIAGNOSIS — K5909 Other constipation: Secondary | ICD-10-CM

## 2024-01-23 MED ORDER — IPRATROPIUM BROMIDE 0.06 % NA SOLN: 1.0000 | Freq: Three times a day (TID) | NASAL | 5 refills | Status: DC | PRN

## 2024-01-23 NOTE — Progress Notes (Signed)
 Subjective: Samantha Woods nodes PCP: Raliegh Ip, DO Samantha Woods is a 89 y.o. female presenting to clinic today for:  1.  Fatigue Patient is brought to the office by by her daughter, whom is her primary caregiver.  She notes that she seems to be a bit more fatigued as of late.  Some days she has really good days but others she just seems really worn out.  Her eating habits still fluctuate and on the days that she does not eat well she does try to make sure she gets boost.  2.  Chronic constipation Her daughter also notes that she has not been getting her docusate scheduled and her bags because the pharmacy still has the prescription listed as as needed.  She asked that we attempt to address this again for her.  For some reason changing the SIG in January did not rectify the situation.  Sometimes the patient continues to suffer with early satiety due to hiatal hernia and constipation.  3.  Skin lesions She reports a lump that is under the left axillary area.  Does not report any significant pain in that area but wanted to make note that she palpated it   ROS: Per HPI  Allergies  Allergen Reactions   Sulfa Antibiotics Shortness Of Breath   Lorazepam Other (See Comments)    Restless, "fidgety"   Other    Darvon [Propoxyphene] Hives   Past Medical History:  Diagnosis Date   GERD (gastroesophageal reflux disease)    Hypertension    Seizure (HCC)    Thyroid disease     Current Outpatient Medications:    acetaminophen (TYLENOL) 325 MG tablet, TAKE 2 TABLETS BY MOUTH TWICE DAILY., Disp: 120 tablet, Rfl: 5   ALLERGY RELIEF 10 MG tablet, TAKE 1 TABLET BY MOUTH ONCE DAILY., Disp: 30 tablet, Rfl: 5   azelastine (ASTELIN) 0.1 % nasal spray, Place 1 spray into both nostrils 2 (two) times daily as needed for rhinitis., Disp: 30 mL, Rfl: 5   celecoxib (CELEBREX) 100 MG capsule, TAKE 1 CAPSULE BY MOUTH TWICE DAILY., Disp: 60 capsule, Rfl: 5   cetirizine (ZYRTEC) 10 MG tablet,  TAKE (1/2) TABLET BY MOUTH ONCE DAILY., Disp: 15 tablet, Rfl: 2   clotrimazole (LOTRIMIN) 1 % cream, APPLY 1 APPLICATION TO THE AFFECTED AREA ON THE GROIN FOR RASH 2 TIMES DAILY FOR 2 WEEKS, Disp: 30 g, Rfl: 2   diclofenac Sodium (GOODSENSE ARTHRITIS PAIN) 1 % GEL, APPLY 2 GRAMS TOPICALLY 4 TIMES DAILY, Disp: 200 g, Rfl: 3   dicyclomine (BENTYL) 20 MG tablet, Take 1 tablet (20 mg total) by mouth 2 (two) times daily as needed for up to 14 doses for spasms. Abdominal cramping, Disp: 14 tablet, Rfl: 0   docusate sodium (COLACE) 100 MG capsule, TAKE (1) CAPSULE BY MOUTH TWICE DAILY FOR MILD CONSTIPATION., Disp: 180 capsule, Rfl: 2   furosemide (LASIX) 20 MG tablet, TAKE (1/2) TABLET BY MOUTH ONCE DAILY., Disp: 45 tablet, Rfl: 0   hydrocortisone (ANUSOL-HC) 25 MG suppository, Place 1 suppository (25 mg total) rectally 2 (two) times daily., Disp: 12 suppository, Rfl: 0   levETIRAcetam (KEPPRA) 500 MG tablet, Take 1 tablet (500 mg total) by mouth 2 (two) times daily., Disp: 180 tablet, Rfl: 3   levothyroxine (SYNTHROID) 75 MCG tablet, TAKE (1) TABLET BY MOUTH ONCE DAILY BEFORE BREAKFAST., Disp: 90 tablet, Rfl: 3   magnesium oxide (MAG-OX) 400 (240 Mg) MG tablet, TAKE (1/2) TABLET (=200MG ) BY MOUTH TWICE DAILY., Disp: 30 tablet,  Rfl: 5   Menthol-Methyl Salicylate (MUSCLE RUB) 10-15 % CREA, Apply 1 application topically 2 (two) times daily as needed for muscle pain., Disp: , Rfl: 0   Misc. Devices (TRANSFER BENCH) MISC, UAD for mobility assistance in shower., Disp: 1 each, Rfl: 0   Multiple Vitamins-Minerals (MULTIVITAMIN) tablet, TAKE 1 TABLET BY MOUTH ONCE DAILY., Disp: 30 tablet, Rfl: 5   omeprazole (PRILOSEC) 20 MG capsule, TAKE 1 CAPSULE BY MOUTH ONCE A DAY., Disp: 30 capsule, Rfl: 5   saccharomyces boulardii (FLORASTOR) 250 MG capsule, TAKE 1 CAPSULE BY MOUTH TWICE DAILY., Disp: 60 capsule, Rfl: 5   triamcinolone (NASACORT ALLERGY 24HR) 55 MCG/ACT AERO nasal inhaler, Place 2 sprays into the nose daily.,  Disp: , Rfl:    TRULANCE 3 MG TABS, TAKE 1 TABLET BY MOUTH DAILY. FOR CONSTIPATION., Disp: 30 tablet, Rfl: 2   ipratropium (ATROVENT) 0.06 % nasal spray, Place 1 spray into both nostrils 3 (three) times daily as needed (congestion,runny nose)., Disp: 15 mL, Rfl: 5 Social History   Socioeconomic History   Marital status: Widowed    Spouse name: Not on file   Number of children: Not on file   Years of education: Not on file   Highest education level: 12th grade  Occupational History   Not on file  Tobacco Use   Smoking status: Never   Smokeless tobacco: Never  Vaping Use   Vaping status: Never Used  Substance and Sexual Activity   Alcohol use: Not Currently    Comment: occasional   Drug use: No   Sexual activity: Not on file  Other Topics Concern   Not on file  Social History Narrative   Pt lives in a 1 story home with her son and his wife one level   Has 3 adult children   Engineer, 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    Caffeine 4 cups daily   Social Drivers of Health   Financial Resource Strain: Low Risk  (11/21/2023)   Overall Financial Resource Strain (CARDIA)    Difficulty of Paying Living Expenses: Not hard at all  Food Insecurity: No Food Insecurity (11/21/2023)   Hunger Vital Sign    Worried About Running Out of Food in the Last Year: Never true    Ran Out of Food in the Last Year: Never true  Transportation Needs: No Transportation Needs (11/21/2023)   PRAPARE - Administrator, Civil Service (Medical): No    Lack of Transportation (Non-Medical): No  Physical Activity: Unknown (11/21/2023)   Exercise Vital Sign    Days of Exercise per Week: 0 days    Minutes of Exercise per Session: Not on file  Stress: No Stress Concern Present (11/21/2023)   [employer] of Occupational Health - Occupational Stress Questionnaire    Feeling of Stress : Not at all  Social Connections: Moderately Isolated (11/21/2023)   Social Connection and  Isolation Panel [NHANES]    Frequency of Communication with Friends and Family: More than three times a week    Frequency of Social Gatherings with Friends and Family: Twice a week    Attends Religious Services: 1 to 4 times per year    Active Member of [employer] or Organizations: No    Attends Banker Meetings: Not on file    Marital Status: Widowed  Intimate Partner Violence: Not on file   Family History  Problem Relation Age of Onset   Heart disease Mother     Objective: Office  vital signs reviewed. BP 138/88   Pulse 71   Temp 98.3 F (36.8 C)   Ht 4\' 8"  (1.422 m)   Wt 131 lb (59.4 kg)   SpO2 94%   BMI 29.37 kg/m   Physical Examination:  General: Awake, alert, nontoxic elderly female, No acute distress HEENT: Sclera white.  Moist mucous membranes.  No exophthalmos Cardio: regular rate and rhythm, S1S2 heard, no murmurs appreciated Pulm: clear to auscultation bilaterally, no wheezes, rhonchi or rales; normal work of breathing on room air Chest: Slightly oblong but well-circumscribed, mobile, nontender mass that is approximately kidney bean sized on the left side of the rib cage.  This is about 1 hand lower than the axillary region  Assessment/ Plan: 89 y.o. female   Chronic fatigue - Plan: Vitamin B12, Basic Metabolic Panel, CBC, Iron, TIBC and Ferritin Panel, Magnesium  Subcutaneous cyst  Chronic constipation  Glad to check her thyroid levels, iron, renal function and magnesium level.  Will add B12 and CBC as well.  I suspect chronic fatigue is simply due to the fact that she is 89 years old and I did explain to the daughter that it is likely her energy levels will wax and wane.  Physically, she appeared well on exam  I examined the lesion of concern and this is consistent with a subcutaneous cyst.  It is uncomplicated but we discussed signs and symptoms of complication which would warrant further evaluation and intervention  I personally contacted her  pharmacy who looked at the January prescription hardcopy and will be faxing the prescription so that she can start getting the docusate and her pill packs.  They apologized for for the clerical error   Raliegh Ip, DO [facility] [phone]

## 2024-01-24 ENCOUNTER — Encounter: Payer: Self-pay | Admitting: Family Medicine

## 2024-01-24 LAB — BASIC METABOLIC PANEL
BUN/Creatinine Ratio: 13 (ref 12–28)
BUN: 10 mg/dL (ref 10–36)
CO2: 27 mmol/L (ref 20–29)
Calcium: 9.3 mg/dL (ref 8.7–10.3)
Chloride: 102 mmol/L (ref 96–106)
Creatinine, Ser: 0.8 mg/dL (ref 0.57–1.00)
Glucose: 99 mg/dL (ref 70–99)
Potassium: 4.5 mmol/L (ref 3.5–5.2)
Sodium: 143 mmol/L (ref 134–144)
eGFR: 65 mL/min/{1.73_m2} (ref >59)

## 2024-01-24 LAB — CBC
Hematocrit: 41.9 % (ref 34.0–46.6)
Hemoglobin: 13.7 g/dL (ref 11.1–15.9)
MCH: 31.7 pg (ref 26.6–33.0)
MCHC: 32.7 g/dL (ref 31.5–35.7)
MCV: 97 fL (ref 79–97)
Platelets: 213 10*3/uL (ref 150–450)
RBC: 4.32 x10E6/uL (ref 3.77–5.28)
RDW: 12.6 % (ref 11.7–15.4)
WBC: 5.9 10*3/uL (ref 3.4–10.8)

## 2024-01-24 LAB — IRON,TIBC AND FERRITIN PANEL
Ferritin: 109 ng/mL (ref 15–150)
Iron Saturation: 31 % (ref 15–55)
Iron: 83 ug/dL (ref 27–139)
Total Iron Binding Capacity: 270 ug/dL (ref 250–450)
UIBC: 187 ug/dL (ref 118–369)

## 2024-01-24 LAB — VITAMIN B12: Vitamin B-12: 693 pg/mL (ref 232–1245)

## 2024-01-24 LAB — MAGNESIUM: Magnesium: 2 mg/dL (ref 1.6–2.3)

## 2024-01-30 ENCOUNTER — Other Ambulatory Visit: Payer: Self-pay | Admitting: Family Medicine

## 2024-01-30 ENCOUNTER — Telehealth: Payer: Self-pay

## 2024-01-30 DIAGNOSIS — K5909 Other constipation: Secondary | ICD-10-CM

## 2024-01-30 NOTE — Telephone Encounter (Signed)
 Copied from CRM [id number]. Topic: Clinical - Prescription Issue >> Jan 30, 2024  3:45 PM Antwanette L wrote: Reason for CRM: Samantha from [facility] wants to know if Dr.Gottschalk can send in a prescription refill on TRULANCE 3 MG TABS to [facility] on [address] [place], [place], [place] [address]. The patient is currently out of the medication. [facility] outsource's to [facility]. Lelon Mast can be contacted at [phone].

## 2024-01-31 NOTE — Telephone Encounter (Signed)
 Completed by another nurse yesterday.

## 2024-01-31 NOTE — Telephone Encounter (Signed)
 I'm guessing you did this already.

## 2024-02-05 ENCOUNTER — Other Ambulatory Visit: Payer: Self-pay | Admitting: Family Medicine

## 2024-02-05 DIAGNOSIS — I10 Essential (primary) hypertension: Secondary | ICD-10-CM

## 2024-02-28 ENCOUNTER — Encounter (HOSPITAL_COMMUNITY): Payer: Self-pay

## 2024-02-28 ENCOUNTER — Emergency Department (HOSPITAL_COMMUNITY)
Admission: EM | Admit: 2024-02-28 | Discharge: 2024-02-28 | Disposition: A | Discharge status: Home / Self Care | Attending: Emergency Medicine | Admitting: Emergency Medicine

## 2024-02-28 ENCOUNTER — Other Ambulatory Visit: Payer: Self-pay

## 2024-02-28 DIAGNOSIS — I10 Essential (primary) hypertension: Secondary | ICD-10-CM | POA: Diagnosis not present

## 2024-02-28 DIAGNOSIS — Z79899 Other long term (current) drug therapy: Secondary | ICD-10-CM | POA: Insufficient documentation

## 2024-02-28 LAB — URINALYSIS, ROUTINE W REFLEX MICROSCOPIC
Bilirubin Urine: NEGATIVE
Glucose, UA: NEGATIVE mg/dL
Hgb urine dipstick: NEGATIVE
Ketones, ur: NEGATIVE mg/dL
Leukocytes,Ua: NEGATIVE
Nitrite: NEGATIVE
Protein, ur: NEGATIVE mg/dL
Specific Gravity, Urine: 1.003 — ABNORMAL LOW (ref 1.005–1.030)
pH: 8 (ref 5.0–8.0)

## 2024-02-28 LAB — RESP PANEL BY RT-PCR (RSV, FLU A&B, COVID)  RVPGX2
Influenza A by PCR: NEGATIVE
Influenza B by PCR: NEGATIVE
Resp Syncytial Virus by PCR: NEGATIVE
SARS Coronavirus 2 by RT PCR: NEGATIVE

## 2024-02-28 MED ORDER — AMLODIPINE BESYLATE 5 MG PO TABS: 2.5000 mg | ORAL_TABLET | Freq: Every day | ORAL | 0 refills | Status: DC

## 2024-02-28 MED ORDER — AMLODIPINE BESYLATE 5 MG PO TABS
5.0000 mg | ORAL_TABLET | Freq: Once | ORAL | Status: AC
  Administered 2024-02-28: 5 mg via ORAL
  Filled 2024-02-28: qty 1

## 2024-02-28 NOTE — ED Notes (Signed)
 Informed EDP of pt VS

## 2024-02-28 NOTE — ED Provider Notes (Signed)
 [facility] AT [facility] Provider Note   CSN: [id number] Arrival date & time: 02/28/24  1916     History  No chief complaint on file.   Samantha Woods is a 89 y.o. female.  89 year old female with history of hypertension who presents emergency department with elevated blood pressure.  Patient was reportedly sitting outside when she felt cold chills.  Came inside and said she was feeling nauseous.  York Spaniel that she has had some congestion and that her head feels off recently.  No severe headache.  No vomiting.  Not currently having abdominal pain.  Not on any blood pressure medication.  Family checked her blood pressure and it was 170 systolic and decided to bring her into the emergency department.       Home Medications Prior to Admission medications   Medication Sig Start Date End Date Taking? Authorizing Provider  acetaminophen (TYLENOL) 325 MG tablet TAKE 2 TABLETS BY MOUTH TWICE DAILY. 12/20/23   Gottschalk, Kathie Rhodes M, DO  ALLERGY RELIEF 10 MG tablet TAKE 1 TABLET BY MOUTH ONCE DAILY. 01/01/24   Raliegh Ip, DO  amLODipine (NORVASC) 5 MG tablet Take 1 tablet (5 mg total) by mouth daily. 02/29/24 05/29/24  Eber Hong, MD  azelastine (ASTELIN) 0.1 % nasal spray Place 1 spray into both nostrils 2 (two) times daily as needed for rhinitis. 07/03/23   Birder Robson, MD  celecoxib (CELEBREX) 100 MG capsule TAKE 1 CAPSULE BY MOUTH TWICE DAILY. 12/20/23   Raliegh Ip, DO  cetirizine (ZYRTEC) 10 MG tablet TAKE (1/2) TABLET BY MOUTH ONCE DAILY. 09/26/23   Raliegh Ip, DO  clotrimazole (LOTRIMIN) 1 % cream APPLY 1 APPLICATION TO THE AFFECTED AREA ON THE GROIN FOR RASH 2 TIMES DAILY FOR 2 WEEKS 11/09/22   Hawks, Neysa Bonito A, FNP  diclofenac Sodium (GOODSENSE ARTHRITIS PAIN) 1 % GEL APPLY 2 GRAMS TOPICALLY 4 TIMES DAILY 06/21/23   Delynn Flavin M, DO  dicyclomine (BENTYL) 20 MG tablet Take 1 tablet (20 mg total) by mouth 2 (two) times daily as  needed for up to 14 doses for spasms. Abdominal cramping 10/06/22   Terald Sleeper, MD  docusate sodium (COLACE) 100 MG capsule TAKE (1) CAPSULE BY MOUTH TWICE DAILY FOR MILD CONSTIPATION. 12/20/23   Raliegh Ip, DO  furosemide (LASIX) 20 MG tablet TAKE (1/2) TABLET BY MOUTH ONCE DAILY. 02/05/24   Raliegh Ip, DO  hydrocortisone (ANUSOL-HC) 25 MG suppository Place 1 suppository (25 mg total) rectally 2 (two) times daily. 11/01/23   Dettinger, Elige Radon, MD  ipratropium (ATROVENT) 0.06 % nasal spray Place 1 spray into both nostrils 3 (three) times daily as needed (congestion,runny nose). 01/23/24   Raliegh Ip, DO  levETIRAcetam (KEPPRA) 500 MG tablet Take 1 tablet (500 mg total) by mouth 2 (two) times daily. 07/10/23   Van Clines, MD  levothyroxine (SYNTHROID) 75 MCG tablet TAKE (1) TABLET BY MOUTH ONCE DAILY BEFORE BREAKFAST. 10/14/23   Raliegh Ip, DO  magnesium oxide (MAG-OX) 400 (240 Mg) MG tablet TAKE (1/2) TABLET (=200MG ) BY MOUTH TWICE DAILY. 12/20/23   Raliegh Ip, DO  Menthol-Methyl Salicylate (MUSCLE RUB) 10-15 % CREA Apply 1 application topically 2 (two) times daily as needed for muscle pain. 09/24/17   Rhetta Mura, MD  Misc. Devices (TRANSFER BENCH) MISC UAD for mobility assistance in shower. 04/30/23   Raliegh Ip, DO  Multiple Vitamins-Minerals (MULTIVITAMIN) tablet TAKE 1 TABLET BY MOUTH ONCE DAILY. 12/20/23  Gottschalk, Ashly M, DO  omeprazole (PRILOSEC) 20 MG capsule TAKE 1 CAPSULE BY MOUTH ONCE A DAY. 12/20/23   Gottschalk, Ashly M, DO  Plecanatide (TRULANCE) 3 MG TABS TAKE 1 TABLET BY MOUTH ONCE DAILY FOR CONSTIPATION 01/30/24   Gottschalk, Ashly M, DO  saccharomyces boulardii (FLORASTOR) 250 MG capsule TAKE 1 CAPSULE BY MOUTH TWICE DAILY. 12/20/23   Raliegh Ip, DO  triamcinolone (NASACORT ALLERGY 24HR) 55 MCG/ACT AERO nasal inhaler Place 2 sprays into the nose daily.    [provider]      Allergies    Sulfa  antibiotics, Lorazepam, Other, and Darvon [propoxyphene]    Review of Systems   Review of Systems  Physical Exam Updated Vital Signs BP (!) 135/53   Pulse (!) 54   Temp 97.8 F (36.6 C) (Oral)   Resp 20   Ht 4\' 8"  (1.422 m)   Wt 59.4 kg   SpO2 99%   BMI 29.36 kg/m  Physical Exam Vitals and nursing note reviewed.  Constitutional:      General: She is not in acute distress.    Appearance: She is well-developed.  HENT:     Head: Normocephalic and atraumatic.     Right Ear: External ear normal.     Left Ear: External ear normal.     Nose: Nose normal.  Eyes:     Extraocular Movements: Extraocular movements intact.     Conjunctiva/sclera: Conjunctivae normal.     Pupils: Pupils are equal, round, and reactive to light.  Cardiovascular:     Rate and Rhythm: Normal rate and regular rhythm.     Heart sounds: No murmur heard. Pulmonary:     Effort: Pulmonary effort is normal. No respiratory distress.     Breath sounds: Normal breath sounds.  Abdominal:     General: Abdomen is flat. There is no distension.     Palpations: Abdomen is soft. There is no mass.     Tenderness: There is no abdominal tenderness. There is no guarding.  Musculoskeletal:     Cervical back: Normal range of motion and neck supple.     Right lower leg: No edema.     Left lower leg: No edema.  Skin:    General: Skin is warm and dry.  Neurological:     Mental Status: She is alert.     Comments: MENTAL STATUS: AAOx3 CRANIAL NERVES: II: Pupils equal and reactive 4 mm BL, no RAPD, no VF deficits III, IV, VI: EOM intact, no gaze preference or deviation, no nystagmus. V: normal sensation to light touch in V1, V2, and V3 segments bilaterally VII: no facial weakness or asymmetry, no nasolabial fold flattening VIII: normal hearing to speech and finger friction IX, X: normal palatal elevation, no uvular deviation XI: 5/5 head turn and 5/5 shoulder shrug bilaterally XII: midline tongue protrusion MOTOR: 5/5  strength in R shoulder flexion, elbow flexion and extension, and grip strength. 5/5 strength in L shoulder flexion, elbow flexion and extension, and grip strength.  5/5 strength in R hip and knee flexion, knee extension, ankle plantar and dorsiflexion. 5/5 strength in L hip and knee flexion, knee extension, ankle plantar and dorsiflexion. SENSORY: Normal sensation to light touch in all extremities COORD: Normal finger to nose and heel to shin, no tremor, no dysmetria STATION: normal stance, no truncal ataxia GAIT: Normal   Psychiatric:        Mood and Affect: Mood normal.     ED Results / Procedures / Treatments  Labs (all labs ordered are listed, but only abnormal results are displayed) Labs Reviewed  URINALYSIS, ROUTINE W REFLEX MICROSCOPIC - Abnormal; Notable for the following components:      Result Value   Color, Urine COLORLESS (*)    Specific Gravity, Urine 1.003 (*)    All other components within normal limits  RESP PANEL BY RT-PCR (RSV, FLU A&B, COVID)  [id number]    EKG None  Radiology No results found.  Procedures Procedures    Medications Ordered in ED Medications  amLODipine (NORVASC) tablet 5 mg (5 mg Oral Given 02/28/24 2151)    ED Course/ Medical Decision Making/ A&P                                 Medical Decision Making Amount and/or Complexity of Data Reviewed Labs: ordered.  Risk Prescription drug management.   Kirstein Baxley is a 89 y.o. female with comorbidities that complicate the patient evaluation including hypertension who presents emergency department with elevated blood pressure  Initial Ddx:  Hypertension, hypertensive emergency, stroke, CHF, MI, seasonal allergies, URI, UTI  MDM/Course:  Patient presents emergency department elevated blood pressure.  Says that she was sitting outside and felt some chills.  Came inside and felt nauseous but not having any abdominal pain.  Says that her head feels "off" but denies any other symptoms.   Someone did say that it could be due to allergies.  On exam she has no focal neurologic deficits.  Not complaining of a headache right now.  No abdominal pain.  No symptoms that are concerning for MI or CHF.  She had a COVID and flu swab that were negative.  Urinalysis without evidence of UTI.  Initially was hypertensive into the 150s which elevated to the 200s while she was in the emergency department.  She was given amlodipine and blood pressure improved to 135/53 upon re-evaluation.  Due to her uncontrolled hypertension we will start her on a half dose of amlodipine since her daughter said that she had become hypotensive with it before.  Will have her follow-up with her primary doctor in several days as well.  This patient presents to the ED for concern of complaints listed in HPI, this involves an extensive number of treatment options, and is a complaint that carries with it a high risk of complications and morbidity. Disposition including potential need for admission considered.   Dispo: DC Home. Return precautions discussed including, but not limited to, those listed in the AVS. Allowed pt time to ask questions which were answered fully prior to dc.  Additional history obtained from daughter Records reviewed Outpatient Clinic Notes The following labs were independently interpreted: Urinalysis and show no acute abnormality I personally reviewed and interpreted cardiac monitoring: normal sinus rhythm  I have reviewed the patients home medications and made adjustments as needed Social Determinants of health:  Geriatric  Portions of this note were generated with Scientist, clinical (histocompatibility and immunogenetics). Dictation errors may occur despite best attempts at proofreading.     Final Clinical Impression(s) / ED Diagnoses Final diagnoses:  Uncontrolled hypertension    Rx / DC Orders ED Discharge Orders          Ordered    amLODipine (NORVASC) 5 MG tablet  Daily,   Status:  Discontinued        02/28/24 2327               Rondel Baton,  MD 02/29/24 1425

## 2024-02-28 NOTE — ED Triage Notes (Addendum)
 POV from home . Cc of nausea. Family took her blood pressure and said it was 160/xx not normally on BP meds. Started 1.5 hours ago.  Complains of congestion also

## 2024-02-28 NOTE — Discharge Instructions (Signed)
 You were seen for your elevated blood pressure in the emergency department.   At home, please take the amlodipine if your blood pressure is elevated.    Check your MyChart online for the results of any tests that had not resulted by the time you left the emergency department.   Follow-up with your primary doctor in 2-3 days regarding your visit.    Return immediately to the emergency department if you experience any of the following: severe headache, chest pain, difficulty breathing, or any other concerning symptoms.    Thank you for visiting our Emergency Department. It was a pleasure taking care of you today.

## 2024-02-29 ENCOUNTER — Telehealth (HOSPITAL_COMMUNITY): Payer: Self-pay | Admitting: Emergency Medicine

## 2024-02-29 MED ORDER — AMLODIPINE BESYLATE 5 MG PO TABS: 5.0000 mg | ORAL_TABLET | Freq: Every day | ORAL | 3 refills | Status: DC

## 2024-02-29 NOTE — Telephone Encounter (Signed)
 Changed pharmacy to [facility] in [place]

## 2024-03-03 ENCOUNTER — Encounter: Payer: Self-pay | Admitting: Family Medicine

## 2024-03-03 ENCOUNTER — Ambulatory Visit (INDEPENDENT_AMBULATORY_CARE_PROVIDER_SITE_OTHER)

## 2024-03-03 VITALS — BP 112/72 | HR 71 | Temp 98.3°F | Ht <= 58 in | Wt 132.0 lb

## 2024-03-03 DIAGNOSIS — J301 Allergic rhinitis due to pollen: Secondary | ICD-10-CM

## 2024-03-03 DIAGNOSIS — F439 Reaction to severe stress, unspecified: Secondary | ICD-10-CM

## 2024-03-03 DIAGNOSIS — R0989 Other specified symptoms and signs involving the circulatory and respiratory systems: Secondary | ICD-10-CM | POA: Diagnosis not present

## 2024-03-03 MED ORDER — HYDROXYZINE HCL 10 MG PO TABS: 10.0000 mg | ORAL_TABLET | Freq: Two times a day (BID) | ORAL | 3 refills | Status: DC | PRN

## 2024-03-03 NOTE — Progress Notes (Signed)
 Subjective: CC: ER follow-up for uncontrolled hypertension PCP: Raliegh Ip, DO Samantha Woods is a 89 y.o. female presenting to clinic today for:  1.  ER follow-up Patient is brought to the office by her daughter, Okey Regal, who reports that she was seen in the ER after being found to have hypertension up into systolics of 200s.  She notes that her mother has been under little bit more stress with relation to her son being treated for Alzheimer's, one of her grandchildren being diagnosed with a very rare neurologic disorder.  She also has been having some uncontrolled allergy symptoms.  She notes compliance with her nasal sprays, antihistamine.  She reports ongoing nasal congestion, for which she is seeing ENT and told that she has a deviated septum but there was nothing that could be done at her age.  She has not had any elevations of blood pressure since.  They did discharge her with amlodipine 5 mg and was advised to take half a tablet of this should she have elevation in her blood pressure but they have not utilized at all because her blood pressure has been normal.   ROS: Per HPI  Allergies  Allergen Reactions   Sulfa Antibiotics Shortness Of Breath   Lorazepam Other (See Comments)    Restless, "fidgety"   Other    Darvon [Propoxyphene] Hives   Past Medical History:  Diagnosis Date   GERD (gastroesophageal reflux disease)    Hypertension    Seizure (HCC)    Thyroid disease     Current Outpatient Medications:    acetaminophen (TYLENOL) 325 MG tablet, TAKE 2 TABLETS BY MOUTH TWICE DAILY., Disp: 120 tablet, Rfl: 5   ALLERGY RELIEF 10 MG tablet, TAKE 1 TABLET BY MOUTH ONCE DAILY., Disp: 30 tablet, Rfl: 5   amLODipine (NORVASC) 5 MG tablet, Take 1 tablet (5 mg total) by mouth daily., Disp: 0.5 tablet, Rfl: 3   azelastine (ASTELIN) 0.1 % nasal spray, Place 1 spray into both nostrils 2 (two) times daily as needed for rhinitis., Disp: 30 mL, Rfl: 5   celecoxib (CELEBREX)  100 MG capsule, TAKE 1 CAPSULE BY MOUTH TWICE DAILY., Disp: 60 capsule, Rfl: 5   clotrimazole (LOTRIMIN) 1 % cream, APPLY 1 APPLICATION TO THE AFFECTED AREA ON THE GROIN FOR RASH 2 TIMES DAILY FOR 2 WEEKS, Disp: 30 g, Rfl: 2   diclofenac Sodium (GOODSENSE ARTHRITIS PAIN) 1 % GEL, APPLY 2 GRAMS TOPICALLY 4 TIMES DAILY, Disp: 200 g, Rfl: 3   dicyclomine (BENTYL) 20 MG tablet, Take 1 tablet (20 mg total) by mouth 2 (two) times daily as needed for up to 14 doses for spasms. Abdominal cramping, Disp: 14 tablet, Rfl: 0   docusate sodium (COLACE) 100 MG capsule, TAKE (1) CAPSULE BY MOUTH TWICE DAILY FOR MILD CONSTIPATION., Disp: 180 capsule, Rfl: 2   furosemide (LASIX) 20 MG tablet, TAKE (1/2) TABLET BY MOUTH ONCE DAILY., Disp: 45 tablet, Rfl: 1   hydrocortisone (ANUSOL-HC) 25 MG suppository, Place 1 suppository (25 mg total) rectally 2 (two) times daily., Disp: 12 suppository, Rfl: 0   ipratropium (ATROVENT) 0.06 % nasal spray, Place 1 spray into both nostrils 3 (three) times daily as needed (congestion,runny nose)., Disp: 15 mL, Rfl: 5   levETIRAcetam (KEPPRA) 500 MG tablet, Take 1 tablet (500 mg total) by mouth 2 (two) times daily., Disp: 180 tablet, Rfl: 3   levothyroxine (SYNTHROID) 75 MCG tablet, TAKE (1) TABLET BY MOUTH ONCE DAILY BEFORE BREAKFAST., Disp: 90 tablet, Rfl: 3  magnesium oxide (MAG-OX) 400 (240 Mg) MG tablet, TAKE (1/2) TABLET (=200MG ) BY MOUTH TWICE DAILY., Disp: 30 tablet, Rfl: 5   Menthol-Methyl Salicylate (MUSCLE RUB) 10-15 % CREA, Apply 1 application topically 2 (two) times daily as needed for muscle pain., Disp: , Rfl: 0   Misc. Devices (TRANSFER BENCH) MISC, UAD for mobility assistance in shower., Disp: 1 each, Rfl: 0   Multiple Vitamins-Minerals (MULTIVITAMIN) tablet, TAKE 1 TABLET BY MOUTH ONCE DAILY., Disp: 30 tablet, Rfl: 5   omeprazole (PRILOSEC) 20 MG capsule, TAKE 1 CAPSULE BY MOUTH ONCE A DAY., Disp: 30 capsule, Rfl: 5   Plecanatide (TRULANCE) 3 MG TABS, TAKE 1 TABLET BY  MOUTH ONCE DAILY FOR CONSTIPATION, Disp: 90 tablet, Rfl: 1   saccharomyces boulardii (FLORASTOR) 250 MG capsule, TAKE 1 CAPSULE BY MOUTH TWICE DAILY., Disp: 60 capsule, Rfl: 5   triamcinolone (NASACORT ALLERGY 24HR) 55 MCG/ACT AERO nasal inhaler, Place 2 sprays into the nose daily., Disp: , Rfl:  Social History   Socioeconomic History   Marital status: Widowed    Spouse name: Not on file   Number of children: Not on file   Years of education: Not on file   Highest education level: 12th grade  Occupational History   Not on file  Tobacco Use   Smoking status: Never   Smokeless tobacco: Never  Vaping Use   Vaping status: Never Used  Substance and Sexual Activity   Alcohol use: Not Currently    Comment: occasional   Drug use: No   Sexual activity: Not on file  Other Topics Concern   Not on file  Social History Narrative   Pt lives in a 1 story home with her son and his wife one level   Has 3 adult children   Engineer, 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    Caffeine 4 cups daily   Social Drivers of Health   Financial Resource Strain: Low Risk  (11/21/2023)   Overall Financial Resource Strain (CARDIA)    Difficulty of Paying Living Expenses: Not hard at all  Food Insecurity: No Food Insecurity (11/21/2023)   Hunger Vital Sign    Worried About Running Out of Food in the Last Year: Never true    Ran Out of Food in the Last Year: Never true  Transportation Needs: No Transportation Needs (11/21/2023)   PRAPARE - Administrator, Civil Service (Medical): No    Lack of Transportation (Non-Medical): No  Physical Activity: Unknown (11/21/2023)   Exercise Vital Sign    Days of Exercise per Week: 0 days    Minutes of Exercise per Session: Not on file  Stress: No Stress Concern Present (11/21/2023)   [employer] of Occupational Health - Occupational Stress Questionnaire    Feeling of Stress : Not at all  Social Connections: Moderately Isolated  (11/21/2023)   Social Connection and Isolation Panel [NHANES]    Frequency of Communication with Friends and Family: More than three times a week    Frequency of Social Gatherings with Friends and Family: Twice a week    Attends Religious Services: 1 to 4 times per year    Active Member of [employer] or Organizations: No    Attends Banker Meetings: Not on file    Marital Status: Widowed  Intimate Partner Violence: Not on file   Family History  Problem Relation Age of Onset   Heart disease Mother     Objective: Office vital signs reviewed. BP 112/72  Pulse 71   Temp 98.3 F (36.8 C)   Ht 4\' 8"  (1.422 m)   Wt 132 lb (59.9 kg)   SpO2 96%   BMI 29.59 kg/m   Physical Examination:  General: Awake, alert, well appearing elderly female, No acute distress HEENT: TMs intact bilaterally with normal light reflex.  Her nasal turbinates are edematous without erythema or purulent drainage.  She has a deviated nasal septum.  Sclera are white.     01/23/2024    9:57 AM 11/22/2023    2:07 PM 11/01/2023   11:50 AM  Depression screen PHQ 2/9  Decreased Interest 0 0 0  Down, Depressed, Hopeless 0 0 0  PHQ - 2 Score 0 0 0  Altered sleeping 0 0 0  Tired, decreased energy 0 0 0  Change in appetite 0 0 0  Feeling bad or failure about yourself  0 0 0  Trouble concentrating 0 0 0  Moving slowly or fidgety/restless 0 0 0  Suicidal thoughts 0 0 0  PHQ-9 Score 0 0 0  Difficult doing work/chores Not difficult at all Not difficult at all Not difficult at all     Assessment/ Plan: 89 y.o. female   Labile blood pressure  Stress - Plan: hydrOXYzine (ATARAX) 10 MG tablet  Seasonal allergic rhinitis due to pollen - Plan: hydrOXYzine (ATARAX) 10 MG tablet  Blood pressure is normal today.  Encouraged to get a new blood pressure cuff just to ensure that we are getting accurate blood pressure readings.  She is given logs today.  It sounds like she may have had a reactive blood pressure.   I would certainly hesitate to utilize any scheduled blood pressure medications in the absence of consistently elevated blood pressure as we do risk hypotension In this centurion.  I am going to give her very low-dose Atarax that she can take as needed stressed and breakthrough allergies but I did ask her daughter to monitor her when she does her next nasal spray to ensure that she is administering this appropriately.  Her physical exam was notable for deviated septum with mild edema but no erythema of her nasal turbinates.   Raliegh Ip, DO [facility] [phone]

## 2024-03-04 ENCOUNTER — Ambulatory Visit: Admitting: Family Medicine

## 2024-03-16 ENCOUNTER — Ambulatory Visit: Payer: Medicare Other | Admitting: Podiatry

## 2024-03-16 ENCOUNTER — Encounter: Payer: Self-pay | Admitting: Podiatry

## 2024-03-16 ENCOUNTER — Other Ambulatory Visit: Payer: Self-pay | Admitting: Family Medicine

## 2024-03-16 DIAGNOSIS — M79674 Pain in right toe(s): Secondary | ICD-10-CM

## 2024-03-16 DIAGNOSIS — M79675 Pain in left toe(s): Secondary | ICD-10-CM | POA: Diagnosis not present

## 2024-03-16 DIAGNOSIS — B351 Tinea unguium: Secondary | ICD-10-CM | POA: Diagnosis not present

## 2024-03-16 MED ORDER — LORATADINE 10 MG PO TABS: 10.0000 mg | ORAL_TABLET | Freq: Every day | ORAL | 3 refills | Status: AC

## 2024-03-16 NOTE — Addendum Note (Signed)
 Addended by: Eliodoro Guerin on: 03/16/2024 12:07 PM   Modules accepted: Orders

## 2024-03-16 NOTE — Progress Notes (Signed)
 This patient returns to the office for evaluation and treatment of long thick painful nails .  This patient is unable to trim her own nails since the patient cannot reach her feet.  Patient says the nails are painful walking and wearing her shoes.  She returns for preventive foot care services.  Daughter described pain in lower legs due to  vascular status. She is accompanied by female caregiver.  General Appearance  Alert, conversant and in no acute stress.  Vascular  Dorsalis pedis are   weakly  palpable  bilaterally.  Posterior tibial pulses are absent  B/L. Capillary return is within normal limits  bilaterally. Cold feet. bilaterally.  Neurologic  Senn-Weinstein monofilament wire test within normal limits  bilaterally. Muscle power within normal limits bilaterally.  Nails Thick disfigured discolored nails with subungual debris  from hallux to fifth toes bilaterally. No evidence of bacterial infection or drainage bilaterally.  Orthopedic  No limitations of motion  feet .  No crepitus or effusions noted.  No bony pathology or digital deformities noted.  Skin  normotropic skin with no porokeratosis noted bilaterally.  No signs of infections or ulcers noted.     Onychomycosis  Pain in toes right foot  Pain in toes left foot  Debridement  of nails  1-5  B/L with a nail nipper.  Nails were then filed using a dremel tool with no incidents.  RTC 6   months    Ruffin Cotton DPM

## 2024-03-20 ENCOUNTER — Encounter: Payer: Self-pay | Admitting: Family Medicine

## 2024-03-25 NOTE — Progress Notes (Signed)
 Ordered to test for infection

## 2024-03-27 ENCOUNTER — Other Ambulatory Visit: Payer: Self-pay

## 2024-03-27 DIAGNOSIS — Z1211 Encounter for screening for malignant neoplasm of colon: Secondary | ICD-10-CM

## 2024-04-24 ENCOUNTER — Telehealth: Payer: Self-pay | Admitting: Family Medicine

## 2024-04-24 DIAGNOSIS — K5909 Other constipation: Secondary | ICD-10-CM

## 2024-04-24 DIAGNOSIS — G8929 Other chronic pain: Secondary | ICD-10-CM

## 2024-04-24 NOTE — Telephone Encounter (Signed)
 Please apologize and disregard. This was made in error.

## 2024-04-24 NOTE — Telephone Encounter (Signed)
 Pt daughter has been notified.

## 2024-04-27 NOTE — Addendum Note (Signed)
 Addended by: Eliodoro Guerin on: 04/27/2024 11:34 AM   Modules accepted: Orders

## 2024-04-27 NOTE — Telephone Encounter (Signed)
 This was addressed last week , referral was in error - has been canceled

## 2024-04-27 NOTE — Telephone Encounter (Signed)
 Copied from CRM [id number]. Topic: Referral - Question >> Apr 24, 2024  2:02 PM Fredrica W wrote: Reason for CRM: Evertt Hoe called from [facility] about a referral for patient. Checking to see why it is needed. >> Apr 27, 2024  9:36 AM Retta Caster wrote: Ambulatory referral to Gastroenterology (Order # [id number]) on 03/27/2024     Evertt Hoe from [facility] office calling in regards for clarification due to they ususally do not see patient over 100 unless underlined condtion is noted for Colonoscopy. Calling office and they were trying to get hold of nurse but they were all busy. Instructed by office to send CRM to Clinical to call back Barb. [phone]

## 2024-04-29 ENCOUNTER — Other Ambulatory Visit: Payer: Self-pay | Admitting: Family Medicine

## 2024-04-29 DIAGNOSIS — I10 Essential (primary) hypertension: Secondary | ICD-10-CM

## 2024-06-12 ENCOUNTER — Other Ambulatory Visit: Payer: Self-pay | Admitting: Family Medicine

## 2024-06-15 ENCOUNTER — Other Ambulatory Visit: Payer: Self-pay | Admitting: Neurology

## 2024-06-15 ENCOUNTER — Other Ambulatory Visit: Payer: Self-pay | Admitting: Family Medicine

## 2024-06-15 DIAGNOSIS — I10 Essential (primary) hypertension: Secondary | ICD-10-CM

## 2024-06-22 ENCOUNTER — Encounter: Payer: Self-pay | Admitting: Family Medicine

## 2024-06-24 ENCOUNTER — Encounter: Payer: Self-pay | Admitting: Family Medicine

## 2024-06-24 ENCOUNTER — Ambulatory Visit: Payer: Medicare Other | Admitting: Family Medicine

## 2024-06-24 ENCOUNTER — Other Ambulatory Visit

## 2024-06-24 VITALS — BP 127/79 | HR 73 | Temp 98.7°F | Ht <= 58 in | Wt 131.6 lb

## 2024-06-24 DIAGNOSIS — G8929 Other chronic pain: Secondary | ICD-10-CM

## 2024-06-24 DIAGNOSIS — Z8616 Personal history of COVID-19: Secondary | ICD-10-CM

## 2024-06-24 DIAGNOSIS — R002 Palpitations: Secondary | ICD-10-CM | POA: Diagnosis not present

## 2024-06-24 DIAGNOSIS — M546 Pain in thoracic spine: Secondary | ICD-10-CM

## 2024-06-24 DIAGNOSIS — Z7409 Other reduced mobility: Secondary | ICD-10-CM | POA: Diagnosis not present

## 2024-06-24 DIAGNOSIS — M1712 Unilateral primary osteoarthritis, left knee: Secondary | ICD-10-CM

## 2024-06-24 DIAGNOSIS — Z789 Other specified health status: Secondary | ICD-10-CM

## 2024-06-24 DIAGNOSIS — Z0001 Encounter for general adult medical examination with abnormal findings: Secondary | ICD-10-CM | POA: Diagnosis not present

## 2024-06-24 DIAGNOSIS — E039 Hypothyroidism, unspecified: Secondary | ICD-10-CM | POA: Diagnosis not present

## 2024-06-24 DIAGNOSIS — Z0000 Encounter for general adult medical examination without abnormal findings: Secondary | ICD-10-CM

## 2024-06-24 DIAGNOSIS — R0989 Other specified symptoms and signs involving the circulatory and respiratory systems: Secondary | ICD-10-CM | POA: Diagnosis not present

## 2024-06-24 DIAGNOSIS — K644 Residual hemorrhoidal skin tags: Secondary | ICD-10-CM

## 2024-06-24 DIAGNOSIS — R5381 Other malaise: Secondary | ICD-10-CM | POA: Diagnosis not present

## 2024-06-24 LAB — LIPID PANEL

## 2024-06-24 MED ORDER — HYDROCORTISONE ACETATE 25 MG RE SUPP: 25.0000 mg | Freq: Two times a day (BID) | RECTAL | 0 refills | Status: DC

## 2024-06-24 MED ORDER — LIDOCAINE 5 % EX PTCH: 1.0000 | MEDICATED_PATCH | CUTANEOUS | 12 refills | Status: DC

## 2024-06-24 MED ORDER — DICLOFENAC SODIUM 1 % EX GEL: 2.0000 g | Freq: Four times a day (QID) | CUTANEOUS | 3 refills | Status: AC

## 2024-06-24 NOTE — Progress Notes (Unsigned)
 Samantha Woods is a 89 y.o. female presents to office today for annual physical exam examination.    Concerns today include: 1.  Left shoulder pain She reports that she has been having some left shoulder pain.  When I asked her to point at this she is actually pointing to her medial upper back.  She has used some Salonpas  patches in the past and they do help but she is not utilizing it lately.  Also has Celebrex  and Tylenol  on board.   2. ***  Occupation: ***, Marital status: ***, Substance use: *** Health Maintenance Due  Topic Date Due   Medicare Annual Wellness (AWV)  Never done   Zoster Vaccines- Shingrix (1 of 2) Never done   COVID-19 Vaccine (2 - 2024-25 season) 07/28/2023   Refills needed today: ***  Immunization History  Administered Date(s) Administered   Fluad Quad(high Dose 65+) 08/26/2019, 09/23/2020, 09/07/2021, 09/05/2022   Fluad Trivalent(High Dose 65+) 10/21/2023   Influenza, High Dose Seasonal PF 09/10/2017   Influenza,inj,Quad PF,6+ Mos 09/14/2015, 09/18/2016   Moderna Sars-Covid-2 Vaccination 11/01/2020   PNEUMOCOCCAL CONJUGATE-20 03/08/2023   Pneumococcal Polysaccharide-23 05/23/2020   Td 02/19/2020   Past Medical History:  Diagnosis Date   GERD (gastroesophageal reflux disease)    Hypertension    Seizure (HCC)    Thyroid  disease    Social History   Socioeconomic History   Marital status: Widowed    Spouse name: Not on file   Number of children: Not on file   Years of education: Not on file   Highest education level: 12th grade  Occupational History   Not on file  Tobacco Use   Smoking status: Never   Smokeless tobacco: Never  Vaping Use   Vaping status: Never Used  Substance and Sexual Activity   Alcohol use: Not Currently    Comment: occasional   Drug use: No   Sexual activity: Not on file  Other Topics Concern   Not on file  Social History Narrative   Pt lives in a 1 story home with her son and his wife one level   Has 3 adult  children   Engineer, 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    Caffeine 4 cups daily   Social Drivers of Health   Financial Resource Strain: Low Risk  (06/22/2024)   Overall Financial Resource Strain (CARDIA)    Difficulty of Paying Living Expenses: Not hard at all  Food Insecurity: No Food Insecurity (06/22/2024)   Hunger Vital Sign    Worried About Running Out of Food in the Last Year: Never true    Ran Out of Food in the Last Year: Never true  Transportation Needs: No Transportation Needs (06/22/2024)   PRAPARE - Administrator, Civil Service (Medical): No    Lack of Transportation (Non-Medical): No  Physical Activity: Inactive (06/22/2024)   Exercise Vital Sign    Days of Exercise per Week: 0 days    Minutes of Exercise per Session: Not on file  Stress: No Stress Concern Present (06/22/2024)   [employer] of Occupational Health - Occupational Stress Questionnaire    Feeling of Stress: Not at all  Social Connections: Moderately Isolated (06/22/2024)   Social Connection and Isolation Panel    Frequency of Communication with Friends and Family: More than three times a week    Frequency of Social Gatherings with Friends and Family: Once a week    Attends Religious Services: 1 to 4 times per year  Active Member of Clubs or Organizations: No    Attends Banker Meetings: Not on file    Marital Status: Widowed  Catering manager Violence: Not on file   Past Surgical History:  Procedure Laterality Date   ABDOMINAL HYSTERECTOMY     GALLBLADDER SURGERY     Family History  Problem Relation Age of Onset   Heart disease Mother     Current Outpatient Medications:    acetaminophen  (TYLENOL ) 325 MG tablet, TAKE 2 TABLETS BY MOUTH TWICE DAILY., Disp: 120 tablet, Rfl: 0   azelastine  (ASTELIN ) 0.1 % nasal spray, Place 1 spray into both nostrils 2 (two) times daily as needed for rhinitis., Disp: 30 mL, Rfl: 5   celecoxib  (CELEBREX ) 100 MG capsule, TAKE  1 CAPSULE BY MOUTH TWICE DAILY., Disp: 60 capsule, Rfl: 3   clotrimazole  (LOTRIMIN ) 1 % cream, APPLY 1 APPLICATION TO THE AFFECTED AREA ON THE GROIN FOR RASH 2 TIMES DAILY FOR 2 WEEKS, Disp: 30 g, Rfl: 2   diclofenac  Sodium (GOODSENSE ARTHRITIS PAIN) 1 % GEL, APPLY 2 GRAMS TOPICALLY 4 TIMES DAILY, Disp: 200 g, Rfl: 3   dicyclomine  (BENTYL ) 20 MG tablet, Take 1 tablet (20 mg total) by mouth 2 (two) times daily as needed for up to 14 doses for spasms. Abdominal cramping, Disp: 14 tablet, Rfl: 0   docusate sodium  (COLACE) 100 MG capsule, TAKE (1) CAPSULE BY MOUTH TWICE DAILY FOR MILD CONSTIPATION., Disp: 180 capsule, Rfl: 2   furosemide  (LASIX ) 20 MG tablet, TAKE (1/2) TABLET BY MOUTH ONCE DAILY., Disp: 45 tablet, Rfl: 0   hydrocortisone  (ANUSOL -HC) 25 MG suppository, Place 1 suppository (25 mg total) rectally 2 (two) times daily., Disp: 12 suppository, Rfl: 0   hydrOXYzine  (ATARAX ) 10 MG tablet, Take 1 tablet (10 mg total) by mouth 2 (two) times daily as needed for anxiety (breakthrough allergy )., Disp: 30 tablet, Rfl: 3   ipratropium (ATROVENT ) 0.06 % nasal spray, Place 1 spray into both nostrils 3 (three) times daily as needed (congestion,runny nose)., Disp: 15 mL, Rfl: 5   levETIRAcetam  (KEPPRA ) 500 MG tablet, TAKE 1 TABLET BY MOUTH TWICE DAILY., Disp: 60 tablet, Rfl: 0   levothyroxine  (SYNTHROID ) 75 MCG tablet, TAKE (1) TABLET BY MOUTH ONCE DAILY BEFORE BREAKFAST., Disp: 90 tablet, Rfl: 3   loratadine  (CLARITIN ) 10 MG tablet, Take 1 tablet (10 mg total) by mouth daily., Disp: 100 tablet, Rfl: 3   magnesium  oxide (MAG-OX) 400 (240 Mg) MG tablet, TAKE (1/2) TABLET (=200MG ) BY MOUTH TWICE DAILY., Disp: 30 tablet, Rfl: 0   Menthol -Methyl Salicylate  (MUSCLE RUB) 10-15 % CREA, Apply 1 application topically 2 (two) times daily as needed for muscle pain., Disp: , Rfl: 0   Misc. Devices (TRANSFER BENCH) MISC, UAD for mobility assistance in shower., Disp: 1 each, Rfl: 0   Multiple Vitamins-Minerals  (MULTIVITAMIN) tablet, TAKE 1 TABLET BY MOUTH ONCE DAILY., Disp: 30 tablet, Rfl: 0   omeprazole  (PRILOSEC) 20 MG capsule, TAKE 1 CAPSULE BY MOUTH ONCE A DAY., Disp: 30 capsule, Rfl: 0   Plecanatide  (TRULANCE ) 3 MG TABS, TAKE 1 TABLET BY MOUTH ONCE DAILY FOR CONSTIPATION, Disp: 90 tablet, Rfl: 1   saccharomyces boulardii (FLORASTOR) 250 MG capsule, TAKE 1 CAPSULE BY MOUTH TWICE DAILY., Disp: 60 capsule, Rfl: 0   triamcinolone (NASACORT ALLERGY  24HR) 55 MCG/ACT AERO nasal inhaler, Place 2 sprays into the nose daily., Disp: , Rfl:   Allergies  Allergen Reactions   Sulfa Antibiotics Shortness Of Breath   Lorazepam  Other (See Comments)  Restless, fidgety   Other    Darvon [Propoxyphene] Hives     ROS: Review of Systems {ros; complete:30496}    Physical exam {Exam, Complete:[phone]}      01/23/2024    9:57 AM 11/22/2023    2:07 PM 11/01/2023   11:50 AM  Depression screen PHQ 2/9  Decreased Interest 0 0 0  Down, Depressed, Hopeless 0 0 0  PHQ - 2 Score 0 0 0  Altered sleeping 0 0 0  Tired, decreased energy 0 0 0  Change in appetite 0 0 0  Feeling bad or failure about yourself  0 0 0  Trouble concentrating 0 0 0  Moving slowly or fidgety/restless 0 0 0  Suicidal thoughts 0 0 0  PHQ-9 Score 0 0 0  Difficult doing work/chores Not difficult at all Not difficult at all Not difficult at all      01/23/2024    9:56 AM 11/22/2023    2:08 PM 11/01/2023   11:50 AM 09/03/2023   10:46 AM  GAD 7 : Generalized Anxiety Score  Nervous, Anxious, on Edge 0 0 0 0  Control/stop worrying 0 0 0 0  Worry too much - different things 0 0 0 0  Trouble relaxing 0 0 0 0  Restless 0 0 0 0  Easily annoyed or irritable 0 0 0 0  Afraid - awful might happen 0 0 0 0  Total GAD 7 Score 0 0 0 0  Anxiety Difficulty  Not difficult at all Not difficult at all Not difficult at all     Assessment/ Plan: Alfonse Bigness here for annual physical exam.   Annual physical exam  Acquired  hypothyroidism  Physical deconditioning  Impaired mobility and activities of daily living  Labile blood pressure  ***  Counseled on healthy lifestyle choices, including diet (rich in fruits, vegetables and lean meats and low in salt and simple carbohydrates) and exercise (at least 30 minutes of moderate physical activity daily).  Patient to follow up ***  Anetria Harwick M. Jolinda, DO

## 2024-06-25 ENCOUNTER — Encounter: Payer: Self-pay | Admitting: Neurology

## 2024-06-25 ENCOUNTER — Ambulatory Visit: Admitting: Neurology

## 2024-06-25 ENCOUNTER — Other Ambulatory Visit (HOSPITAL_COMMUNITY): Payer: Self-pay

## 2024-06-25 ENCOUNTER — Telehealth: Payer: Self-pay

## 2024-06-25 VITALS — BP 130/76 | HR 70 | Ht <= 58 in | Wt 132.0 lb

## 2024-06-25 DIAGNOSIS — G40009 Localization-related (focal) (partial) idiopathic epilepsy and epileptic syndromes with seizures of localized onset, not intractable, without status epilepticus: Secondary | ICD-10-CM

## 2024-06-25 LAB — CMP14+EGFR
ALT: 18 IU/L (ref 0–32)
AST: 30 IU/L (ref 0–40)
Albumin: 4.2 g/dL (ref 3.6–4.6)
Alkaline Phosphatase: 126 IU/L — AB (ref 44–121)
BUN/Creatinine Ratio: 15 (ref 12–28)
BUN: 13 mg/dL (ref 10–36)
Bilirubin Total: 0.6 mg/dL (ref 0.0–1.2)
CO2: 24 mmol/L (ref 20–29)
Calcium: 9.6 mg/dL (ref 8.7–10.3)
Chloride: 100 mmol/L (ref 96–106)
Creatinine, Ser: 0.86 mg/dL (ref 0.57–1.00)
Globulin, Total: 2.3 g/dL (ref 1.5–4.5)
Glucose: 98 mg/dL (ref 70–99)
Potassium: 4.6 mmol/L (ref 3.5–5.2)
Sodium: 139 mmol/L (ref 134–144)
Total Protein: 6.5 g/dL (ref 6.0–8.5)
eGFR: 60 mL/min/1.73 (ref >59)

## 2024-06-25 LAB — CBC WITH DIFFERENTIAL/PLATELET
Basophils Absolute: 0.1 x10E3/uL (ref 0.0–0.2)
Basos: 1 %
EOS (ABSOLUTE): 0.2 x10E3/uL (ref 0.0–0.4)
Eos: 3 %
Hematocrit: 41.8 % (ref 34.0–46.6)
Hemoglobin: 13.8 g/dL (ref 11.1–15.9)
Immature Grans (Abs): 0 x10E3/uL (ref 0.0–0.1)
Immature Granulocytes: 0 %
Lymphocytes Absolute: 1.8 x10E3/uL (ref 0.7–3.1)
Lymphs: 26 %
MCH: 32.2 pg (ref 26.6–33.0)
MCHC: 33 g/dL (ref 31.5–35.7)
MCV: 98 fL — ABNORMAL HIGH (ref 79–97)
Monocytes Absolute: 0.8 x10E3/uL (ref 0.1–0.9)
Monocytes: 11 %
Neutrophils Absolute: 4.1 x10E3/uL (ref 1.4–7.0)
Neutrophils: 59 %
Platelets: 229 x10E3/uL (ref 150–450)
RBC: 4.28 x10E6/uL (ref 3.77–5.28)
RDW: 12.6 % (ref 11.7–15.4)
WBC: 6.9 x10E3/uL (ref 3.4–10.8)

## 2024-06-25 LAB — LIPID PANEL
Cholesterol, Total: 128 mg/dL (ref 100–199)
HDL: 56 mg/dL (ref >39)
LDL CALC COMMENT:: 2.3 ratio (ref 0.0–4.4)
LDL Chol Calc (NIH): 55 mg/dL (ref 0–99)
Triglycerides: 90 mg/dL (ref 0–149)
VLDL Cholesterol Cal: 17 mg/dL (ref 5–40)

## 2024-06-25 LAB — TSH+FREE T4
Free T4: 1.2 ng/dL (ref 0.82–1.77)
TSH: 2.74 u[IU]/mL (ref 0.450–4.500)

## 2024-06-25 LAB — MAGNESIUM: Magnesium: 2.2 mg/dL (ref 1.6–2.3)

## 2024-06-25 MED ORDER — LEVETIRACETAM 500 MG PO TABS: 500.0000 mg | ORAL_TABLET | Freq: Two times a day (BID) | ORAL | 4 refills | Status: AC

## 2024-06-25 NOTE — Telephone Encounter (Signed)
 Pharmacy Patient Advocate Encounter   Received notification from CoverMyMeds that prior authorization for Lidoderm  5% patches is required/requested.   Insurance verification completed.   The patient is insured through [facility] .   Per test claim: PA required; PA submitted to above mentioned insurance via CoverMyMeds Key/confirmation #/EOC [id number] Status is pending

## 2024-06-25 NOTE — Telephone Encounter (Signed)
 Since lidocaine  patches denied do you want to send something else in or should pt just go back to using the Salonpas  patches?

## 2024-06-25 NOTE — Telephone Encounter (Signed)
 Pharmacy Patient Advocate Encounter  Received notification from [employer] that Prior Authorization for Lidoderm  5% patches  has been DENIED.  Full denial letter will be uploaded to the media tab. See denial reason below.   PA #/Case ID/Reference #:  [id number]

## 2024-06-25 NOTE — Patient Instructions (Signed)
 It's always a pleasure to see you! Continue Keppra  (Levetiracetam ) 500mg  twice a day. Follow-up in 1 year, call for any changes.   Seizure Precautions: 1. If medication has been prescribed for you to prevent seizures, take it exactly as directed.  Do not stop taking the medicine without talking to your doctor first, even if you have not had a seizure in a long time.   2. Avoid activities in which a seizure would cause danger to yourself or to others.  Don't operate dangerous machinery, swim alone, or climb in high or dangerous places, such as on ladders, roofs, or girders.  Do not drive unless your doctor says you may.  3. If you have any warning that you may have a seizure, lay down in a safe place where you can't hurt yourself.    4.  No driving for 6 months from last seizure, as per [place]  state law.   Please refer to the following link on the Epilepsy Foundation of America's website for more information: http://www.epilepsyfoundation.org/answerplace/Social/driving/drivingu.cfm   5.  Maintain good sleep hygiene.   6.  Contact your doctor if you have any problems that may be related to the medicine you are taking.  7.  Call 911 and bring the patient back to the ED if:        A.  The seizure lasts longer than 5 minutes.       B.  The patient doesn't awaken shortly after the seizure  C.  The patient has new problems such as difficulty seeing, speaking or moving  D.  The patient was injured during the seizure  E.  The patient has a temperature over 102 F (39C)  F.  The patient vomited and now is having trouble breathing

## 2024-06-25 NOTE — Progress Notes (Signed)
 NEUROLOGY FOLLOW UP OFFICE NOTE  Samantha Woods [id number] 27-Dec-1920  HISTORY OF PRESENT ILLNESS: I had the pleasure of seeing Samantha Woods in follow-up in the neurology clinic on 06/25/2024.  The patient was last seen a year ago for seizures. She is again accompanied by her daughter Niels who helps supplement the history today.  Records and images were personally reviewed where available. She had 2 seizures in October 2018 with unremarkable workup. She reported lethargy on Levetiracetam  500mg  BID, however after reduction in dose, she had another seizure in 2019. No further seizures since increasing back to 500mg  BID, no side effects. They deny any staring/unresponsive episodes, myoclonic jerks. She denies any headaches but has congestion with postnasal drip. She gets a little dizzy sometimes and holds on to things. She uses a rollator at home but a cane outdoors. She slipped off her rocking chair last month, no injuries. She has a Zio patch on, Niels was checking her BP and noticing irregular heart rhythm. She denies any palpitations, chest pain, shortness of breath. She wakes up every 3 hours to use the bathroom, she usually eats a little snack (Dr. Nunzio and cheese puff) then goes back to sleep. She walks to the mailbox for exercise. Medications come in pillpacks, she manages her own medications and denies missing doses.    History on Initial Assessment 10/30/2017: This is a very pleasant 89 yo RH woman with a history of hypertension, hypothyroidism, with new onset seizure last 09/18/2017. She recalls she was crocheting, bent down to get something, then had no recollection of subsequent events until she was in the hospital. On further questioning, she does not remember much of her hospital stay. Her son reports she does not recall much of being in a SNF afterwards either. Her son came home that day and found her on the floor. She was not talking for more than a day. [facility] records reviewed, she was  lethargic, confused, non-verbal. CT perfusion did not show any evidence of stroke. She was started on empiric antibiotics and lumbar puncture was planned. Prior to LP being performed, she had a seizure and was given 1.5mg  Ativan . She had an MRI brain which I personally reviewed, no acute changes seen. EEG showed diffuse slowing, no epileptiform discharges seen. Her LP was overall unremarkable except for an elevated protein of 62. Urine culture positive for Klebsiella. It was felt that seizure was secondary to UTI, but at age 32, seizure predisposition is likely, and further seizures could occur with future infections, hence she was discharged on Keppra  500mg  BID.    There have been no further similar spells since October. No prior similar episodes in the past. She has been living with her son for the past 3 years, and he reports that prior to October, she had been doing pretty well, in charge of her own medications, pulling weeds in the yard. There has been a big change since return home, her son now puts her pills in a pillbox and sets a timer for her, which she does fine with. She cannot remember things for any length of time. She is still not able to walk very far, she now has to use a walker at all times (previously only used prn cane). She did break her collarbone when she fell, but denies any pain. She feels her left side is weaker. She reports that this is chronic, she has always had to lift her left leg to get into a car. She has pain in her  left knee and had an injection before her hospitalization. Her son reports she is not walking like she normally did.   She has occasional sharp pains behind the eyes. She has some dizziness upon standing. There is occasional numbness in her fingertips and both feet. She used to crochet a lot, but not like before per son. He has to help her into the shower but she can bathe herself. She needs help lifting her left leg in and out of the shower. She is able to dress  independently. She has some pain under the right side of her chest when combing her hair. She stopped driving at age 25. Her son is in charge of finances. She has urinary frequency, her son has been waking her up at 3am to use the bathroom, otherwise she would wet the bed. Since hospital discharge, she has been sleeping all the time. Her son denies any staring/unresponsive episodes. She denies any olfactory/gustatory hallucinations, deja vu, rising epigastric sensation, myoclonic jerks.   Epilepsy Risk Factors:  Her youngest sister had a seizure while pregnant. Otherwise she had a normal birth and early development.  There is no history of febrile convulsions, CNS infections such as meningitis/encephalitis, significant traumatic brain injury, neurosurgical procedures.  PAST MEDICAL HISTORY: Past Medical History:  Diagnosis Date   GERD (gastroesophageal reflux disease)    Hypertension    Seizure (HCC)    Thyroid  disease     MEDICATIONS: Current Outpatient Medications on File Prior to Visit  Medication Sig Dispense Refill   acetaminophen  (TYLENOL ) 325 MG tablet TAKE 2 TABLETS BY MOUTH TWICE DAILY. 120 tablet 0   azelastine  (ASTELIN ) 0.1 % nasal spray Place 1 spray into both nostrils 2 (two) times daily as needed for rhinitis. 30 mL 5   celecoxib  (CELEBREX ) 100 MG capsule TAKE 1 CAPSULE BY MOUTH TWICE DAILY. 60 capsule 3   clotrimazole  (LOTRIMIN ) 1 % cream APPLY 1 APPLICATION TO THE AFFECTED AREA ON THE GROIN FOR RASH 2 TIMES DAILY FOR 2 WEEKS 30 g 2   diclofenac  Sodium (GOODSENSE ARTHRITIS PAIN) 1 % GEL Apply 2 g topically 4 (four) times daily. 200 g 3   docusate sodium  (COLACE) 100 MG capsule TAKE (1) CAPSULE BY MOUTH TWICE DAILY FOR MILD CONSTIPATION. 180 capsule 2   furosemide  (LASIX ) 20 MG tablet TAKE (1/2) TABLET BY MOUTH ONCE DAILY. 45 tablet 0   hydrocortisone  (ANUSOL -HC) 25 MG suppository Place 1 suppository (25 mg total) rectally 2 (two) times daily. 12 suppository 0   hydrOXYzine   (ATARAX ) 10 MG tablet Take 1 tablet (10 mg total) by mouth 2 (two) times daily as needed for anxiety (breakthrough allergy ). 30 tablet 3   ipratropium (ATROVENT ) 0.06 % nasal spray Place 1 spray into both nostrils 3 (three) times daily as needed (congestion,runny nose). 15 mL 5   levETIRAcetam  (KEPPRA ) 500 MG tablet TAKE 1 TABLET BY MOUTH TWICE DAILY. 60 tablet 0   levothyroxine  (SYNTHROID ) 75 MCG tablet TAKE (1) TABLET BY MOUTH ONCE DAILY BEFORE BREAKFAST. 90 tablet 3   lidocaine  (LIDODERM ) 5 % Place 1 patch onto the skin daily. To shoulder as needed for pain. Remove & Discard patch within 12 hours or as directed by MD 30 patch 12   loratadine  (CLARITIN ) 10 MG tablet Take 1 tablet (10 mg total) by mouth daily. 100 tablet 3   magnesium  oxide (MAG-OX) 400 (240 Mg) MG tablet TAKE (1/2) TABLET (=200MG ) BY MOUTH TWICE DAILY. 30 tablet 0   Menthol -Methyl Salicylate  (MUSCLE RUB) 10-15 % CREA  Apply 1 application topically 2 (two) times daily as needed for muscle pain.  0   Misc. Devices (TRANSFER BENCH) MISC UAD for mobility assistance in shower. 1 each 0   Multiple Vitamins-Minerals (MULTIVITAMIN) tablet TAKE 1 TABLET BY MOUTH ONCE DAILY. 30 tablet 0   omeprazole  (PRILOSEC) 20 MG capsule TAKE 1 CAPSULE BY MOUTH ONCE A DAY. 30 capsule 0   Plecanatide  (TRULANCE ) 3 MG TABS TAKE 1 TABLET BY MOUTH ONCE DAILY FOR CONSTIPATION 90 tablet 1   saccharomyces boulardii (FLORASTOR) 250 MG capsule TAKE 1 CAPSULE BY MOUTH TWICE DAILY. 60 capsule 0   triamcinolone (NASACORT ALLERGY  24HR) 55 MCG/ACT AERO nasal inhaler Place 2 sprays into the nose daily.     No current facility-administered medications on file prior to visit.    ALLERGIES: Allergies  Allergen Reactions   Sulfa Antibiotics Shortness Of Breath   Lorazepam  Other (See Comments)    Restless, fidgety   Other    Darvon [Propoxyphene] Hives    FAMILY HISTORY: Family History  Problem Relation Age of Onset   Heart disease Mother     SOCIAL  HISTORY: Social History   Socioeconomic History   Marital status: Widowed    Spouse name: Not on file   Number of children: Not on file   Years of education: Not on file   Highest education level: 12th grade  Occupational History   Not on file  Tobacco Use   Smoking status: Never   Smokeless tobacco: Never  Vaping Use   Vaping status: Never Used  Substance and Sexual Activity   Alcohol use: Not Currently    Comment: occasional   Drug use: No   Sexual activity: Not on file  Other Topics Concern   Not on file  Social History Narrative   Pt lives in a 1 story home with her son and his wife one level   Has 3 adult children   Engineer, 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    Caffeine 4 cups daily   Social Drivers of Health   Financial Resource Strain: Low Risk  (06/22/2024)   Overall Financial Resource Strain (CARDIA)    Difficulty of Paying Living Expenses: Not hard at all  Food Insecurity: No Food Insecurity (06/22/2024)   Hunger Vital Sign    Worried About Running Out of Food in the Last Year: Never true    Ran Out of Food in the Last Year: Never true  Transportation Needs: No Transportation Needs (06/22/2024)   PRAPARE - Administrator, Civil Service (Medical): No    Lack of Transportation (Non-Medical): No  Physical Activity: Inactive (06/22/2024)   Exercise Vital Sign    Days of Exercise per Week: 0 days    Minutes of Exercise per Session: Not on file  Stress: No Stress Concern Present (06/22/2024)   [employer] of Occupational Health - Occupational Stress Questionnaire    Feeling of Stress: Not at all  Social Connections: Moderately Isolated (06/22/2024)   Social Connection and Isolation Panel    Frequency of Communication with Friends and Family: More than three times a week    Frequency of Social Gatherings with Friends and Family: Once a week    Attends Religious Services: 1 to 4 times per year    Active Member of [employer] or  Organizations: No    Attends Banker Meetings: Not on file    Marital Status: Widowed  Intimate Partner Violence: Not on file  PHYSICAL EXAM: Vitals:   06/25/24 1248  BP: 130/76  Pulse: 70  SpO2: 94%   General: No acute distress Head:  Normocephalic/atraumatic Skin/Extremities: No rash, no edema Neurological Exam: alert and awake. No aphasia or dysarthria. Fund of knowledge is appropriate. Attention and concentration are normal.   Cranial nerves: Pupils equal, round. Extraocular movements intact with no nystagmus. Visual fields full.  No facial asymmetry.  Motor: Bulk and tone normal, muscle strength 5/5 throughout with no pronator drift.   Finger to nose testing intact.  Gait hunched posture, slow and cautious with left knee bent at the knee using cane. No ataxia. No tremors.    IMPRESSION: This is a pleasant 89 yo RH woman with a history of hypertension, hypothyroidism, who had a seizure in 08/2017 in the setting of a UTI. MRI brain no acute changes, EEG showed diffuse slowing. She was started on Levetiracetam  but had side effects initially on 500mg  BID, however with dose reduction, she had another seizure in 2019. She has been seizure-free since 2019 back on Levetiracetam  500mg  BID without side effects. She does not drive. Continue close supervision. Follow-up in 1 year, call for any changes.   Thank you for allowing me to participate in her care.  Please do not hesitate to call for any questions or concerns.    Darice Shivers, M.D.   CC: Dr. Jolinda

## 2024-06-26 ENCOUNTER — Ambulatory Visit: Payer: Self-pay | Admitting: Family Medicine

## 2024-06-26 DIAGNOSIS — I471 Supraventricular tachycardia, unspecified: Secondary | ICD-10-CM

## 2024-06-26 DIAGNOSIS — R002 Palpitations: Secondary | ICD-10-CM

## 2024-06-26 NOTE — Telephone Encounter (Signed)
 Salonpas  is fine

## 2024-06-26 NOTE — Telephone Encounter (Signed)
 Spoke to [employer] (daughter), per signed dpr. She is going to get the [employer].

## 2024-07-02 DIAGNOSIS — L570 Actinic keratosis: Secondary | ICD-10-CM | POA: Diagnosis not present

## 2024-07-02 DIAGNOSIS — Z1283 Encounter for screening for malignant neoplasm of skin: Secondary | ICD-10-CM | POA: Diagnosis not present

## 2024-07-02 DIAGNOSIS — L820 Inflamed seborrheic keratosis: Secondary | ICD-10-CM | POA: Diagnosis not present

## 2024-07-02 DIAGNOSIS — C4441 Basal cell carcinoma of skin of scalp and neck: Secondary | ICD-10-CM | POA: Diagnosis not present

## 2024-07-02 DIAGNOSIS — D225 Melanocytic nevi of trunk: Secondary | ICD-10-CM | POA: Diagnosis not present

## 2024-07-02 DIAGNOSIS — X32XXXD Exposure to sunlight, subsequent encounter: Secondary | ICD-10-CM | POA: Diagnosis not present

## 2024-07-10 ENCOUNTER — Ambulatory Visit: Payer: Medicare Other | Admitting: Neurology

## 2024-07-14 DIAGNOSIS — R002 Palpitations: Secondary | ICD-10-CM | POA: Diagnosis not present

## 2024-07-24 ENCOUNTER — Other Ambulatory Visit: Payer: Self-pay | Admitting: Internal Medicine

## 2024-07-24 DIAGNOSIS — K5909 Other constipation: Secondary | ICD-10-CM | POA: Diagnosis not present

## 2024-07-24 DIAGNOSIS — R1010 Upper abdominal pain, unspecified: Secondary | ICD-10-CM

## 2024-07-24 DIAGNOSIS — K449 Diaphragmatic hernia without obstruction or gangrene: Secondary | ICD-10-CM | POA: Diagnosis not present

## 2024-07-30 ENCOUNTER — Other Ambulatory Visit

## 2024-08-06 DIAGNOSIS — Z08 Encounter for follow-up examination after completed treatment for malignant neoplasm: Secondary | ICD-10-CM | POA: Diagnosis not present

## 2024-08-06 DIAGNOSIS — L720 Epidermal cyst: Secondary | ICD-10-CM | POA: Diagnosis not present

## 2024-08-06 DIAGNOSIS — Z85828 Personal history of other malignant neoplasm of skin: Secondary | ICD-10-CM | POA: Diagnosis not present

## 2024-08-14 ENCOUNTER — Encounter: Payer: Self-pay | Admitting: Family Medicine

## 2024-08-14 DIAGNOSIS — I10 Essential (primary) hypertension: Secondary | ICD-10-CM

## 2024-08-14 NOTE — Telephone Encounter (Signed)
 Would you be so kind as to check into this referral for her?

## 2024-08-17 ENCOUNTER — Other Ambulatory Visit: Payer: Self-pay | Admitting: *Deleted

## 2024-08-17 DIAGNOSIS — I10 Essential (primary) hypertension: Secondary | ICD-10-CM

## 2024-08-17 NOTE — Telephone Encounter (Signed)
 yes

## 2024-08-18 MED ORDER — OMEPRAZOLE 20 MG PO CPDR: 20.0000 mg | DELAYED_RELEASE_CAPSULE | Freq: Every day | ORAL | 0 refills | Status: DC

## 2024-08-18 MED ORDER — ACETAMINOPHEN 325 MG PO TABS: 650.0000 mg | ORAL_TABLET | Freq: Two times a day (BID) | ORAL | 0 refills | Status: DC

## 2024-08-18 MED ORDER — SACCHAROMYCES BOULARDII 250 MG PO CAPS: 250.0000 mg | ORAL_CAPSULE | Freq: Two times a day (BID) | ORAL | 0 refills | Status: DC

## 2024-08-18 MED ORDER — THERA VITAL M PO TABS: 1.0000 | ORAL_TABLET | Freq: Every day | ORAL | 0 refills | Status: AC

## 2024-08-18 MED ORDER — MAGNESIUM OXIDE -MG SUPPLEMENT 400 (240 MG) MG PO TABS: 400.0000 mg | ORAL_TABLET | Freq: Every day | ORAL | 0 refills | Status: DC

## 2024-08-18 MED ORDER — FUROSEMIDE 20 MG PO TABS: ORAL_TABLET | ORAL | 0 refills | Status: DC

## 2024-08-27 DIAGNOSIS — M25552 Pain in left hip: Secondary | ICD-10-CM | POA: Diagnosis not present

## 2024-08-27 DIAGNOSIS — M25572 Pain in left ankle and joints of left foot: Secondary | ICD-10-CM | POA: Diagnosis not present

## 2024-08-27 DIAGNOSIS — M25551 Pain in right hip: Secondary | ICD-10-CM | POA: Diagnosis not present

## 2024-08-27 DIAGNOSIS — M25571 Pain in right ankle and joints of right foot: Secondary | ICD-10-CM | POA: Diagnosis not present

## 2024-08-31 ENCOUNTER — Encounter: Payer: Self-pay | Admitting: Podiatry

## 2024-08-31 ENCOUNTER — Ambulatory Visit: Admitting: Podiatry

## 2024-08-31 VITALS — Ht <= 58 in | Wt 132.0 lb

## 2024-08-31 DIAGNOSIS — M79674 Pain in right toe(s): Secondary | ICD-10-CM | POA: Diagnosis not present

## 2024-08-31 DIAGNOSIS — M79675 Pain in left toe(s): Secondary | ICD-10-CM

## 2024-08-31 DIAGNOSIS — B351 Tinea unguium: Secondary | ICD-10-CM

## 2024-09-01 NOTE — Progress Notes (Signed)
   Chief Complaint  Patient presents with   Nail Problem    Pt is here due to left 3rd toe states the toe rubbed against shoe and has been bothering her off and since, states no pain at the moment.    HPI: 89 y.o. female presenting today with her daughter for evaluation of concern to the 2nd and 3rd toenails of the right foot.  No history of injury but they have noticed some discoloration and that the toenail is loosely adhered.  Past Medical History:  Diagnosis Date   GERD (gastroesophageal reflux disease)    Hypertension    Seizure (HCC)    Thyroid  disease     Past Surgical History:  Procedure Laterality Date   ABDOMINAL HYSTERECTOMY     GALLBLADDER SURGERY      Allergies  Allergen Reactions   Sulfa Antibiotics Shortness Of Breath   Lorazepam  Other (See Comments)    Restless, fidgety   Other    Darvon [Propoxyphene] Hives     Physical Exam: General: The patient is alert and oriented x3 in no acute distress.  Dermatology: Skin is warm, dry and supple bilateral lower extremities.  Dystrophic nail noted to the right foot 2nd and 3rd digit with dried subungual bleeding and debris.  After debridement there is no open wound.  Clinically there is no indication of infection  Vascular: Palpable pedal pulses bilaterally. Capillary refill within normal limits.  No appreciable edema.  No erythema.  Neurological: Grossly intact via light touch  Musculoskeletal Exam: No pedal deformities noted.  Patient is ambulatory with the assistance of a cane   Assessment/Plan of Care: 1.  Dystrophic toenail right second digit  -Patient evaluated -Light debridement of the toenail was performed today using a tissue nipper without incident or bleeding.  There was some dried subungual blood underneath the nail plate but this was debrided and there is no open wound noted. -Recommend good supportive tennis shoes and sneakers -Return to clinic next scheduled appointment for routine  footcare       Thresa EMERSON Sar, DPM [facility]  Dr. Thresa EMERSON Sar, DPM    [address] [place], [place] [address]                Office [phone]  Fax [phone]

## 2024-09-11 ENCOUNTER — Other Ambulatory Visit: Payer: Self-pay | Admitting: Family Medicine

## 2024-09-11 ENCOUNTER — Encounter: Payer: Self-pay | Admitting: Family Medicine

## 2024-09-11 DIAGNOSIS — K449 Diaphragmatic hernia without obstruction or gangrene: Secondary | ICD-10-CM

## 2024-09-11 DIAGNOSIS — K5909 Other constipation: Secondary | ICD-10-CM

## 2024-09-11 MED ORDER — OMEPRAZOLE 40 MG PO CPDR: 40.0000 mg | DELAYED_RELEASE_CAPSULE | Freq: Every day | ORAL | 3 refills | Status: AC

## 2024-09-11 MED ORDER — TRULANCE 3 MG PO TABS: 3.0000 mg | ORAL_TABLET | Freq: Every day | ORAL | 3 refills | Status: DC

## 2024-09-11 NOTE — Telephone Encounter (Signed)
 It was in media. She goes to [place].  Meds sent to [facility]

## 2024-09-11 NOTE — Telephone Encounter (Signed)
 I can't see any OV notes from GI-didn't know if you received them. Ok to fill 40mg ? I did pend it if you would like to send it in.

## 2024-09-21 ENCOUNTER — Ambulatory Visit: Admitting: Podiatry

## 2024-10-01 ENCOUNTER — Other Ambulatory Visit: Payer: Self-pay | Admitting: Family Medicine

## 2024-10-07 ENCOUNTER — Other Ambulatory Visit (HOSPITAL_BASED_OUTPATIENT_CLINIC_OR_DEPARTMENT_OTHER): Payer: Self-pay

## 2024-10-07 MED ORDER — FLUZONE HIGH-DOSE 0.5 ML IM SUSY
0.5000 mL | PREFILLED_SYRINGE | Freq: Once | INTRAMUSCULAR | 0 refills | Status: AC
  Filled 2024-10-07: qty 0.5, 1d supply, fill #0

## 2024-10-08 ENCOUNTER — Ambulatory Visit

## 2024-10-08 VITALS — BP 146/84 | HR 73 | Ht <= 58 in | Wt 134.9 lb

## 2024-10-08 DIAGNOSIS — R002 Palpitations: Secondary | ICD-10-CM

## 2024-10-08 DIAGNOSIS — I471 Supraventricular tachycardia, unspecified: Secondary | ICD-10-CM

## 2024-10-08 DIAGNOSIS — I10 Essential (primary) hypertension: Secondary | ICD-10-CM

## 2024-10-08 MED ORDER — METOPROLOL SUCCINATE ER 25 MG PO TB24: 12.5000 mg | ORAL_TABLET | Freq: Every day | ORAL | 2 refills | Status: DC

## 2024-10-08 NOTE — Patient Instructions (Addendum)
 Medication Instructions:  Your physician has recommended you make the following change in your medication:  START Metoprolol Succinate (Toprol) 12.5 mg daily  *If you need a refill on your cardiac medications before your next appointment, please call your pharmacy*  Lab Work: None ordered  If you have any lab test that is abnormal or we need to change your treatment, we will call you to review the results.  Testing/Procedures: Your physician has requested that you have an echocardiogram. Echocardiography is a painless test that uses sound waves to create images of your heart. It provides your doctor with information about the size and shape of your heart and how well your heart's chambers and valves are working. This procedure takes approximately one hour. There are no restrictions for this procedure. Please do NOT wear cologne, perfume, aftershave, or lotions (deodorant is allowed). Please arrive 15 minutes prior to your appointment time.  Please note: We ask at that you not bring children with you during ultrasound (echo/ vascular) testing. Due to room size and safety concerns, children are not allowed in the ultrasound rooms during exams. Our front office staff cannot provide observation of children in our lobby area while testing is being conducted. An adult accompanying a patient to their appointment will only be allowed in the ultrasound room at the discretion of the ultrasound technician under special circumstances. We apologize for any inconvenience.   Follow-Up: At [facility], you and your health needs are our priority.  As part of our continuing mission to provide you with exceptional heart care, our providers are all part of one team.  This team includes your primary Cardiologist (physician) and Advanced Practice Providers or APPs (Physician Assistants and Nurse Practitioners) who all work together to provide you with the care you need, when you need it.  Your next  appointment:   1 month(s)  Provider:   Dr. Azobou   Thank you for choosing [facility]!!   [phone]

## 2024-10-08 NOTE — Progress Notes (Signed)
 Cardiology Office Note Date:  10/08/2024  ID:  Samantha Woods, DOB Jun 21, 1921, MRN [id number] PCP:  Samantha Norene HERO, DO  Cardiologist:   Samantha VEAR Ren Donley, MD  Chief Complaint  Patient presents with   Irregular Heart Beat      Problems Irregular heart beats on BP machine- TSH N 14 days zio with 227 episodes of SVT- longest lasting 22.6 sec 3.5% burden HTN LE edema M: AE5, FE10  Visits  11/25: XL12.5 daily, TTE    History of Present Illness: Samantha Woods is a 89 y.o. female who presents for new visit.   Per the daughter, shortly after receiving the COVID-vaccine back in January 2022, patient started having irregular heartbeats on blood pressure check.  Back in August of this year, she had a event monitor that showed multiple episode of SVT.  She denies any chest palpitations but does report worsening dyspnea over the last couple of weeks.  She denies any lower extremity edema, PND, or orthopnea.  She denies any chest pain but does have some MSK pain in bilateral shoulders.   ROS: Please see the history of present illness. All other systems are reviewed and negative.   Past Medical History:  Diagnosis Date   GERD (gastroesophageal reflux disease)    Hypertension    Seizure (HCC)    Thyroid  disease     Past Surgical History:  Procedure Laterality Date   ABDOMINAL HYSTERECTOMY     GALLBLADDER SURGERY      Current Outpatient Medications  Medication Sig Dispense Refill   acetaminophen  (TYLENOL ) 325 MG tablet TAKE 2 TABLETS BY MOUTH TWICE DAILY. 120 tablet PRN   azelastine  (ASTELIN ) 0.1 % nasal spray Place 1 spray into both nostrils 2 (two) times daily as needed for rhinitis. 30 mL 5   celecoxib  (CELEBREX ) 100 MG capsule TAKE 1 CAPSULE BY MOUTH TWICE DAILY. 60 capsule 3   diclofenac  Sodium (GOODSENSE ARTHRITIS PAIN) 1 % GEL Apply 2 g topically 4 (four) times daily. 200 g 3   docusate sodium  (COLACE) 100 MG capsule TAKE (1) CAPSULE BY MOUTH TWICE DAILY FOR  MILD CONSTIPATION. 60 capsule PRN   FLORASTOR 250 MG capsule TAKE 1 CAPSULE BY MOUTH TWICE DAILY. 60 capsule PRN   furosemide  (LASIX ) 20 MG tablet TAKE (1/2) TABLET BY MOUTH ONCE DAILY. 45 tablet 0   ipratropium (ATROVENT ) 0.06 % nasal spray Place 1 spray into both nostrils 3 (three) times daily as needed (congestion,runny nose). 15 mL 5   levETIRAcetam  (KEPPRA ) 500 MG tablet Take 1 tablet (500 mg total) by mouth 2 (two) times daily. 180 tablet 4   levothyroxine  (SYNTHROID ) 75 MCG tablet TAKE (1) TABLET BY MOUTH ONCE DAILY BEFORE BREAKFAST. 30 tablet 7   loratadine  (CLARITIN ) 10 MG tablet Take 1 tablet (10 mg total) by mouth daily. 100 tablet 3   magnesium  oxide (MAG-OX) 400 (240 Mg) MG tablet Take 1 tablet (400 mg total) by mouth daily. 30 tablet 0   Menthol -Methyl Salicylate  (MUSCLE RUB) 10-15 % CREA Apply 1 application topically 2 (two) times daily as needed for muscle pain.  0   Misc. Devices (TRANSFER BENCH) MISC UAD for mobility assistance in shower. 1 each 0   Multiple Vitamins-Minerals (MULTIVITAMIN) tablet Take 1 tablet by mouth daily. 30 tablet 0   omeprazole  (PRILOSEC) 40 MG capsule Take 1 capsule (40 mg total) by mouth daily. 90 capsule 3   Plecanatide  (TRULANCE ) 3 MG TABS Take 1 tablet (3 mg total) by mouth daily. 90 tablet  3   triamcinolone (NASACORT ALLERGY  24HR) 55 MCG/ACT AERO nasal inhaler Place 2 sprays into the nose daily.     Influenza vac split trivalent PF (FLUZONE HIGH-DOSE) 0.5 ML injection Inject 0.5 mLs into the muscle once for 1 dose. (Patient not taking: Reported on 10/08/2024) 0.5 mL 0   No current facility-administered medications for this visit.    Allergies:   Sulfa antibiotics, Lorazepam , Other, and Darvon [propoxyphene]   Social History:  see above  Family History:  see above  PHYSICAL EXAM: VS:  BP (!) 180/80 (BP Location: Right Arm, Patient Position: Sitting, Cuff Size: Normal)   Pulse 73   Ht 4' 8 (1.422 m)   Wt 134 lb 14.4 oz (61.2 kg)   SpO2 93%    BMI 30.24 kg/m  , BMI Body mass index is 30.24 kg/m. GEN: Well nourished, well developed, in no acute distress HEENT: normal Neck: no JVD, carotid bruits, or masses Cardiac: RRR; no murmurs, rubs, or gallops,no edema  Respiratory:  CTAB bilaterally, normal work of breathing GI: soft, nontender, nondistended, + BS Extremities: No LE edema Skin: warm and dry, no rash Neuro:  Strength and sensation are intact  EKG: NSR  Recent Labs: Reviewed  Studies: Reviewed  ASSESSMENT AND PLAN: Samantha Woods is a 89 y.o. female who presents for new visit.  - Regarding her SVT, to be contributing to her worsening dyspnea.  Will start metoprolol XL 12.5 daily and obtain a 2D echocardiogram. - Will see her back in 1 month to assess her symptom burden.   Signed, Samantha VEAR Ren Donley, MD  10/08/2024 3:16 PM    [facility]

## 2024-10-12 ENCOUNTER — Ambulatory Visit (INDEPENDENT_AMBULATORY_CARE_PROVIDER_SITE_OTHER): Admitting: Podiatry

## 2024-10-12 ENCOUNTER — Encounter: Payer: Self-pay | Admitting: Podiatry

## 2024-10-12 VITALS — Ht <= 58 in | Wt 134.9 lb

## 2024-10-12 DIAGNOSIS — M19071 Primary osteoarthritis, right ankle and foot: Secondary | ICD-10-CM | POA: Diagnosis not present

## 2024-10-12 DIAGNOSIS — M19072 Primary osteoarthritis, left ankle and foot: Secondary | ICD-10-CM

## 2024-10-12 MED ORDER — BETAMETHASONE SOD PHOS & ACET 6 (3-3) MG/ML IJ SUSP
3.0000 mg | Freq: Once | INTRAMUSCULAR | Status: AC
  Administered 2024-10-12: 3 mg via INTRA_ARTICULAR

## 2024-10-12 NOTE — Progress Notes (Signed)
   Chief Complaint  Patient presents with   Ankle Pain    Pt is here to receive injection to bilateral ankles due to pain.     Subjective:  89 y.o. female presenting today for evaluation of bilateral ankle pain ongoing for several years.  Most exacerbated at nighttime.  She states that she has a history of arthritis to the ankles   Past Medical History:  Diagnosis Date   GERD (gastroesophageal reflux disease)    Hypertension    Seizure (HCC)    Thyroid  disease     Past Surgical History:  Procedure Laterality Date   ABDOMINAL HYSTERECTOMY     GALLBLADDER SURGERY      Allergies  Allergen Reactions   Sulfa Antibiotics Shortness Of Breath   Lorazepam  Other (See Comments)    Restless, fidgety   Other    Darvon [Propoxyphene] Hives    Objective / Physical Exam:  General:  The patient is alert and oriented x3 in no acute distress. Dermatology:  Skin is warm, dry and supple bilateral lower extremities. Negative for open lesions or macerations. Vascular:  Palpable pedal pulses bilaterally. No edema or erythema noted. Capillary refill within normal limits. Neurological:  Grossly intact via light touch Musculoskeletal Exam:  Pain on palpation to the anterior lateral medial aspects of the patient's bilateral ankles. Mild edema noted. Range of motion within normal limits to all pedal and ankle joints bilateral. Muscle strength 5/5 in all groups bilateral.    Assessment/plan of care:  Arthritis of right ankle Arthritis of left ankle  -Patient evaluated -Injection of 0.5 cc Celestone  Soluspan injected into the anteromedial aspect of the bilateral ankles -Continue wearing good supportive tennis shoes and sneakers -Continue conservative treatment and management -Return to clinic 3 months   Thresa EMERSON Sar, DPM [facility]  Dr. Thresa EMERSON Sar, DPM    [address] [place], [place] [address]                Office [phone]  Fax [phone]

## 2024-10-14 ENCOUNTER — Encounter: Payer: Self-pay | Admitting: Neurology

## 2024-10-27 ENCOUNTER — Ambulatory Visit

## 2024-10-27 DIAGNOSIS — R002 Palpitations: Secondary | ICD-10-CM

## 2024-10-28 LAB — ECHOCARDIOGRAM COMPLETE
AR max vel: 1.86 cm2
AV Area VTI: 2.28 cm2
AV Area mean vel: 2.1 cm2
AV Mean grad: 8 mmHg
AV Peak grad: 17.5 mmHg
Ao pk vel: 2.09 m/s
Area-P 1/2: 3.4 cm2
Calc EF: 57.2 %
MV VTI: 2.97 cm2
P 1/2 time: 759 ms
S' Lateral: 2.5 cm
Single Plane A2C EF: 62.8 %
Single Plane A4C EF: 56.2 %

## 2024-10-29 ENCOUNTER — Ambulatory Visit: Payer: Self-pay

## 2024-11-09 ENCOUNTER — Ambulatory Visit: Admitting: Family Medicine

## 2024-11-09 VITALS — BP 140/72 | HR 63 | Temp 97.6°F | Ht <= 58 in | Wt 133.1 lb

## 2024-11-09 DIAGNOSIS — H578A2 Foreign body sensation, left eye: Secondary | ICD-10-CM | POA: Diagnosis not present

## 2024-11-09 DIAGNOSIS — H6120 Impacted cerumen, unspecified ear: Secondary | ICD-10-CM | POA: Diagnosis not present

## 2024-11-09 DIAGNOSIS — R5382 Chronic fatigue, unspecified: Secondary | ICD-10-CM
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
## 2024-11-12 NOTE — Progress Notes (Unsigned)
°   °  °  Cardiology Office Note Date:  11/12/2024  ID:  Samantha Woods, DOB [birth year], MRN [id number] PCP:  Jolinda Norene HERO, DO  Cardiologist:   Joelle VEAR Ren Donley, MD  No chief complaint on file.    Problems Irregular heart beats on BP machine- TSH N 14 days zio with 227 episodes of SVT- longest lasting 22.6 sec 3.5% burden TTE 12/25 60-65% HTN LE edema M: AE5, FE10, XL12/5  Visits  11/25: XL12.5 daily, TTE 12/25: EM 72 hrs     History of Present Illness: Samantha Woods is a 89 y.o. female who presents for follow up.   ROS: Otherwise negative  Physical Exam VS:  There were no vitals taken for this visit. , BMI There is no height or weight on file to calculate BMI. GEN: Well nourished, well developed, in no acute distress HEENT: normal Neck: no JVD, carotid bruits, or masses Cardiac: ***RRR; no murmurs, rubs, or gallops,no edema  Respiratory:  CTAB bilaterally, normal work of breathing GI: soft, nontender, nondistended, + BS Extremities: No LE edema Skin: warm and dry, no rash Neuro:  Strength and sensation are intact  Recent Labs: Reviewed  ASSESSMENT AND PLAN Samantha Woods is a 89 y.o. female who presents for follow up.  - *** - *** - ***    Signed, Joelle VEAR Ren Donley, MD  11/12/2024 3:19 PM    [facility]

## 2024-11-13 ENCOUNTER — Ambulatory Visit

## 2024-11-13 VITALS — BP 110/62 | HR 68 | Ht <= 58 in | Wt 134.7 lb

## 2024-11-13 DIAGNOSIS — I10 Essential (primary) hypertension: Secondary | ICD-10-CM

## 2024-11-13 DIAGNOSIS — R002 Palpitations: Secondary | ICD-10-CM

## 2024-11-13 NOTE — Patient Instructions (Signed)
 Medication Instructions:  STOP Metoprolol  Succinate (Toprol  XL) *If you need a refill on your cardiac medications before your next appointment, please call your pharmacy*  Lab Work: None ordered If you have labs (blood work) drawn today and your tests are completely normal, you will receive your results only by: MyChart Message (if you have MyChart) OR A paper copy in the mail If you have any lab test that is abnormal or we need to change your treatment, we will call you to review the results.  Testing/Procedures: None ordered  Follow-Up: At [facility], you and your health needs are our priority.  As part of our continuing mission to provide you with exceptional heart care, our providers are all part of one team.  This team includes your primary Cardiologist (physician) and Advanced Practice Providers or APPs (Physician Assistants and Nurse Practitioners) who all work together to provide you with the care you need, when you need it.  Your next appointment:   6 month(s)  Provider:   One of our Advanced Practice Providers (APPs): Morse Clause, PA-C  Lamarr Satterfield, NP Miriam Shams, NP  Olivia Pavy, PA-C Josefa Beauvais, NP  Leontine Salen, PA-C Orren Fabry, PA-C  Hao Meng, PA-C Ernest Dick, NP  Damien Braver, NP Jon Hails, PA-C  Waddell Donath, PA-C    Dayna Dunn, PA-C  Scott Weaver, PA-C Lum Louis, NP Katlyn West, NP Callie Goodrich, PA-C  Xika Zhao, NP Sheng Haley, PA-C    Kathleen Johnson, PA-C       We recommend signing up for the patient portal called MyChart.  Sign up information is provided on this After Visit Summary.  MyChart is used to connect with patients for Virtual Visits (Telemedicine).  Patients are able to view lab/test results, encounter notes, upcoming appointments, etc.  Non-urgent messages can be sent to your provider as well.   To learn more about what you can do with MyChart, go to [email or web address].

## 2024-11-24 ENCOUNTER — Encounter (INDEPENDENT_AMBULATORY_CARE_PROVIDER_SITE_OTHER): Payer: Self-pay | Admitting: Family Medicine

## 2024-11-24 DIAGNOSIS — K5909 Other constipation: Secondary | ICD-10-CM

## 2024-11-24 MED ORDER — LINACLOTIDE 145 MCG PO CAPS: 145.0000 ug | ORAL_CAPSULE | Freq: Every day | ORAL | 12 refills | Status: AC

## 2024-11-24 NOTE — Telephone Encounter (Signed)

## 2024-11-27 ENCOUNTER — Other Ambulatory Visit: Payer: Self-pay | Admitting: Family Medicine

## 2024-11-27 DIAGNOSIS — I10 Essential (primary) hypertension: Secondary | ICD-10-CM

## 2024-12-02 ENCOUNTER — Ambulatory Visit (INDEPENDENT_AMBULATORY_CARE_PROVIDER_SITE_OTHER): Admitting: Family Medicine

## 2024-12-02 ENCOUNTER — Encounter: Payer: Self-pay | Admitting: Family Medicine

## 2024-12-02 VITALS — BP 145/72 | HR 67 | Temp 97.9°F | Ht <= 58 in | Wt 133.4 lb

## 2024-12-02 DIAGNOSIS — H578A2 Foreign body sensation, left eye: Secondary | ICD-10-CM

## 2024-12-02 DIAGNOSIS — K5909 Other constipation: Secondary | ICD-10-CM

## 2024-12-02 DIAGNOSIS — I10 Essential (primary) hypertension: Secondary | ICD-10-CM | POA: Diagnosis not present

## 2024-12-02 DIAGNOSIS — E039 Hypothyroidism, unspecified: Secondary | ICD-10-CM

## 2024-12-02 NOTE — Progress Notes (Signed)
 "  Subjective: CC: Follow-up constipation PCP: Jolinda Norene HERO, DO YEP:Samantha Woods is a 89 y.o. female presenting to clinic today for:  Patient is brought to the office by her daughter who reports that constipation has been stable.  She has been utilizing Linzess  145 mcg daily and it is not because the GI disturbance that the Trulance  was causing.  So far tolerating this without any difficulty.  Her main concern today is that she is continue to have left eye foreign body sensation.  She was seen for this on 11/09/2024 and was later seen by optometry, who diagnosed her with an eye infection and dry eye.  She was started on Restasis twice daily and has been utilizing that since that visit.  She has not followed up with her regarding recurrent eye irritation but notes that when she was on the antibiotic eyedrops symptoms seem to get better.  Reports no ocular pain or visual disturbance other than occasional floaters   ROS: Per HPI  Allergies[1] Past Medical History:  Diagnosis Date   GERD (gastroesophageal reflux disease)    Hypertension    Seizure (HCC)    Thyroid  disease    Current Medications[2] Social History   Socioeconomic History   Marital status: Widowed    Spouse name: Not on file   Number of children: Not on file   Years of education: Not on file   Highest education level: 12th grade  Occupational History   Not on file  Tobacco Use   Smoking status: Never   Smokeless tobacco: Never  Vaping Use   Vaping status: Never Used  Substance and Sexual Activity   Alcohol use: Not Currently    Comment: occasional   Drug use: No   Sexual activity: Not on file  Other Topics Concern   Not on file  Social History Narrative   Pt lives in a 1 story home with her son and his wife one level   Has 3 adult children   High School Graduate   Retired from [employer]    Caffeine 4 cups daily   Social Drivers of Health   Tobacco Use: Low Risk (12/02/2024)    Patient History    Smoking Tobacco Use: Never    Smokeless Tobacco Use: Never    Passive Exposure: Not on file  Financial Resource Strain: Low Risk (11/09/2024)   Overall Financial Resource Strain (CARDIA)    Difficulty of Paying Living Expenses: Not hard at all  Food Insecurity: No Food Insecurity (11/09/2024)   Epic    Worried About Radiation Protection Practitioner of Food in the Last Year: Never true    Ran Out of Food in the Last Year: Never true  Transportation Needs: No Transportation Needs (11/09/2024)   Epic    Lack of Transportation (Medical): No    Lack of Transportation (Non-Medical): No  Physical Activity: Inactive (11/09/2024)   Exercise Vital Sign    Days of Exercise per Week: 0 days    Minutes of Exercise per Session: Not on file  Stress: No Stress Concern Present (11/09/2024)   [employer] of Occupational Health - Occupational Stress Questionnaire    Feeling of Stress: Not at all  Social Connections: Moderately Isolated (11/09/2024)   Social Connection and Isolation Panel    Frequency of Communication with Friends and Family: More than three times a week    Frequency of Social Gatherings with Friends and Family: Once a week    Attends Religious Services: 1 to 4 times per  year    Active Member of Clubs or Organizations: No    Attends Banker Meetings: Not on file    Marital Status: Widowed  Intimate Partner Violence: Not on file  Depression (PHQ2-9): Low Risk (11/09/2024)   Depression (PHQ2-9)    PHQ-2 Score: 3  Alcohol Screen: Not on file  Housing: Unknown (11/09/2024)   Epic    Unable to Pay for Housing in the Last Year: Not on file    Number of Times Moved in the Last Year: 0    Homeless in the Last Year: No  Utilities: Not on file  Health Literacy: Not on file   Family History  Problem Relation Age of Onset   Heart disease Mother     Objective: Office vital signs reviewed. BP (!) 145/72   Pulse 67   Temp 97.9 F (36.6 C)   Ht 4' 8 (1.422 m)    Wt 133 lb 6 oz (60.5 kg)   SpO2 96%   BMI 29.90 kg/m   Physical Examination:  General: Awake, alert, well nourished, No acute distress HEENT: Sclera white.  She has senile scleral plaques appreciated bilaterally.  No corneal abrasion appreciated or foreign body noted.  No exophthalmos. Cardio: regular rate and rhythm, S1S2 heard, no murmurs appreciated Pulm: clear to auscultation bilaterally, no wheezes, rhonchi or rales; normal work of breathing on room air    Assessment/ Plan: 89 y.o. female   Chronic constipation  Foreign body sensation, left eye  Acquired hypothyroidism - Plan: TSH + free T4  Essential hypertension - Plan: Basic Metabolic Panel   Constipation doing well so far with Linzess  with no adverse side effects  No evidence of abnormality on eye exam today.  I am glad to renew that antibiotic to see if perhaps that will resolve issue but would certainly encourage her to follow-up with Dr. Jama if symptoms are recurrent.  Continue Restasis.  Perhaps she just needs longer on the medication and symptoms are currently reflective of ongoing dry eye  Will check thyroid  levels and BMP today as she was scheduled for this on the 30th.  These issues have been stable.   Norene CHRISTELLA Fielding, DO [facility] [phone]     [1]  Allergies Allergen Reactions   Sulfa Antibiotics Shortness Of Breath   Lorazepam  Other (See Comments)    Restless, fidgety   Other    Propoxyphene Hives  [2]  Current Outpatient Medications:    acetaminophen  (TYLENOL ) 325 MG tablet, TAKE 2 TABLETS BY MOUTH TWICE DAILY., Disp: 120 tablet, Rfl: PRN   celecoxib  (CELEBREX ) 100 MG capsule, TAKE 1 CAPSULE BY MOUTH TWICE DAILY., Disp: 60 capsule, Rfl: PRN   diclofenac  Sodium (GOODSENSE ARTHRITIS PAIN) 1 % GEL, Apply 2 g topically 4 (four) times daily., Disp: 200 g, Rfl: 3   docusate sodium  (COLACE) 100 MG capsule, TAKE (1) CAPSULE BY MOUTH TWICE DAILY FOR MILD CONSTIPATION.,  Disp: 60 capsule, Rfl: PRN   FLORASTOR 250 MG capsule, TAKE 1 CAPSULE BY MOUTH TWICE DAILY., Disp: 60 capsule, Rfl: PRN   furosemide  (LASIX ) 20 MG tablet, TAKE (1/2) TABLET BY MOUTH ONCE DAILY., Disp: 45 tablet, Rfl: PRN   levETIRAcetam  (KEPPRA ) 500 MG tablet, Take 1 tablet (500 mg total) by mouth 2 (two) times daily., Disp: 180 tablet, Rfl: 4   levothyroxine  (SYNTHROID ) 75 MCG tablet, TAKE (1) TABLET BY MOUTH ONCE DAILY BEFORE BREAKFAST., Disp: 30 tablet, Rfl: 7   linaclotide  (LINZESS ) 145 MCG CAPS capsule, Take  1 capsule (145 mcg total) by mouth daily before breakfast. Failed Miralax , Trulance , OTC stool softeners, Disp: 30 capsule, Rfl: 12   loratadine  (CLARITIN ) 10 MG tablet, Take 1 tablet (10 mg total) by mouth daily., Disp: 100 tablet, Rfl: 3   magnesium  oxide (MAG-OX) 400 (240 Mg) MG tablet, TAKE (1/2) TABLET (=200MG ) BY MOUTH TWICE DAILY., Disp: 30 tablet, Rfl: PRN   Menthol -Methyl Salicylate  (MUSCLE RUB) 10-15 % CREA, Apply 1 application topically 2 (two) times daily as needed for muscle pain., Disp: , Rfl: 0   Multiple Vitamins-Minerals (MULTIVITAMIN) tablet, Take 1 tablet by mouth daily., Disp: 30 tablet, Rfl: 0   omeprazole  (PRILOSEC) 40 MG capsule, Take 1 capsule (40 mg total) by mouth daily., Disp: 90 capsule, Rfl: 3   polyethylene glycol powder (MIRALAX ) 17 GM/SCOOP powder, Take 17 g by mouth daily. Dissolve 1 capful (17g) in 4-8 ounces of liquid and take by mouth daily., Disp: , Rfl:    Misc. Devices (TRANSFER BENCH) MISC, UAD for mobility assistance in shower. (Patient not taking: Reported on 12/02/2024), Disp: 1 each, Rfl: 0  "

## 2024-12-03 LAB — BASIC METABOLIC PANEL WITH GFR
BUN/Creatinine Ratio: 11 — ABNORMAL LOW (ref 12–28)
BUN: 9 mg/dL — ABNORMAL LOW (ref 10–36)
CO2: 21 mmol/L (ref 20–29)
Calcium: 9.6 mg/dL (ref 8.7–10.3)
Chloride: 101 mmol/L (ref 96–106)
Creatinine, Ser: 0.8 mg/dL (ref 0.57–1.00)
Glucose: 66 mg/dL — ABNORMAL LOW (ref 70–99)
Potassium: 4.6 mmol/L (ref 3.5–5.2)
Sodium: 142 mmol/L (ref 134–144)
eGFR: 65 mL/min/1.73

## 2024-12-03 LAB — TSH+FREE T4
Free T4: 1.26 ng/dL (ref 0.82–1.77)
TSH: 2.25 u[IU]/mL (ref 0.450–4.500)

## 2024-12-04 ENCOUNTER — Ambulatory Visit: Payer: Self-pay | Admitting: Family Medicine

## 2024-12-25 ENCOUNTER — Ambulatory Visit: Payer: Self-pay | Admitting: Family Medicine

## 2024-12-30 ENCOUNTER — Ambulatory Visit: Admitting: Podiatry

## 2025-01-11 ENCOUNTER — Ambulatory Visit: Admitting: Podiatry

## 2025-06-08 ENCOUNTER — Other Ambulatory Visit

## 2025-06-11 ENCOUNTER — Ambulatory Visit: Admitting: Family Medicine

## 2025-06-25 ENCOUNTER — Ambulatory Visit: Admitting: Neurology

## 2025-06-28 ENCOUNTER — Ambulatory Visit: Admitting: Neurology
